# Patient Record
Sex: Male | Born: 1950 | Race: White | Hispanic: No | Marital: Married
Health system: Midwestern US, Community
[De-identification: ages and names within clinical notes are randomized; demographics above are authoritative.]

## PROBLEM LIST (undated history)

## (undated) DIAGNOSIS — M549 Dorsalgia, unspecified: Secondary | ICD-10-CM

## (undated) DIAGNOSIS — M545 Low back pain, unspecified: Secondary | ICD-10-CM

## (undated) DIAGNOSIS — S4991XA Unspecified injury of right shoulder and upper arm, initial encounter: Secondary | ICD-10-CM

## (undated) DIAGNOSIS — M1712 Unilateral primary osteoarthritis, left knee: Secondary | ICD-10-CM

## (undated) DIAGNOSIS — R079 Chest pain, unspecified: Secondary | ICD-10-CM

## (undated) DIAGNOSIS — E039 Hypothyroidism, unspecified: Secondary | ICD-10-CM

## (undated) DIAGNOSIS — M75100 Unspecified rotator cuff tear or rupture of unspecified shoulder, not specified as traumatic: Secondary | ICD-10-CM

## (undated) DIAGNOSIS — M25511 Pain in right shoulder: Secondary | ICD-10-CM

## (undated) DIAGNOSIS — N139 Obstructive and reflux uropathy, unspecified: Secondary | ICD-10-CM

---

## 2011-05-03 NOTE — ED Notes (Signed)
Pt has fever  Diarrhea  Right lower abd pain  achey all over sincwe Thursday no appetite. Pt placed in room 5

## 2011-05-03 NOTE — ED Notes (Signed)
Patient A+Ox3 in NAD, reporting pain in RLQ and diarrhea since Thursday. IV access established and pt medicated as ordered. IVF infusing. CXR done. Patient drinking PO contrast.

## 2011-05-03 NOTE — ED Provider Notes (Signed)
HPI Comments: RLQ Abd pain assoc with diarrhea    Patient is a 60 y.o. male presenting with fever. The history is provided by the patient.   Fever   This is a new problem. The current episode started 2 days ago. The problem has not changed since onset.Patient reports a subjective fever - was not measured.Associated symptoms include diarrhea, muscle aches and shortness of breath. Pertinent negatives include no chest pain, no vomiting, no congestion, no headaches, no sore throat, no cough, no neck pain, no rash and no urinary symptoms. He has tried acetaminophen and ibuprofen for the symptoms. The treatment provided no relief.        Past Medical History   Diagnosis Date   ??? CAD (coronary artery disease)      cholesterol        Past Surgical History   Procedure Date   ??? Hx urological    ??? Hx prostatectomy          No family history on file.     History     Social History   ??? Marital Status: Married     Spouse Name: N/A     Number of Children: N/A   ??? Years of Education: N/A     Occupational History   ??? Not on file.     Social History Main Topics   ??? Smoking status: Never Smoker    ??? Smokeless tobacco: Not on file   ??? Alcohol Use: Yes      soc   ??? Drug Use: No   ??? Sexually Active:      Other Topics Concern   ??? Not on file     Social History Narrative   ??? No narrative on file                  ALLERGIES: Review of patient's allergies indicates no known allergies.      Review of Systems   Constitutional: Positive for fever. Negative for chills and unexpected weight change.   HENT: Negative for congestion, sore throat, rhinorrhea, neck pain and neck stiffness.    Eyes: Negative for pain, redness and visual disturbance.   Respiratory: Positive for shortness of breath. Negative for cough and wheezing.    Cardiovascular: Negative for chest pain and palpitations.   Gastrointestinal: Positive for abdominal pain and diarrhea. Negative for nausea, vomiting and blood in stool.    Genitourinary: Negative for dysuria, hematuria and flank pain.   Musculoskeletal: Negative for back pain.   Skin: Negative for pallor and rash.   Neurological: Negative for syncope, light-headedness and headaches.   Psychiatric/Behavioral: Negative for confusion.       Filed Vitals:    05/03/11 1917   BP: 136/64   Pulse: 98   Temp: 101 ??F (38.3 ??C)   Resp: 18   Height: 5\' 9"  (1.753 m)   Weight: 63.504 kg (140 lb)   SpO2: 97%            Physical Exam   Nursing note and vitals reviewed.  Constitutional: He is oriented to person, place, and time. He appears well-developed and well-nourished. No distress.   HENT:   Head: Normocephalic and atraumatic.   Mouth/Throat: Oropharynx is clear and moist.   Eyes: EOM are normal. Pupils are equal, round, and reactive to light.   Neck: Neck supple. No JVD present.   Cardiovascular: Normal rate, regular rhythm and normal heart sounds.    No murmur heard.  Pulmonary/Chest: Breath sounds  normal. No respiratory distress. He has no wheezes. He has no rales.   Abdominal: Soft. Bowel sounds are normal. He exhibits no distension. There is tenderness (RLQ). There is guarding. There is no rebound.   Genitourinary: Rectum normal. Guaiac negative stool.   Musculoskeletal: Normal range of motion. He exhibits no edema and no tenderness.   Neurological: He is alert and oriented to person, place, and time. No cranial nerve deficit.   Skin: Skin is warm and dry. No rash noted. No pallor.   Psychiatric: He has a normal mood and affect.        MDM    Procedures    <EMERGENCY DEPARTMENT CASE SUMMARY>    Impression/Differential Diagnosis: Fever,Abd pain and Diarrhea    Plan: IVF/cultures/labs and CT Abd/Pelvis    ED Course: persistent pain RLQ  Labs reviewed:noted mild dehydration with elevated urine sp grav.  CT reveals sl thickening of Appendix and terminal Ileum without inflammation  12:30AM case discussed with Dr. Monna Fam recommends admit to med for observation     Final Impression/Diagnosis: Abdominal Pain/Diarrhea    Patient condition at time of disposition: guarded      I have reviewed the following home medications:    Prior to Admission medications    Medication Sig Start Date End Date Taking? Authorizing Provider   tamsulosin (FLOMAX) 0.4 mg capsule Take 0.4 mg by mouth daily.     Yes Phys Other, MD   acetaminophen (TYLENOL EXTRA STRENGTH) 500 mg tablet Take 1,000 mg by mouth every six (6) hours as needed.     Yes Phys Other, MD         Mikle Bosworth, MD

## 2011-05-03 NOTE — ED Notes (Signed)
Pt returned from ct  Second litre of n/s hung and infusing 150cc/hr. Pt feels a little better, trying to sleep in room 5

## 2011-05-03 NOTE — ED Notes (Signed)
Patient A+Ox3 in NAD, reports feeling less pain but still uncomfortable. Steady gait to bathroom.

## 2011-05-04 ENCOUNTER — Inpatient Hospital Stay
Admit: 2011-05-04 | Discharge: 2011-05-05 | Disposition: A | Discharge status: Home / Self Care | Source: Home / Self Care

## 2011-05-04 DIAGNOSIS — R1031 Right lower quadrant pain: Secondary | ICD-10-CM

## 2011-05-04 LAB — METABOLIC PANEL, BASIC
Anion gap: 5 mmol/L — ABNORMAL LOW (ref 10–20)
BUN: 22 mg/dL — ABNORMAL HIGH (ref 7–20)
CO2: 27 mmol/L (ref 21–32)
Calcium: 7.6 mg/dL — ABNORMAL LOW (ref 8.5–10.1)
Chloride: 101 mmol/L (ref 98–107)
Creatinine: 0.9 mg/dL (ref 0.6–1.3)
GFR est AA: >60 mL/min/{1.73_m2} (ref >60)
GFR est non-AA: >60 mL/min/{1.73_m2} (ref >60)
Glucose: 114 mg/dL — ABNORMAL HIGH (ref 70–100)
Potassium: 4.4 mmol/L (ref 3.5–5.1)
Sodium: 133 mmol/L — ABNORMAL LOW (ref 136–145)

## 2011-05-04 LAB — URINALYSIS W/O MICRO
Glucose: NEGATIVE MG/DL
Ketone: 80 MG/DL — AB
Leukocyte Esterase: NEGATIVE
Nitrites: NEGATIVE
Specific gravity: >1.03 — ABNORMAL HIGH (ref 1.005–1.030)
Urobilinogen: 0.2 EU/DL (ref 0.1–1.0)
pH (UA): 5 (ref 4.5–8.0)

## 2011-05-04 LAB — CBC WITH AUTOMATED DIFF
ABS. EOSINOPHILS: 0.1 10*3/uL
ABS. LYMPHOCYTES: 0.6 10*3/uL
ABS. LYMPHOCYTES: 1.2 10*3/uL
ABS. MONOCYTES: 0.5 10*3/uL
ABS. MONOCYTES: 0.3 10*3/uL
ABS. NEUTROPHILS: 5.2 10*3/uL
ABS. NEUTROPHILS: 3.9 10*3/uL
ATYPICAL LYMPHS: 1 %
BAND NEUTROPHILS: 6 % (ref 0–7)
BAND NEUTROPHILS: 32 % — ABNORMAL HIGH (ref 0–7)
EOSINOPHILS: 1 % (ref 0–2)
HCT: 41 % — ABNORMAL LOW (ref 42.0–52.0)
HCT: 34.4 % — ABNORMAL LOW (ref 42.0–52.0)
HGB: 14.5 g/dL (ref 14.0–18.0)
HGB: 12.1 g/dL — ABNORMAL LOW (ref 14.0–18.0)
LYMPHOCYTES: 9 % — ABNORMAL LOW (ref 21–51)
LYMPHOCYTES: 21 % (ref 21–51)
MCH: 30.9 PG (ref 27.0–31.0)
MCH: 30.9 PG (ref 27.0–31.0)
MCHC: 35.4 g/dL (ref 30.5–36.0)
MCHC: 35.2 g/dL (ref 30.5–36.0)
MCV: 87.4 FL (ref 80.0–96.0)
MCV: 88 FL (ref 80.0–96.0)
MONOCYTES: 8 % (ref 2–9)
MONOCYTES: 6 % (ref 2–9)
MPV: 9.3 FL — ABNORMAL LOW (ref 10.2–12.7)
MPV: 9.2 FL — ABNORMAL LOW (ref 10.2–12.7)
NEUTROPHILS: 76 % — ABNORMAL HIGH (ref 42–75)
NEUTROPHILS: 40 % — ABNORMAL LOW (ref 42–75)
PLATELET ESTIMATE: ADEQUATE
PLATELET ESTIMATE: ADEQUATE
PLATELET: 151 10*3/uL (ref 122–400)
PLATELET: 127 10*3/uL (ref 122–400)
RBC: 4.69 M/uL — ABNORMAL LOW (ref 4.70–6.10)
RBC: 3.91 M/uL — ABNORMAL LOW (ref 4.70–6.10)
RDW: 12.6 % (ref 11.4–14.6)
RDW: 12.5 % (ref 11.4–14.6)
WBC: 6.4 10*3/uL (ref 4.8–10.8)
WBC: 5.4 10*3/uL (ref 4.8–10.8)

## 2011-05-04 LAB — MAGNESIUM: Magnesium: 1.8 mg/dL (ref 1.8–2.4)

## 2011-05-04 LAB — PROTHROMBIN TIME + INR
INR: 1 (ref 0.8–1.2)
Prothrombin time: 10.6 s (ref 9.6–11.0)

## 2011-05-04 LAB — METABOLIC PANEL, COMPREHENSIVE
A-G Ratio: 0.9 — ABNORMAL LOW (ref 1.0–1.5)
ALT (SGPT): 45 U/L (ref 30–65)
AST (SGOT): 23 U/L (ref 15–37)
Albumin: 3.7 g/dL (ref 3.4–5.0)
Alk. phosphatase: 80 U/L (ref 50–136)
Anion gap: 10 mmol/L (ref 10–20)
BUN: 30 mg/dL — ABNORMAL HIGH (ref 7–20)
Bilirubin, total: 0.7 mg/dL (ref 0.2–1.0)
CO2: 26 mmol/L (ref 21–32)
Calcium: 9.1 mg/dL (ref 8.5–10.1)
Chloride: 98 mmol/L (ref 98–107)
Creatinine: 1 mg/dL (ref 0.6–1.3)
GFR est AA: >60 mL/min/{1.73_m2} (ref >60)
GFR est non-AA: >60 mL/min/{1.73_m2} (ref >60)
Globulin: 4.1 g/dL (ref 2.5–5.0)
Glucose: 95 mg/dL (ref 70–100)
Potassium: 4.2 mmol/L (ref 3.5–5.1)
Protein, total: 7.8 g/dL (ref 6.4–8.2)
Sodium: 133 mmol/L — ABNORMAL LOW (ref 136–145)

## 2011-05-04 LAB — PROTEIN, CONFIRM

## 2011-05-04 LAB — LACTIC ACID: Lactic acid: 0.6 MMOL/L (ref 0.4–2.0)

## 2011-05-04 LAB — LIPASE: Lipase: 120 U/L (ref 73–393)

## 2011-05-04 LAB — URINE MICROSCOPIC

## 2011-05-04 LAB — BILIRUBIN, CONFIRM: Bilirubin UA, confirm: NEGATIVE

## 2011-05-04 LAB — PTT: aPTT: 30.3 s (ref 24.3–32.2)

## 2011-05-04 LAB — WBC, STOOL: White blood cells, stool: NONE SEEN /HPF

## 2011-05-04 LAB — EKG, 12 LEAD, INITIAL: Date EKG performed:: 81212

## 2011-05-04 MED ORDER — KETOROLAC TROMETHAMINE 30 MG/ML INJECTION
30 mg/mL (1 mL) | INTRAMUSCULAR | Status: AC
Start: 2011-05-04 — End: 2011-05-04

## 2011-05-04 NOTE — Consults (Unsigned)
[facility]  CONSULTATION REPORT                                            Name:  Joel Graham, Joel Graham                                            [facility]:  [id number]                                            Account #:  [id number]                                            Date of Admission:  05/04/2011      Facility [facility]  DocId [id number]  ChartScript-[id number]      DATE:  05/04/2011.    CHIEF COMPLAINT:  Right lower quadrant pain and diarrhea.    HISTORY OF PRESENT ILLNESS:  The patient is a 60 year old gentleman,  presented with a fever, abdominal pain, diarrhea starting on Thursday  along with a sore throat and general malaise. He began having pain in  his right lower quadrant that started sometime after that with  persistent diarrhea and stools, having over 10 bowel movements per  day. His stool was found to be occult blood positive at the hospital.  The pain has moved to be laterally on the right lower quadrant of the  abdomen to deep palpation. The rest of his abdomen is benign on exam.    PAST MEDICAL HISTORY:  He has prostatic hypertrophy.    PAST SURGICAL HISTORY:  Significant for carpal tunnel surgery as well  as a knee surgery.    ALLERGIES:  NO KNOWN DRUG ALLERGIES.    SOCIAL HISTORY:  The patient denies alcohol or tobacco or drug use.    CURRENT MEDICATIONS  1. Tylenol.  2. Flomax.  3. Avodart.  4. Also takes Ambien.    PHYSICAL EXAMINATION  VITAL SIGNS:  Currently, blood pressure of 118/56. His temperature is  98.8, down slightly, and heart rate in the 70s.  GENERAL:  He is resting, in no apparent distress. The patient does  state that the pain is not near as bad when he is not moving that  much. The patient is alert, oriented, following commands with cranial  nerve function intact.  NECK:  Supple with no masses palpated. The patient has no jaundice.  LUNGS:  Clear.  HEART:  Regular rate and rhythm.   ABDOMEN:  Soft with tenderness to palpation on the right side, but  negative Rovsing sign.    On CAT scan, he was seen to have some generalized inflammation of the  distal small bowel and potentially some thickening of the appendix.  However, his white count is only 6.4 and his urine does have 1+  bacteria with a few casts. Coags are normal. Stool cultures are  pending. He did have a shift of 76% neutrophils and 6% bands, and 9%  lymphocytes and the metabolic panel is essentially within normal  limits.  IMPRESSION:  Inflammation terminal small bowel. With the amount of  diarrhea, it is likely to be bacterial enteritis. I will continue  treating with antibiotics, but it is warranted to continue  observation; if it gets worse, to evaluate for appendicitis. The  patient does have colonoscopy that removed 2 polyps in March so it is  unlikely that despite the occult blood positivity that the patient  has a mass that is involved. I would continue hydration, antibiotics,  bowel rest and see how he does over the next 24 hours. If the pain  improves, he could probably be sent home on p.o. antibiotics. If it  does not, if worse, get a gastroenterology consult and I will see him  again tomorrow if he is still in the hospital.        Johnanna Schneiders MD  [id number]  D:  05/04/2011   11:54  T:  05/04/2011   12:28  Job #:  [id number]            wm  wmwm  CS Doc#:  [id number]                                    Page 1 of 2

## 2011-05-04 NOTE — H&P (Unsigned)
[facility]  HISTORY   PHYSICAL                                            Name:  Joel Graham, Joel Graham                                            [facility]:  [id number]                                            Account #:  [id number]                                            Date of Admission:  05/04/2011      Facility [facility]  DocId [id number]  ChartScript-[id number]      ADMITTING PHYSICIAN:  Dr. Lennart Pall.    PRIMARY CARE PHYSICIAN:  Dr. Herbie Saxon.    HISTORY OF PRESENT ILLNESS:  A 60 year old gentleman presented with  fever, abdominal pain, and diarrhea. Four days ago, sore throat,  chills, sweaty, and fever, generalized muscle and joint pain started.  Two days after that abdominal pain localized in the right lower  quadrant with vomiting and diarrhea. Diarrhea progressively  increased. Yesterday, patient had more than 10 bowel movements. He did  not see mucus or blood in the stools. Dehydrated with absent  appetite, generalized weakness. Also complaining of headache. No  chest pain. No shortness of breath.    PAST MEDICAL HISTORY:  Negative.    PAST SURGICAL HISTORY:  Carpal tunnel surgery.    ALLERGIES:  THE PATIENT DENIES ALLERGY TO MEDICATIONS AND FOOD.    SOCIAL HISTORY:  The patient denies alcohol, tobacco, and drug abuse.    According to the chart, the patient has coronary artery disease, also  prostatectomy.    HOME MEDICATIONS  1. Tylenol.  2. Flomax.    FAMILY HISTORY:  No family medical history.    PHYSICAL EXAMINATION  VITAL SIGNS:  Temperature is 101, pulse 98, respiratory rate 18,  blood pressure 136/64, saturation 97% on room air.  GENERAL:  The patient is alert, oriented, follows commands. Complaining  of generalized weakness, headache. No visual or hearing problems. No  sore throat, no chest pain, no shortness of breath. Right lower  quadrant abdominal pain. No radiation. No dysuria. Diffuse muscle and   joint pain. No skin rash. No JVD. No lymphadenopathy. thyroid not  palpated.  ENT/NECK:  Dry. No exudate. Neck supple. No jaundice. No conjunctivitis.  LUNGS:  Clear on auscultation and percussion.  HEART:  Regular S1, S2, tachycardia, no murmur.  ABDOMEN:  Soft. Periumbilical and right lower quadrant abdominal  pain. No rigidity. No dullness. No masses palpable. No bruits.  LEGS:  No edema. No cyanosis or DVT. No joint deformity.  SKIN:  No skin rash. Symmetric peripheral pulses.  No neurological  deficit. No decubitus ulcer.    LABORATORY DATA:  Sodium is 133, potassium is 4.2, bicarbonate is 26,  BUN 30, creatinine 1, blood sugar 95, calcium is 9.1. Liver function  test  is normal. Lipase normal. INR 1.0. PT 10.6. White blood cells 6,  hemoglobin 15, hematocrit 41, platelets 151. There is no neutrophilia.  Urinalysis, there is a ketonuria. ________negative. Red blood cells 5.  White blood cells none. Specific gravity more than 1.030.    CAT scan of the abdomen and pelvis showed clear lungs. Appendix  visualized, edematous, 7 mm diameter. No surrounding inflammation.  There is thickening of the terminal ileum.    IMPRESSION:  Thickening of the appendix, diameter 7 mm. Rule out  appendicitis, infectious or inflammatory terminal ileum.    ASSESSMENT AND PLAN  1. Infectious terminal ileitis involving the appendix. ER physician  consulted surgeon, Dr. ________who is going to evaluate the patient's  abdomen for acute appendicitis. No signs of peritonitis. No history of  diarrhea, mal-obstruction so Crohn disease unlikely. If this diarrhea  continues in the future, the patient needs Gastroenterology consult  to rule out Crohn disease. The patient needs a small bowel contrast  study followthrough, small bowel followthrough, or MRI. Also may need  esophagogastroduodenoscopy and colonoscopy. The patient does not have  an extraintestinal symptoms or signs of Crohn disease. Requested ESR,   CRP, protein and stools for ova and parasites, culture, white blood  cells, and fecal occult blood. I started the patient on Flagyl and  Levaquin intravenous.    2. Dehydration. I gave the patient normal saline intravenously and  p.o. fluids as tolerated.    3. Peptic ulcer disease prophylaxis with omeprazole. Deep venous  thrombosis prophylaxis with Lovenox.    4. For pain, I will give the patient morphine.    The patient will be admitted to the medical floor. Activity as tolerated.  Walk with assistance because of dehydration, gait of the patient was  unstable. Diet, full liquid. I explained to the patient diagnosis and  treatment plan.        Carol Ada MD  [id number]  D:  05/04/2011   02:03  T:  05/04/2011   10:52  Job #:  [id number]          wm  wmwm  CS Doc#:  [id number]                                    Page 1 of 3

## 2011-05-04 NOTE — ED Notes (Signed)
Patient A+Ox3 in NAD, c/o pain again, diarrhea, nausea. Medicated as ordered. Patient febrile at 102.9 orally; medicated PO as ordered. Admission orders from Dr. Lennart Pall noted and scanned. IV Levaquin infusing.

## 2011-05-04 NOTE — ED Notes (Signed)
TRANSFER - OUT REPORT:    Verbal report given to [facility] RN on Joel Graham  being transferred to 1st floor for routine progression of care       Report consisted of patient???s Situation, Background, Assessment and   Recommendations(SBAR).     Information from the following report(s) SBAR was reviewed with the receiving nurse.    Opportunity for questions and clarification was provided.      LDA removed in [facility] for documentation purposes only.  Patient admitted to hospital with; site 1- Peripheral IV, which at time of admission is Clean, Dry, and intact, no signs or symptoms of phlebitis. No signs or symptoms of infiltration and Patent.

## 2011-05-05 LAB — METABOLIC PANEL, BASIC
Anion gap: 8 mmol/L — ABNORMAL LOW (ref 10–20)
BUN: 12 mg/dL (ref 7–20)
CO2: 27 mmol/L (ref 21–32)
Calcium: 8.2 mg/dL — ABNORMAL LOW (ref 8.5–10.1)
Chloride: 103 mmol/L (ref 98–107)
Creatinine: 0.8 mg/dL (ref 0.6–1.3)
GFR est AA: >60 mL/min/{1.73_m2} (ref >60)
GFR est non-AA: >60 mL/min/{1.73_m2} (ref >60)
Glucose: 101 mg/dL — ABNORMAL HIGH (ref 70–100)
Potassium: 3.8 mmol/L (ref 3.5–5.1)
Sodium: 138 mmol/L (ref 136–145)

## 2011-05-05 LAB — CBC W/O DIFF
HCT: 36.1 % — ABNORMAL LOW (ref 42.0–52.0)
HGB: 12.7 g/dL — ABNORMAL LOW (ref 14.0–18.0)
MCH: 31.2 PG — ABNORMAL HIGH (ref 27.0–31.0)
MCHC: 35.2 g/dL (ref 30.5–36.0)
MCV: 88.7 FL (ref 80.0–96.0)
MPV: 9.1 FL — ABNORMAL LOW (ref 10.2–12.7)
PLATELET: 149 10*3/uL (ref 122–400)
RBC: 4.07 M/uL — ABNORMAL LOW (ref 4.70–6.10)
RDW: 12.6 % (ref 11.4–14.6)
WBC: 5 10*3/uL (ref 4.8–10.8)

## 2011-05-05 LAB — POC OCCULT BLOOD,STOOL
Occult blood, stool: POSITIVE
Occult blood, stool: POSITIVE

## 2011-05-05 LAB — CULTURE, URINE
Culture result:: NO GROWTH
Culture: NO GROWTH

## 2011-05-06 LAB — OVA & PARASITES, STOOL

## 2011-05-06 LAB — CULTURE, STOOL

## 2011-05-06 LAB — OVA & PARASITES, STOOL, REFLEX

## 2011-05-06 NOTE — Discharge Summary (Unsigned)
[facility]  DISCHARGE SUMMARY                                            Name: Joel Graham, Joel Graham                                            MR#: [id number]                                            Account #: [id number]                                            Date of Adm: 05/04/2011      Facility [facility]  DocId [id number]  ChartScript-[id number]      PRIVATE MEDICAL DOCTOR:  Marylynn Pearson, MD    CONSULTATIONS DONE DURING THIS HOSPITALIZATION:  Surgery, Johnanna Schneiders, MD    DISCHARGE DIAGNOSES  1. Enteritis.  2. Benign prostatic hypertrophy.  3. Mild dehydration.    DISCHARGE MEDICATIONS  1. Flagyl 500 mg 3 times a day for 5 days.  2. Cipro 500 mg twice a day for 5 more days.    LABORATORY DATA:  White count is 5, hemoglobin is 12.7, hematocrit is  36.1, platelet count is 149,000. Sodium is 138, potassium is 3.8,  chloride is 103, bicarbonate of 27, BUN is 12, creatinine is 0.8,  glucose is 101. Fecal occult test blood was positive. Stool was  negative for WBCs, total bilirubin was 0.7, calcium was 9.1. ALT was  45, AST was 23, alkaline phosphatase was 80, total protein was 7.8.  Albumin is 3.7, globulin is 4.1, magnesium is 1.8. Lactic acid was  0.6. Lipase was 120. GFR was greater than 60. INR was 1. Urinalysis  was yellow and clear with a specific gravity of 1.030. There was  trace for blood and negative nitrates and leukocytes. There were a  few and hyaline casts, bacteria 1+. Urine culture was no growth.  Blood cultures were negative. A CAT scan of the abdomen revealed no  evidence of small bowel obstruction, no free air within the abdomen.  The appendix was visualized and appeared to be slightly thickened,  measuring 7 mm. No significant surrounding inflammation was seen.  There was mild thickening of the terminal ileum and a moderate to  large amount of stool was present within the rectosigmoid colon.   Clinical correlation for infectious or inflammatory enteritis was  suggested. The chest x-ray was negative. The EKG showed a normal sinus  rhythm, normal axis, no ST-T changes.    HOSPITAL COURSE:  The patient was initially admitted for rule out  appendicitis. The CAT scan was inconsistent with an appendicitis and  because the CAT scan showed enteritis, the patient was placed on  antibiotics and blood cultures were sent. The blood cultures returned  negative. The urine cultures were negative. The patient was started  on Cipro and Flagyl. He was seen by surgery who thought that there  was inflammation of the terminal small bowel and therefore,  the  patient had a bacterial enteritis. He suggested to continue to  observe the patient for an additional 24 hours and if he gets worse,  to reevaluate for appendicitis. The patient was kept in the hospital  for observation 24 additional hours and was able to eat regular food.  He had no abdominal pain and the diarrhea resolved. He was not  orthostatic by blood pressure or heart rate. He recently had a  colonoscopy which revealed a couple of polyps and this was done in  March of 2012. The polyps were removed and so surgery, thought that  even though the patient was occult blood positive that it was  unlikely that he needed to have repeat colonoscopy. It was the  suggestion of the surgeon to continue hydration, antibiotics, bowel  rest and see how he does over the next 24 hours. The pain improved  and the patient did not have any pain on discharge.    CONDITION ON DISCHARGE:  Stable and improved without any abdominal  pain, nausea, vomiting, diarrhea, and eating regular diet.    DISPOSITION:  The patient was told to take his antibiotics for an  additional 5 days. He was also told to followup with his private MD.        Mardene Sayer MD  [id number]  D:  05/05/2011   13:35  T:  05/06/2011   07:42  Job #:  [id number]            wm    CS Doc#:  [id number]                                       Page 1 of 2

## 2011-05-09 LAB — CULTURE, BLOOD
Culture result:: NO GROWTH
Culture result:: NO GROWTH

## 2012-10-01 LAB — CBC W/O DIFF
HCT: 40.9 % — ABNORMAL LOW (ref 42.0–52.0)
HGB: 14.3 g/dL (ref 14.0–18.0)
MCH: 30.6 PG (ref 27.0–31.0)
MCHC: 35 g/dL (ref 30.5–36.0)
MCV: 87.4 FL (ref 80.0–96.0)
MPV: 9.2 FL — ABNORMAL LOW (ref 10.2–12.7)
PLATELET: 164 10*3/uL (ref 122–400)
RBC: 4.68 M/uL — ABNORMAL LOW (ref 4.70–6.10)
RDW: 12.1 % (ref 11.4–14.6)
WBC: 4.5 10*3/uL — ABNORMAL LOW (ref 4.8–10.8)

## 2012-10-01 LAB — EKG, 12 LEAD, INITIAL: Date EKG performed:: 11014

## 2012-10-08 NOTE — Op Note (Unsigned)
[facility]  OPERATIVE REPORT                                            Name: Joel Graham, Joel Graham                                            MR#: [id number]                                            Account #: [id number]                                            Date of Adm: 10/08/2012        Facility [facility]  DocId [id number]  ChartScript-[id number]    DATE OF SURGERY:  10/08/2012    SURGEON:  Reine Just, MD    ASSISTANT:  Frederico Hamman, PA-C    PREOPERATIVE DIAGNOSIS:  Right knee medial meniscal tear.    POSTOPERATIVE DIAGNOSIS:  Right knee medial meniscal tear.    PROCEDURE PERFORMED:  1. Right knee arthroscopy.  2. Partial medial meniscectomy.    ANESTHESIOLOGIST:  Garret Reddish, MD    ANESTHESIA:  General LMA.    FLUIDS:  Lactated Ringer 500 mL.    ESTIMATED BLOOD LOSS:  Minimal.    INDICATIONS:  The patient is a 62 year old male with pain in the  right knee, medial joint line tenderness. Options reviewed for  nonsurgical versus surgical intervention. With MRI evaluation, medial  meniscal tear identified. Medication, activity modification with  continued symptoms. Arthroscopy risks include, but not limited to,  bleeding, infection, nerve damage, stiffness, DVT. The patient opts  for surgical intervention.    DATE OF OPERATION:  The patient was taken to the operating room.  After induction of general LMA anesthetic, he was placed supine on  the operating room table. Left leg had a Venodyne placed. A  tourniquet was placed on the right leg. It was not utilized for the  case. The right leg was prepped and draped for right knee  arthroscopy. Timeout was completed, confirming the right side as the  appropriate side. Antibiotics were confirmed and infused.    An inferolateral portal was created with an 11 blade. The scope was placed    inferolaterally, and the knee was placed in extension. Suprapatellar  pouch, medial and lateral gutters were visualized. There were  no  loose bodies. Patellofemoral joint had diffuse grade 2 chondrosis.  The medial compartment was visualized. An additional medial portal was  initiated with a spinal needle followed by an 11 blade. Posterior  horn medial meniscal tear was visualized and debrided to a stable  border with the ArthroWand and sucker shaver. Radial and horizontal  cleavage component was identified. The meniscosynovial junction  posteriorly was visualized and intact. ACL was visualized and intact.  The lateral compartment was visualized, with no lateral meniscal tear  identified and no articular cartilage pathology identified. Danielle  assisted in the case with valgus positioning of the knee for  visualizing the medial compartment  and the figure-of-4 positioning of  the leg to visualize the lateral compartment.    Then, 3-0 nylon was used to close the portals in a simple interrupted  fashion. Then, 30 mL of 1% lidocaine with epinephrine and 10 mg of  morphine were placed into the right knee. The patient is awaiting  extubation, in stable condition for right knee arthroscopy. The wound  will be dressed with Xeroform, 4 x 4, ABD, Kerlix.        Reine Just MD   [id number]  D:  10/08/2012   12:18  T:  10/08/2012   20:12  Job #:  [id number]              wm    CS Doc#:  [id number]                                    Page 1 of 2

## 2013-06-11 NOTE — ED Notes (Signed)
Ace to left thigh.

## 2013-06-11 NOTE — ED Provider Notes (Signed)
HPI Comments: PT BENT OVER TO PICK SOMETHING UP AND FELT A POP IN THE MIDDLE OF THE BACK OF HIS LEFT UPPER LEG, DENIES TRAUMA, NO HX/RISK  OF PE/DVT NO CALF PAIN OR SWELLING, NO CP, SOB,     Patient is a 62 y.o. male presenting with leg pain. The history is provided by the patient.   Leg Pain   This is a new problem. The current episode started 1 to 2 hours ago. The pain is present in the left upper leg. The quality of the pain is described as aching and constant. The pain is at a severity of 8/10. The pain is moderate. Associated symptoms include stiffness. Pertinent negatives include no numbness, full range of motion, no tingling, no itching, no back pain and no neck pain. The symptoms are aggravated by movement, palpation and standing. He has tried nothing for the symptoms. There has been no history of extremity trauma.        Past Medical History   Diagnosis Date   ??? CAD (coronary artery disease)      cholesterol        Past Surgical History   Procedure Laterality Date   ??? Hx urological     ??? Hx prostatectomy           History reviewed. No pertinent family history.     History     Social History   ??? Marital Status: MARRIED     Spouse Name: N/A     Number of Children: N/A   ??? Years of Education: N/A     Occupational History   ??? Not on file.     Social History Main Topics   ??? Smoking status: Never Smoker    ??? Smokeless tobacco: Not on file   ??? Alcohol Use: Yes      Comment: soc   ??? Drug Use: No   ??? Sexually Active:      Other Topics Concern   ??? Not on file     Social History Narrative   ??? No narrative on file                  ALLERGIES: Review of patient's allergies indicates no known allergies.      Review of Systems   Constitutional: Negative.  Negative for fever.   HENT: Negative.  Negative for neck pain.    Eyes: Negative.    Respiratory: Negative.  Negative for cough and shortness of breath.    Cardiovascular: Negative.  Negative for chest pain.   Gastrointestinal: Negative.  Negative for abdominal pain.    Genitourinary: Negative.    Musculoskeletal: Positive for gait problem and stiffness. Negative for back pain.   Skin: Negative.  Negative for itching and rash.   Neurological: Negative.  Negative for tingling and numbness.   Psychiatric/Behavioral: Negative.        Filed Vitals:    06/11/13 1230   BP: 182/106   Pulse: 84   Temp: 98.2 ??F (36.8 ??C)   Resp: 18   Height: 5\' 9"  (1.753 m)   Weight: 68.04 kg (150 lb)   SpO2: 98%            Physical Exam   Nursing note and vitals reviewed.  Constitutional: He is oriented to person, place, and time. He appears well-developed and well-nourished.   HENT:   Head: Normocephalic and atraumatic.   Eyes: Conjunctivae are normal. Pupils are equal, round, and reactive to light.   Neck:  Normal range of motion. Neck supple.   Cardiovascular: Normal rate, regular rhythm and normal heart sounds.    Pulmonary/Chest: Effort normal and breath sounds normal.   Abdominal: Soft. Bowel sounds are normal. There is no tenderness.   Musculoskeletal: Normal range of motion.   TTP MID SUBSTANCE LEFT HAMSTRING, NO BRUISING OR SWELLING NOTED, HIP AND KNEE NON TENDER, DNVI, PAIN WITH RESISTED ACTIVE FLEXION OF KNEE. ABLE TO DO SLR   Neurological: He is alert and oriented to person, place, and time.   Skin: Skin is warm and dry.        MDM    Procedures    HAMSTRING STRAIN, PT DROVE HERE, WANTS TO DRIVE HOME, PERCOCET PRN, ACE ICE RICE, HAS CRUTCHES AT HOME DOES NOT WANT ANOTHER PAIR  WBAT   F/U ORTHO  RTC IF WORSE      <EMERGENCY DEPARTMENT CASE SUMMARY>    Impression/Differential Diagnosis: HAMSTRING TEAR VS RUPTURE    Plan: EXAM    ED Course: UNREMARKABLE    Final Impression/Diagnosis: LEFT HAMSTRING STRAIN INITIAL ENCOUNTER    Patient condition at time of disposition: STABLE      I have reviewed the following home medications:    Prior to Admission medications    Medication Sig Start Date End Date Taking? Authorizing Provider   aspirin delayed-release 81 mg tablet Take  by mouth daily.   Yes Phys  Other, MD   rosuvastatin (CRESTOR) 20 mg tablet Take 20 mg by mouth nightly.   Yes Phys Other, MD   levothyroxine (SYNTHROID) 50 mcg tablet Take  by mouth Daily (before breakfast).   Yes Phys Other, MD   finasteride (PROSCAR) 5 mg tablet Take 5 mg by mouth daily.   Yes Phys Other, MD   zolpidem (AMBIEN) 10 mg tablet Take  by mouth nightly as needed for Sleep.   Yes Phys Other, MD   tamsulosin (FLOMAX) 0.4 mg capsule Take 0.4 mg by mouth daily.     Yes Phys Other, MD         Andreas Blower, MD

## 2013-06-11 NOTE — ED Notes (Signed)
Patient is awake, alert, and oriented, speech is clear and patient is able to ambulate (if applicable) and ready for discharge. Verbal and written discharge instructions provided and has the cognitive understanding of discharge instructions. Discharged home with family. All questions answered. rx given to pts wife   Pt discharged with isnt sheets   sts has crutches at home  Ace wrap  Put on his knee   amb out of er with spouse   Refused wheelchair

## 2013-06-11 NOTE — ED Notes (Signed)
Pt sts he bent over to pick up a rake and felt something pop in back of left leg   Pt sts he was fine before that, took a walk in the morning  Etc   sts no hx of any leg problems

## 2014-09-13 ENCOUNTER — Encounter

## 2014-09-19 ENCOUNTER — Encounter

## 2014-09-21 ENCOUNTER — Inpatient Hospital Stay: Admit: 2014-09-21 | Payer: PRIVATE HEALTH INSURANCE | Attending: Registered Nurse | Primary: Family Medicine

## 2014-09-21 DIAGNOSIS — E039 Hypothyroidism, unspecified: Secondary | ICD-10-CM

## 2014-09-27 ENCOUNTER — Inpatient Hospital Stay: Admit: 2014-09-27 | Payer: PRIVATE HEALTH INSURANCE | Primary: Family Medicine

## 2014-09-27 DIAGNOSIS — R0602 Shortness of breath: Secondary | ICD-10-CM

## 2014-09-27 NOTE — Procedures (Signed)
DATE: 09/27/2014    INDICATIONS: Shortness of breath on exertion.    FINDINGS: Baseline heart rate 82. Baseline EKG is normal sinus rhythm  with no ST-T wave changes. Baseline blood pressure is 150/80. The  patient exercised according to Bruce protocol, exercised for 9  minutes and 0 seconds, achieving a max heart rate of 144, corresponds  to 91% of maximum predicted heart rate, achieving was 10.1 metabolic  equivalents. Of note, the blood pressure did go up to 220/90, which  is hypertensive response to exercise. The test was stopped because of  hypertensive response to exercise. The patient with averaged above  average exercise tolerance. At maximal exercise, there were no ST or  T-wave changes seen.    OVERALL CONCLUSION: Normal stress EKG. There is no evidence of  ischemia seen on the EKG. No arrhythmias noted. However, there is  hypertensive response to exercise with a blood pressure of 220/90  at 9 minutes and a blood pressure of 210/90 at 7 minutes.        Sabrinna Yearwood MD  SEH:[id number]  D:  09/27/2014   10:37  T:  09/27/2014   11:00  Job #:  [id number]

## 2014-09-27 NOTE — Procedures (Signed)
DATE: 09/27/2014    INDICATIONS: Shortness of breath on exertion.    FINDINGS: Baseline heart rate 82. Baseline EKG is normal sinus rhythm  with no ST-T wave changes. Baseline blood pressure is 150/80. The  patient exercised according to Bruce protocol, exercised for 9  minutes and 0 seconds, achieving a max heart rate of 144, corresponds  to 91% of maximum predicted heart rate, achieving was 10.1 metabolic  equivalents. Of note, the blood pressure did go up to 220/90, which  is hypertensive response to exercise. The test was stopped because of  hypertensive response to exercise. The patient with averaged above  average exercise tolerance. At maximal exercise, there were no ST or  T-wave changes seen.    OVERALL CONCLUSION: Normal stress EKG. There is no evidence of  ischemia seen on the EKG. No arrhythmias noted. However, there is  hypertensive response to exercise with a blood pressure of 220/90  at 9 minutes and a blood pressure of 210/90 at 7 minutes.        Joel CollinHURWITZ, Joel Pellegrin MD  [id number]  D:  09/27/2014   10:37  T:  09/27/2014   11:00  Job #:  [id number]

## 2015-08-02 ENCOUNTER — Inpatient Hospital Stay
Admit: 2015-08-02 | Discharge: 2015-08-02 | Disposition: A | Discharge status: Home / Self Care | Payer: PRIVATE HEALTH INSURANCE | Attending: Emergency Medicine

## 2015-08-02 ENCOUNTER — Emergency Department: Admit: 2015-08-02 | Payer: PRIVATE HEALTH INSURANCE | Primary: Family Medicine

## 2015-08-02 DIAGNOSIS — M545 Low back pain: Secondary | ICD-10-CM

## 2015-08-02 MED ORDER — DIAZEPAM 5 MG TAB
5 mg | ORAL_TABLET | Freq: Three times a day (TID) | ORAL | 0 refills | Status: DC | PRN
Start: 2015-08-02 — End: 2016-11-04

## 2015-08-02 MED ORDER — OXYCODONE-ACETAMINOPHEN 5 MG-325 MG TAB
5-325 mg | ORAL | Status: AC
Start: 2015-08-02 — End: 2015-08-02
  Administered 2015-08-02: 13:00:00 via ORAL

## 2015-08-02 MED ORDER — KETOROLAC TROMETHAMINE 30 MG/ML INJECTION
30 mg/mL (1 mL) | INTRAMUSCULAR | Status: AC
Start: 2015-08-02 — End: 2015-08-02
  Administered 2015-08-02: 13:00:00 via INTRAMUSCULAR

## 2015-08-02 MED ORDER — OXYCODONE-ACETAMINOPHEN 5 MG-325 MG TAB
5-325 mg | ORAL_TABLET | Freq: Four times a day (QID) | ORAL | 0 refills | Status: DC | PRN
Start: 2015-08-02 — End: 2018-08-27

## 2015-08-02 MED ORDER — DIAZEPAM 5 MG TAB
5 mg | Freq: Once | ORAL | Status: AC
Start: 2015-08-02 — End: 2015-08-02
  Administered 2015-08-02: 13:00:00 via ORAL

## 2015-08-02 MED ORDER — OXYCODONE-ACETAMINOPHEN 5 MG-325 MG TAB
5-325 mg | ORAL_TABLET | Freq: Four times a day (QID) | ORAL | 0 refills | Status: DC | PRN
Start: 2015-08-02 — End: 2015-08-02

## 2015-08-02 MED ORDER — DIAZEPAM 5 MG TAB
5 mg | ORAL_TABLET | Freq: Three times a day (TID) | ORAL | 0 refills | Status: DC | PRN
Start: 2015-08-02 — End: 2015-08-02

## 2015-08-02 MED FILL — DIAZEPAM 5 MG TAB: 5 mg | ORAL | Qty: 1

## 2015-08-02 MED FILL — OXYCODONE-ACETAMINOPHEN 5 MG-325 MG TAB: 5-325 mg | ORAL | Qty: 2

## 2015-08-02 MED FILL — KETOROLAC TROMETHAMINE 30 MG/ML INJECTION: 30 mg/mL (1 mL) | INTRAMUSCULAR | Qty: 1

## 2015-08-02 NOTE — ED Provider Notes (Signed)
HPI Comments: PT PRESENTS TO THE ED WITH LOW BACK PAIN THAT STARTED YESTERDAY, WORSE OVERNIGHT AND THIS MORNING. 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 NO FEVER, CHILLS, CP, SOB, ABD PAIN, NO CHANGE IN BOWEL OR BLADDER HABITS. TOOK ULTRAM W/O RELIEF LAST NIGHT. PT WITH RIGHT RIB INJURY 2 WEEKS AGO WHEN HE BENT OVER A RAILYING TRYING TO LIFT HEAVY BAG AND FELT A POP IN RIB AREA. SEEN AT [facility] WAS GIVEN RX FOR 5 PERCOCET. RIB PAIN IS IMPROIVNG, NOT SOB    Patient is a 64 y.o. male presenting with back pain. The history is provided by the patient and the spouse.   Back Pain    This is a new problem. The current episode started yesterday. The problem has been gradually worsening. The pain is associated with lifting. The pain is present in the lumbar spine. The quality of the pain is described as aching. The pain does not radiate. The pain is at a severity of 8/10. The pain is moderate. The symptoms are aggravated by bending, twisting and certain positions. The pain is the same all the time. Stiffness is present in the morning. Pertinent negatives include no chest pain, no fever, no numbness, no weight loss, no headaches, no abdominal pain, no abdominal swelling, no bowel incontinence, no perianal numbness, no bladder incontinence, no dysuria, no pelvic pain, no leg pain, no paresthesias, no paresis, no tingling and no weakness. He has tried NSAIDs for the symptoms. The patient's surgical history non-contributory        Past Medical History:   Diagnosis Date   ??? CAD (coronary artery disease)      cholesterol       Past Surgical History:   Procedure Laterality Date   ??? Hx urological     ??? Hx prostatectomy       enlarged         History reviewed. No pertinent family history.    Social History     Social History   ??? Marital status: MARRIED     Spouse name: N/A   ??? Number of children: N/A   ??? Years of education: N/A     Occupational History   ??? Not on file.     Social History Main Topics    ??? Smoking status: Former Smoker   ??? Smokeless tobacco: Former Neurosurgeon     Quit date: 08/02/1975   ??? Alcohol use Yes      Comment: soc   ??? Drug use: No   ??? Sexual activity: Not on file     Other Topics Concern   ??? Not on file     Social History Narrative         ALLERGIES: Review of patient's allergies indicates no known allergies.    Review of Systems   Constitutional: Negative.  Negative for fever and weight loss.   HENT: Negative.    Eyes: Negative.    Respiratory: Negative.  Negative for cough and shortness of breath.    Cardiovascular: Negative.  Negative for chest pain.   Gastrointestinal: Negative.  Negative for abdominal pain and bowel incontinence.   Genitourinary: Negative.  Negative for bladder incontinence, dysuria and pelvic pain.   Musculoskeletal: Positive for back pain and gait problem. Negative for joint swelling.   Skin: Negative.  Negative for rash.   Neurological: Negative for tingling, weakness, numbness, headaches and paresthesias.   Psychiatric/Behavioral: Negative.        Vitals:  08/02/15 0708   BP: 166/89   Pulse: 66   Resp: 16   Temp: 97.6 ??F (36.4 ??C)   SpO2: 97%   Weight: 63.5 kg (140 lb)   Height: 5\' 9"  (1.753 m)            Physical Exam   Constitutional: He is oriented to person, place, and time. He appears well-developed and well-nourished.   HENT:   Head: Normocephalic and atraumatic.   Eyes: Conjunctivae are normal. Pupils are equal, round, and reactive to light.   Neck: Normal range of motion. Neck supple.   NO MIDLINE BONY TENDERNESS OR STEP OFF C/T/L SPINE NO BRUISING OR ABRASIONS NOTED     Cardiovascular: Normal rate, regular rhythm and normal heart sounds.    Pulmonary/Chest: Effort normal and breath sounds normal.   Abdominal: Soft. Bowel sounds are normal. There is no tenderness.   Musculoskeletal: Normal range of motion.   TTP MID LOWER LUMBAR AROSS BACK, APPEARS NORMAL   Neurological: He is alert and oriented to person, place, and time. He has  normal strength. No sensory deficit.   Reflex Scores:       Patellar reflexes are 2+ on the right side and 2+ on the left side.       Achilles reflexes are 2+ on the right side and 2+ on the left side.  ABLE TO DO HEEL AND TOE WALK, F FLEX 30 DEGREES     Skin: Skin is warm and dry.   Nursing note and vitals reviewed.       MDM  ED Course       Procedures    Xr Spine Lumb 2 Or 3 V    Result Date: 08/02/2015  Radiographs of the lumbar spine CLINICAL INFORMATION: Low back pain. TECHNIQUE:  Frontal and lateral views of the lumbar spine and lateral coned-down  view of the lumbosacral junction were obtained. FINDINGS:  No prior studies were submitted for direct comparison. There is a shallow levoscoliosis of the lumbar spine noted.  There is straightening of normal lumbar lordosis noted, which may be secondary to muscle spasm.  The lumbar vertebral body heights and alignment are maintained.  No evidence of acute bony fracture or destructive bony lesion.  The visualized pedicles appear intact. There is mild degenerative disc disease noted at the L1/2, L3/4 and L4/5 levels.   The remaining lumbar intervertebral disk spaces are preserved. There are posterior spurring noted from the L1/2 through L5/S1 levels, with anterior osteophytes noted from the T12-L4 levels. The sacroiliac joints appear intact.     IMPRESSION:  No evidence of acute bony fracture or subluxation. Degenerative spondylosis of the lumbar spine as described above.  MR examination  would be more sensitive for evaluation of disc pathology. Straightening of the normal lumbar lordosis, which may secondary to muscle spasm. Shallow levoscoliosis of the lumbar spine. THIS DOCUMENT HAS BEEN ELECTRONICALLY SIGNED BY ALBERT CHANG MD    BETTER AFTER MED, PAIN SUBSIDING, MOVING MORE EASILY  CONTINUE NSAIDS, VALIUM AND PERCOCET AS NEEDED  ACTIVITY AND THERAPEUTIC EXERCISES AS TOLERATED  F/U PCP  RTC IF WORSE  <EMERGENCY DEPARTMENT CASE SUMMARY>     Impression/Differential Diagnosis: BACK PAIN, SCIATICA, DISC HERNIATION, MUSCLE STRAIN    Plan: Maren Reamer    ED Course: IMPROVING    Final Impression/Diagnosis:   1. Acute midline low back pain without sciatica    2. Elevated blood pressure (not hypertension)          Patient condition  at time of disposition: STABLE      I have reviewed the following home medications:    Prior to Admission medications    Medication Sig Start Date End Date Taking? Authorizing Provider   traMADol (ULTRAM) 50 mg tablet Take 50 mg by mouth every six (6) hours as needed for Pain.   Yes Phys Other, MD   ibuprofen (ADVIL) 200 mg tablet Take 400 mg by mouth.   Yes Phys Other, MD   oxyCODONE-acetaminophen (PERCOCET) 5-325 mg per tablet Take 1-2 Tabs by mouth every six (6) hours as needed for Pain. Max Daily Amount: 8 Tabs. 08/02/15  Yes Andreas BlowerEric J Jason Hauge, MD   diazePAM (VALIUM) 5 mg tablet Take 1 Tab by mouth every eight (8) hours as needed (as needed for muscle spasm). Max Daily Amount: 15 mg. 08/02/15  Yes Andreas BlowerEric J Isair Inabinet, MD   aspirin delayed-release 81 mg tablet Take  by mouth daily.   Yes Phys Other, MD   rosuvastatin (CRESTOR) 20 mg tablet Take 20 mg by mouth nightly.   Yes Phys Other, MD   levothyroxine (SYNTHROID) 50 mcg tablet Take  by mouth Daily (before breakfast).   Yes Phys Other, MD   finasteride (PROSCAR) 5 mg tablet Take 5 mg by mouth daily.   Yes Phys Other, MD   zolpidem (AMBIEN) 10 mg tablet Take  by mouth nightly as needed for Sleep.   Yes Phys Other, MD   tamsulosin (FLOMAX) 0.4 mg capsule Take 0.4 mg by mouth daily.      Phys Other, MD         Andreas BlowerEric J Jb Dulworth, MD

## 2015-08-02 NOTE — ED Notes (Signed)
Pt disch amb with slow but steady gait  . Patient is awake, alert, and oriented, speech is clear and patient is able to ambulate (if applicable) and ready for discharge. Verbal and written discharge instructions provided and has the cognitive understanding of discharge instructions. Discharged home with family. All questions answered.

## 2015-08-02 NOTE — ED Triage Notes (Signed)
Pt to ed with c/o lower back pain starting yesterday. States worse with movement   States pain is spasm like

## 2015-08-02 NOTE — ED Notes (Signed)
Pharmacy called us to say they never received the scripts, Dr. Edward JollySilva called and to rectify this.

## 2016-11-03 ENCOUNTER — Encounter

## 2016-11-03 ENCOUNTER — Inpatient Hospital Stay: Admit: 2016-11-03 | Payer: PRIVATE HEALTH INSURANCE | Primary: Family Medicine

## 2016-11-03 ENCOUNTER — Emergency Department: Admit: 2016-11-04 | Payer: PRIVATE HEALTH INSURANCE | Primary: Family Medicine

## 2016-11-03 ENCOUNTER — Observation Stay

## 2016-11-03 DIAGNOSIS — M549 Dorsalgia, unspecified: Secondary | ICD-10-CM

## 2016-11-03 DIAGNOSIS — N132 Hydronephrosis with renal and ureteral calculous obstruction: Secondary | ICD-10-CM

## 2016-11-03 NOTE — ED Triage Notes (Signed)
Pt c/o left flank pain on and off since Sat. + N/V. No diarrhea. + hx kidney stones. Pt saw PCP, Dr. Ladona Ridgelaylor,  today and had outpt xray. Pt has Rx for ultrasound.

## 2016-11-03 NOTE — ED Provider Notes (Addendum)
Patient is a 66 y.o. male presenting with flank pain. The history is provided by the patient.   Flank Pain    This is a new problem. The current episode started yesterday. The problem has been rapidly worsening. The problem occurs constantly. Patient reports not work related injury.The pain is associated with no known injury. The pain is present in the middle back and left side. The quality of the pain is described as sharp. Radiates to: left lower abdomen. The pain is moderate. The pain is the same all the time. Associated symptoms include abdominal pain. Pertinent negatives include no chest pain, no fever, no numbness, no bowel incontinence, no perianal numbness, no bladder incontinence, no dysuria, no leg pain, no paresthesias, no paresis, no tingling and no weakness. He has tried analgesics for the symptoms. The treatment provided no relief. Risk factors: none. The patient's surgical history non-contributory        Past Medical History:   Diagnosis Date   ??? CAD (coronary artery disease)     cholesterol       Past Surgical History:   Procedure Laterality Date   ??? HX PROSTATECTOMY      enlarged   ??? HX UROLOGICAL           No family history on file.    Social History     Social History   ??? Marital status: MARRIED     Spouse name: N/A   ??? Number of children: N/A   ??? Years of education: N/A     Occupational History   ??? Not on file.     Social History Main Topics   ??? Smoking status: Former Smoker   ??? Smokeless tobacco: Former NeurosurgeonUser     Quit date: 08/02/1975   ??? Alcohol use Yes      Comment: soc   ??? Drug use: No   ??? Sexual activity: Not on file     Other Topics Concern   ??? Not on file     Social History Narrative         ALLERGIES: Review of patient's allergies indicates no known allergies.    Review of Systems   Constitutional: Positive for activity change and appetite change. Negative for chills and fever.   HENT: Negative.    Eyes: Negative.    Respiratory: Negative.     Cardiovascular: Negative.  Negative for chest pain.   Gastrointestinal: Positive for abdominal pain, constipation, nausea and vomiting. Negative for abdominal distention, blood in stool and bowel incontinence.   Genitourinary: Positive for flank pain and hematuria. Negative for bladder incontinence, decreased urine volume, dysuria, scrotal swelling and testicular pain.   Musculoskeletal: Negative for back pain and gait problem.   Skin: Negative.    Neurological: Negative.  Negative for tingling, weakness, numbness and paresthesias.   Hematological: Does not bruise/bleed easily.   All other systems reviewed and are negative.      There were no vitals filed for this visit.         Physical Exam   Constitutional: He appears well-developed and well-nourished. He is cooperative.  Non-toxic appearance. He does not have a sickly appearance. He does not appear ill. He appears distressed.   HENT:   Head: Normocephalic and atraumatic.   Mouth/Throat: Uvula is midline, oropharynx is clear and moist and mucous membranes are normal.   Eyes: Conjunctivae, EOM and lids are normal. No scleral icterus.   Cardiovascular: Normal rate, regular rhythm, S1 normal, S2 normal and intact  distal pulses.    Pulmonary/Chest: Effort normal and breath sounds normal. No accessory muscle usage. No tachypnea. No respiratory distress.   Abdominal: Soft. There is tenderness. There is guarding and CVA tenderness.   Neurological: He is alert.   Skin: Skin is warm and dry. No rash noted. He is not diaphoretic. No pallor.   Psychiatric: His mood appears anxious. His speech is rapid and/or pressured.   Vitals reviewed.       MDM  Number of Diagnoses or Management Options  Diagnosis management comments: 66 yo followed by PCP last 2 days for possible kidney stone with left flank pain.  Had outpatient plain films today which showed marked fecal retention and possible RLQ calcification possible fecolith.  Patient has noticed blood in urine and has had some  vomiting.  Last took percocet about 4 hours ago.  No difficulty voiding.  Takes flomax for BPH.        Exam:    Patient appears acute uncomfortable.  Holding left flank.  Hypersensitive to any light touch left flank.  No distention.       ED Course:    IV Dilaudid 1 mg  NS IV  CT abd pelvis without contrast exclude ureterolithiasis  CBC, CMP, UA    12:09 AM  Called tech to transport patient for CT imaging now.  Giving additional 1 mg dilaudid, 30 mg toradol and 25 phenergan IV for pain/vomiting not relieved by initial mg IV dilaudid and IV zofran.Normal WBC, BUN 29 Cr 1.49 with baseline kidney function normal.  Normal liver enzymes.  UA with small hematuria 20-50 rbcs.  Receiving liter NS IV.    1:04 AM  CT with 4 mm distal ureteral stone left and marked fecal retention.  Patient with hx of chronic constipation, likely ileus 2/2 ureterolithiasis with severe constipation also contributing to pain.  Some mild renal insufficiency as well 2/2 obstruction.  Discussed with Dr. Rubin Payor for admission for pain control, hydration and bowel cleansing.  He will see patient.      Impression:    Obstructive uropathy  Intractable pain  Severe fecal retention  Renal insufficiency 2/2 obstruction    Dispo:    As above           Amount and/or Complexity of Data Reviewed  Clinical lab tests: ordered and reviewed  Tests in the radiology section of CPT??: ordered and reviewed  Discuss the patient with other providers: yes  Independent visualization of images, tracings, or specimens: yes    Risk of Complications, Morbidity, and/or Mortality  Presenting problems: high  Diagnostic procedures: high  Management options: high  General comments: Critical Care Time:  20 minutes exclusive of procedures due to critical concerns of patient related to severe pain, neurologic, hemodynamic and/or airway, respiratory complaints which make this patient unstable or potentially unstable and required my immediate bedside  presence and actions to be taken immediately to assess, diagnose and treat this condition.     Critical Care  Total time providing critical care: < 30 minutes    Patient Progress  Patient progress: stable        ED Course       Procedures

## 2016-11-03 NOTE — ED Notes (Signed)
Pt vomiting and having dry heaves.

## 2016-11-04 ENCOUNTER — Inpatient Hospital Stay
Admit: 2016-11-04 | Discharge: 2016-11-04 | Disposition: A | Discharge status: Home / Self Care | Payer: PRIVATE HEALTH INSURANCE | Attending: Emergency Medical Services

## 2016-11-04 LAB — URINE MICROSCOPIC

## 2016-11-04 LAB — CBC WITH AUTOMATED DIFF
ABS. BASOPHILS: 0 10*3/uL (ref 0.0–0.1)
ABS. EOSINOPHILS: 0.2 10*3/uL (ref 0.0–0.2)
ABS. LYMPHOCYTES: 1.5 10*3/uL (ref 1.0–5.5)
ABS. MONOCYTES: 0.8 10*3/uL (ref 0.1–1.0)
ABS. NEUTROPHILS: 7.6 10*3/uL (ref 2.0–8.1)
BASOPHILS: 0 % (ref 0.0–1.0)
EOSINOPHILS: 2 % (ref 0.0–2.0)
HCT: 42.6 % (ref 42.0–52.0)
HGB: 14.9 g/dL (ref 14.0–18.0)
LYMPHOCYTES: 15 % — ABNORMAL LOW (ref 20.5–51.1)
MCH: 31.6 PG — ABNORMAL HIGH (ref 27.0–31.0)
MCHC: 35 g/dL (ref 30.5–36.0)
MCV: 90.3 FL (ref 80.0–96.0)
MONOCYTES: 7 % (ref 1.7–10.0)
MPV: 9.3 FL — ABNORMAL LOW (ref 10.2–12.7)
NEUTROPHILS: 76 % — ABNORMAL HIGH (ref 42.2–75.2)
PLATELET: 156 10*3/uL (ref 122–400)
RBC: 4.72 M/uL (ref 4.70–6.10)
RDW: 12.4 % (ref 11.4–14.6)
WBC: 10.1 10*3/uL (ref 4.8–10.8)

## 2016-11-04 LAB — URINALYSIS W/ RFLX MICROSCOPIC
Bilirubin: NEGATIVE
Glucose: NEGATIVE mg/dL
Ketone: NEGATIVE mg/dL
Leukocyte Esterase: NEGATIVE
Nitrites: NEGATIVE
Protein: NEGATIVE mg/dL
Specific gravity: 1.015 (ref 1.005–1.030)
Urobilinogen: 0.2 EU/dL (ref 0.1–1.0)
pH (UA): 5.5 (ref 4.5–8.0)

## 2016-11-04 LAB — METABOLIC PANEL, COMPREHENSIVE
A-G Ratio: 1.2 (ref 1.0–1.5)
ALT (SGPT): 32 U/L (ref 12–78)
AST (SGOT): 29 U/L (ref 15–37)
Albumin: 4.2 g/dL (ref 3.4–5.0)
Alk. phosphatase: 85 U/L (ref 46–116)
Anion gap: 11 mmol/L (ref 8–20)
BUN: 29 mg/dL — ABNORMAL HIGH (ref 7–18)
Bilirubin, total: 0.6 mg/dL (ref 0.2–1.0)
CO2: 25 mmol/L (ref 21–32)
Calcium: 9.4 mg/dL (ref 8.5–10.1)
Chloride: 101 mmol/L (ref 98–107)
Creatinine: 1.49 mg/dL — ABNORMAL HIGH (ref 0.70–1.30)
GFR est AA: >60 mL/min/{1.73_m2} (ref >60)
GFR est non-AA: 50 mL/min/{1.73_m2} — ABNORMAL LOW (ref >60)
Globulin: 3.6 g/dL (ref 2.5–5.0)
Glucose: 127 mg/dL — ABNORMAL HIGH (ref 74–106)
Potassium: 3.8 mmol/L (ref 3.5–5.1)
Protein, total: 7.8 g/dL (ref 6.4–8.2)
Sodium: 136 mmol/L (ref 136–145)

## 2016-11-04 MED ORDER — HYDROMORPHONE (PF) 2 MG/ML IJ SOLN
2 mg/mL | Freq: Once | INTRAMUSCULAR | Status: AC
Start: 2016-11-04 — End: 2016-11-04
  Administered 2016-11-04: 05:00:00 via INTRAVENOUS

## 2016-11-04 MED ORDER — ONDANSETRON (PF) 4 MG/2 ML INJECTION
4 mg/2 mL | INTRAMUSCULAR | Status: AC
Start: 2016-11-04 — End: 2016-11-03
  Administered 2016-11-04: 05:00:00 via INTRAVENOUS

## 2016-11-04 MED ORDER — HYDROMORPHONE (PF) 2 MG/ML IJ SOLN
2 mg/mL | INTRAMUSCULAR | Status: AC
Start: 2016-11-04 — End: 2016-11-04
  Administered 2016-11-04: 19:00:00 via INTRAVENOUS

## 2016-11-04 MED ORDER — HYDROMORPHONE (PF) 2 MG/ML IJ SOLN
2 mg/mL | INTRAMUSCULAR | Status: AC
Start: 2016-11-04 — End: 2016-11-05
  Administered 2016-11-04: 23:00:00

## 2016-11-04 MED ORDER — SODIUM CHLORIDE 0.9% BOLUS IV
0.9 % | Freq: Once | INTRAVENOUS | Status: AC
Start: 2016-11-04 — End: 2016-11-04
  Administered 2016-11-04: 06:00:00 via INTRAVENOUS

## 2016-11-04 MED ORDER — HYDROMORPHONE (PF) 1 MG/ML IJ SOLN
1 mg/mL | INTRAMUSCULAR | Status: DC | PRN
Start: 2016-11-04 — End: 2016-11-05
  Administered 2016-11-04 – 2016-11-05 (×5): via INTRAVENOUS

## 2016-11-04 MED ORDER — LACTULOSE 20 GRAM/30 ML ORAL SOLUTION
20 gram/30 mL | Freq: Two times a day (BID) | ORAL | Status: DC | PRN
Start: 2016-11-04 — End: 2016-11-05
  Administered 2016-11-04: 08:00:00 via ORAL

## 2016-11-04 MED ORDER — KETOROLAC TROMETHAMINE 30 MG/ML INJECTION
30 mg/mL (1 mL) | INTRAMUSCULAR | Status: AC
Start: 2016-11-04 — End: 2016-11-04
  Administered 2016-11-04: 05:00:00 via INTRAVENOUS

## 2016-11-04 MED ORDER — PROMETHAZINE 25 MG/ML INJECTION
25 mg/mL | Freq: Once | INTRAMUSCULAR | Status: AC
Start: 2016-11-04 — End: 2016-11-04
  Administered 2016-11-04: 05:00:00 via INTRAVENOUS

## 2016-11-04 MED ORDER — SENNOSIDES 8.6 MG TAB
8.6 mg | Freq: Every evening | ORAL | Status: DC
Start: 2016-11-04 — End: 2016-11-05
  Administered 2016-11-04 – 2016-11-05 (×2): via ORAL

## 2016-11-04 MED ORDER — SODIUM CHLORIDE 0.9 % IV
Freq: Once | INTRAVENOUS | Status: AC
Start: 2016-11-04 — End: 2016-11-04
  Administered 2016-11-04: 05:00:00 via INTRAVENOUS

## 2016-11-04 MED ORDER — SODIUM CHLORIDE 0.9 % IV
INTRAVENOUS | Status: DC
Start: 2016-11-04 — End: 2016-11-05
  Administered 2016-11-04 – 2016-11-05 (×6): via INTRAVENOUS

## 2016-11-04 MED ORDER — HYDROMORPHONE (PF) 2 MG/ML IJ SOLN
2 mg/mL | Freq: Once | INTRAMUSCULAR | Status: AC
Start: 2016-11-04 — End: 2016-11-03
  Administered 2016-11-04: 05:00:00 via INTRAVENOUS

## 2016-11-04 MED ORDER — SODIUM CHLORIDE 0.9 % IJ SYRG
Freq: Three times a day (TID) | INTRAMUSCULAR | Status: DC
Start: 2016-11-04 — End: 2016-11-05
  Administered 2016-11-04 – 2016-11-05 (×5): via INTRAVENOUS

## 2016-11-04 MED ORDER — POLYETHYLENE GLYCOL 3350 17 GRAM (100 %) ORAL POWDER PACKET
17 gram | Freq: Every day | ORAL | Status: DC
Start: 2016-11-04 — End: 2016-11-05
  Administered 2016-11-04 – 2016-11-05 (×2): via ORAL

## 2016-11-04 MED ORDER — TAMSULOSIN SR 0.4 MG 24 HR CAP
0.4 mg | Freq: Every day | ORAL | Status: DC
Start: 2016-11-04 — End: 2016-11-05
  Administered 2016-11-04 – 2016-11-05 (×2): via ORAL

## 2016-11-04 MED ORDER — SODIUM CHLORIDE 0.9 % IJ SYRG
INTRAMUSCULAR | Status: DC | PRN
Start: 2016-11-04 — End: 2016-11-05

## 2016-11-04 MED ORDER — DOCUSATE SODIUM 100 MG CAP
100 mg | Freq: Two times a day (BID) | ORAL | Status: DC
Start: 2016-11-04 — End: 2016-11-05
  Administered 2016-11-04 – 2016-11-05 (×3): via ORAL

## 2016-11-04 MED FILL — HYDROMORPHONE (PF) 2 MG/ML IJ SOLN: 2 mg/mL | INTRAMUSCULAR | Qty: 1

## 2016-11-04 MED FILL — SODIUM CHLORIDE 0.9 % IV: INTRAVENOUS | Qty: 1000

## 2016-11-04 MED FILL — HYDROMORPHONE (PF) 1 MG/ML IJ SOLN: 1 mg/mL | INTRAMUSCULAR | Qty: 1

## 2016-11-04 MED FILL — LACTULOSE 20 GRAM/30 ML ORAL SOLUTION: 20 gram/30 mL | ORAL | Qty: 30

## 2016-11-04 MED FILL — PROMETHAZINE 25 MG/ML INJECTION: 25 mg/mL | INTRAMUSCULAR | Qty: 1

## 2016-11-04 MED FILL — NORMAL SALINE FLUSH 0.9 % INJECTION SYRINGE: INTRAMUSCULAR | Qty: 10

## 2016-11-04 MED FILL — KETOROLAC TROMETHAMINE 30 MG/ML INJECTION: 30 mg/mL (1 mL) | INTRAMUSCULAR | Qty: 1

## 2016-11-04 MED FILL — ONDANSETRON (PF) 4 MG/2 ML INJECTION: 4 mg/2 mL | INTRAMUSCULAR | Qty: 2

## 2016-11-04 MED FILL — DOK 100 MG CAPSULE: 100 mg | ORAL | Qty: 1

## 2016-11-04 MED FILL — SENNA 8.6 MG TABLET: 8.6 mg | ORAL | Qty: 1

## 2016-11-04 MED FILL — POLYETHYLENE GLYCOL 3350 17 GRAM (100 %) ORAL POWDER PACKET: 17 gram | ORAL | Qty: 1

## 2016-11-04 MED FILL — TAMSULOSIN SR 0.4 MG 24 HR CAP: 0.4 mg | ORAL | Qty: 1

## 2016-11-04 NOTE — Anesthesia Pre-Procedure Evaluation (Signed)
Anesthetic History   No history of anesthetic complications            Review of Systems / Medical History  Patient summary reviewed, nursing notes reviewed and pertinent labs reviewed    Pulmonary  Within defined limits                 Neuro/Psych   Within defined limits           Cardiovascular              CAD    Exercise tolerance: >4 METS     GI/Hepatic/Renal  Within defined limits              Endo/Other  Within defined limits           Other Findings              Physical Exam    Airway  Mallampati: II  TM Distance: > 6 cm  Neck ROM: normal range of motion   Mouth opening: Normal     Cardiovascular  Regular rate and rhythm,  S1 and S2 normal,  no murmur, click, rub, or gallop  Rhythm: regular  Rate: normal         Dental  No notable dental hx       Pulmonary  Breath sounds clear to auscultation               Abdominal  Abdominal exam normal       Other Findings            Anesthetic Plan    ASA: 2, emergent  Anesthesia type: general          Induction: Intravenous  Anesthetic plan and risks discussed with: Patient

## 2016-11-04 NOTE — Progress Notes (Signed)
Pt admitted earlier today.     Per admitting note:    "Active Problems:    Nephrolithiasis (11/04/2016)  ??    Ct scan abd/ pelvis w/out contrast:      ??  "IMPRESSION  IMPRESSION:         There is moderate left hydroureteronephrosis. This is likely secondary to a   0.4 cm calcification in the distal left ureter."     Intractable pain due to nephrolithiasis with obstructive uropathy  - IVF  - pain control  - bowel regimen for chronic constipation."    Awaiting urology consult.    Continue current rx.

## 2016-11-04 NOTE — Op Note (Signed)
DATE OF OPERATION: 11/04/2016    PREOPERATIVE DIAGNOSIS: left ureteral calculus  POSTOPERATIVE DIAGNOSIS: Same.   PROCEDURE: Cystoscopy, left retrograde pyelogram, left ureteral stent placement.   SURGEON: Dr. Lawerance BachSteven Aicia Babinski   ANESTHESIA: General.   ANESTHESIOLOGIST:Manish Earnest ConroyG Patel, MD  OPERATIVE INDICATIONS: left flank pain due to left distal ureteral stone, poorly controlled.  OPERATIVE PROCEDURE: The patient was brought to the Operating Room and a general anesthetic was administered. He was then placed in the dorsal lithotomy position and prepped and draped in the usual sterile fashion. A #22 French Storz cystoscope was inserted into the bladder under direct vision. Residual urine was drained. The bladder was systematically examined using both the 30 and the 70 degrees lenses. Fluoroscopy was then performed over the left urinary tract and images were saved. A #6 [place]French open-ended catheter was inserted into the left ureteral orifice. Contrast was injected while watching under fluoroscopy and images were saved. A 0.038 inch Glidewire was then advanced under fluoroscopic guidance through the catheter, up the left ureter to the level of the level of the renal pelvis. The catheter was advanced to the renal pelvis and the wire removed. A hydronephrotic drip of clear urine was seen. Contrast was injected to visualize the level of the renal pelvis. The wire was replaced and the cathter removed. A #6 French x 26 cm stent was advanced over the wire under fluoroscopic guidance and direct vision. Once the stent was in good position, the wire was removed. A good curl was seen fluoroscopically in both the kidney and the bladder. The cystoscope was then removed and a rectal exam under anesthesia was performed. The patient then emerged from anesthesia and was transported to the Recovery Room in satisfactory condition after tolerating the procedure well.   OPERATIVE FINDINGS: The bladder revealed no tumors, stones, foreign  bodies, clots or diverticula. The trigone and ureteral orifices were normal. The prostate was normal. The urethra was normal. Rectal revealed a small, benign prostate.  Left retrograde pyelogram revealed a filling defect in the left distal ureter corresponding to the stone. There was mild left hydronephrosis.  Estimated blood loss - none.   RECOMMENDATIONS: can go home when stable. Will contact him to arrange ureteroscopic removal of the stone.     Hetty ElySteven J Lucee Brissett, MD  11/04/2016  8:43 PM

## 2016-11-04 NOTE — Progress Notes (Signed)
66 year old male placed on observation with Nephrolithiasis.  He is independent in ADL's and ambulation, lives with his wife, still drives.  I spoke with patient bedside, he tells me he is very independent, he does not anticipate any discharge needs, anticipate discharge plan will be home with out patient MD follow up.    Care Management Interventions  PCP Verified by CM: Yes (Dr. Herbie SaxonAttardo)  Mode of Transport at Discharge: Other (see comment) (Family)  Current Support Network: Lives with Spouse, Own Home  Confirm Follow Up Transport: Self  Plan discussed with Pt/Family/Caregiver: Yes  Discharge Location  Discharge Placement: Home

## 2016-11-04 NOTE — Other (Signed)
Bedside and Verbal shift change report given to Tiffany Oszmanski, RN (oncoming nurse) by Matthew L Dulgerian, RN   (offgoing nurse). Report included the following information SBAR, Kardex, Intake/Output, MAR and Recent Results.

## 2016-11-04 NOTE — Progress Notes (Signed)
Patient transported to OR. Patient did not want his wife notified of procedure. Report given to [employer] Eun.

## 2016-11-04 NOTE — ED Notes (Signed)
Pt to CT via stretcher.

## 2016-11-04 NOTE — H&P (Addendum)
NAME:  Joel Graham   DOB:   Aug 25, 1951   MRN:   [id number]     PCP:  Kirke Shaggy, MD  Date/Time:  11/04/2016 6:34 AM  Subjective:   CHIEF COMPLAINT:  Left flank pain     HISTORY OF PRESENT ILLNESS:     Joel Graham is a 66 y.o.  male with a PMHx that includes: CAD, high cholesterol, hx of chronic constipation nephrolithiasis presents with left flank pain 10/10 intermittent since Saturday. Denies fevers, chills. Evaluated in urgent care after an abdominal xray, given Rx for percocet. Associated with N/V.     Patient denies any chest pain, no chest pressure, no shortness of breath. No other complaints. Patient follows with Kirke Shaggy, MD,     Past Medical History:   Diagnosis Date   ??? CAD (coronary artery disease)     cholesterol        Past Surgical History:   Procedure Laterality Date   ??? HX PROSTATECTOMY      enlarged   ??? HX UROLOGICAL         Social History   Substance Use Topics   ??? Smoking status: Former Smoker     Quit date: 11/03/1981   ??? Smokeless tobacco: Former Systems developer     Quit date: 08/02/1975   ??? Alcohol use Yes      Comment: occ        History reviewed. No pertinent family history.     No Known Allergies     Prior to Admission Medications   Prescriptions Last Dose Informant Patient Reported? Taking?   aspirin delayed-release 81 mg tablet 11/03/2016 at Unknown time  Yes Yes   Sig: Take  by mouth daily.   ibuprofen (ADVIL) 200 mg tablet 11/02/2016 at Unknown time  Yes Yes   Sig: Take 400 mg by mouth.   levothyroxine (SYNTHROID) 50 mcg tablet 11/02/2016 at Unknown time  Yes Yes   Sig: Take  by mouth Daily (before breakfast).   oxyCODONE-acetaminophen (PERCOCET) 5-325 mg per tablet 11/03/2016 at Unknown time  No Yes   Sig: Take 1-2 Tabs by mouth every six (6) hours as needed for Pain. Max Daily Amount: 8 Tabs.   rosuvastatin (CRESTOR) 20 mg tablet 11/03/2016 at Unknown time  Yes Yes   Sig: Take 20 mg by mouth nightly.   tamsulosin (FLOMAX) 0.4 mg capsule 11/02/2016 at Unknown time  Yes Yes    Sig: Take 0.4 mg by mouth daily.     zolpidem (AMBIEN) 10 mg tablet 11/02/2016 at Unknown time  Yes Yes   Sig: Take  by mouth nightly as needed for Sleep.      Facility-Administered Medications: None       REVIEW OF SYSTEMS:      Constitutional: negative fever, negative chills, negative weight loss  Eyes:   negative visual changes  ENT:   negative sore throat, tongue or lip swelling  Respiratory:  negative cough, negative dyspnea  Cards:  negative for chest pain, palpitations, lower extremity edema  GI:   Hx of chronic constipation   Genitourinary: negative for frequency, dysuria  Integument:  negative for rash and pruritus  Hematologic:  negative for easy bruising and gum/nose bleeding  Musculoskel: negative for myalgias,  back pain and muscle weakness  Neurological:  negative for headaches, dizziness, vertigo  Behavl/Psych: negative for feelings of anxiety, depression     Pertinent Positives include :    Objective:   VITALS:    Visit Vitals   ???  BP 164/84 (BP 1 Location: Left arm, BP Patient Position: At rest)  Comment: ambulated from bathroom to bed   ??? Pulse 60   ??? Temp 98.7 ??F (37.1 ??C)   ??? Resp 20   ??? Ht '5\' 9"'  (1.753 m)   ??? Wt 64.5 kg (142 lb 4.8 oz)   ??? SpO2 95%   ??? BMI 21.01 kg/m2     Temp (24hrs), Avg:98.5 ??F (36.9 ??C), Min:97.7 ??F (36.5 ??C), Max:99 ??F (37.2 ??C)      PHYSICAL EXAM:   General appearance: alert, cooperative, no distress, appears stated age  Head: Normocephalic, without obvious abnormality, atraumatic  Neurologic: Alert and oriented X 3, non-focal  Lungs: clear to auscultation bilaterally  Chest wall: no tenderness  Heart: S1S2   Extremities:  no cyanosis or swelling   Skin: Skin color, texture, turgor normal. No rashes or lesions  Psych: Normal affect, though content wnl.         LAB DATA REVIEWED:    Recent Results (from the past 24 hour(s))   URINALYSIS W/ RFLX MICROSCOPIC    Collection Time: 11/03/16 11:15 PM   Result Value Ref Range    Color YELLOW YEL      Appearance CLEAR CLEAR       Specific gravity 1.015 1.005 - 1.030      pH (UA) 5.5 4.5 - 8.0      Protein NEGATIVE  NEG mg/dL    Glucose NEGATIVE  NEG mg/dL    Ketone NEGATIVE  NEG mg/dL    Bilirubin NEGATIVE  NEG      Blood SMALL (A) NEG      Urobilinogen 0.2 0.1 - 1.0 EU/dL    Nitrites NEGATIVE  NEG      Leukocyte Esterase NEGATIVE  NEG     URINE MICROSCOPIC    Collection Time: 11/03/16 11:15 PM   Result Value Ref Range    WBC NONE 0 - 3 /hpf    RBC 20-50 0 - 3 /hpf    Epithelial cells NONE 0 - 10 /hpf    Bacteria NONE NONE /hpf    Casts NONE NONE /lpf    Crystals, urine NONE NONE /LPF   CBC WITH AUTOMATED DIFF    Collection Time: 11/03/16 11:34 PM   Result Value Ref Range    WBC 10.1 4.8 - 10.8 K/uL    RBC 4.72 4.70 - 6.10 M/uL    HGB 14.9 14.0 - 18.0 g/dL    HCT 42.6 42.0 - 52.0 %    MCV 90.3 80.0 - 96.0 FL    MCH 31.6 (H) 27.0 - 31.0 PG    MCHC 35.0 30.5 - 36.0 g/dL    RDW 12.4 11.4 - 14.6 %    PLATELET 156 122 - 400 K/uL    MPV 9.3 (L) 10.2 - 12.7 FL    NEUTROPHILS 76 (H) 42.2 - 75.2 %    LYMPHOCYTES 15 (L) 20.5 - 51.1 %    MONOCYTES 7 1.7 - 10.0 %    EOSINOPHILS 2 0.0 - 2.0 %    BASOPHILS 0 0.0 - 1.0 %    ABS. NEUTROPHILS 7.6 2.0 - 8.1 K/UL    ABS. LYMPHOCYTES 1.5 1.0 - 5.5 K/UL    ABS. MONOCYTES 0.8 0.1 - 1.0 K/UL    ABS. EOSINOPHILS 0.2 0.0 - 0.2 K/UL    ABS. BASOPHILS 0.0 0.0 - 0.1 K/UL    DF AUTOMATED     METABOLIC PANEL, COMPREHENSIVE  Collection Time: 11/03/16 11:34 PM   Result Value Ref Range    Sodium 136 136 - 145 mmol/L    Potassium 3.8 3.5 - 5.1 mmol/L    Chloride 101 98 - 107 mmol/L    CO2 25 21 - 32 mmol/L    Anion gap 11 8 - 20 mmol/L    Glucose 127 (H) 74 - 106 mg/dL    BUN 29 (H) 7 - 18 mg/dL    Creatinine 1.49 (H) 0.70 - 1.30 mg/dL    GFR est AA >60 >60 ml/min/1.38m    GFR est non-AA 50 (L) >60 ml/min/1.753m   Calcium 9.4 8.5 - 10.1 mg/dL    Bilirubin, total 0.6 0.2 - 1.0 mg/dL    ALT (SGPT) 32 12 - 78 U/L    AST (SGOT) 29 15 - 37 U/L    Alk. phosphatase 85 46 - 116 U/L    Protein, total 7.8 6.4 - 8.2 g/dL     Albumin 4.2 3.4 - 5.0 g/dL    Globulin 3.6 2.5 - 5.0 g/dL    A-G Ratio 1.2 1.0 - 1.5         CT abdomen/pelvis:  There is moderate left hydroureteronephrosis. This is likely secondary to a   0.4 cm calcification in the distal left ureter    Assessment/Plan:      Active Problems:    Nephrolithiasis (11/04/2016)       Intractable pain due to nephrolithiasis with obstructive uropathy  - IVF  - pain control  - bowel regimen for chronic constipation               ___________________________________________________  Admitting Physician: ImRoswell NickelSiddiqui, DO

## 2016-11-04 NOTE — Progress Notes (Signed)
Patient received awake, alert and oriented, resting in bed speaking with physician. Will assess and monitor.

## 2016-11-04 NOTE — Other (Signed)
Bedside, Verbal and Written shift change report given to Rob Zawadzki (oncoming nurse) by Tiffany Oszmanski (offgoing nurse). Report included the following information SBAR, Kardex and MAR.

## 2016-11-04 NOTE — ED Notes (Signed)
/  pt continues with vomiting and dry heaves.

## 2016-11-04 NOTE — ED Notes (Signed)
Pt returns from CT. Pt appears much more comfortable. No further vomiting.

## 2016-11-04 NOTE — ED Notes (Signed)
Dr. Susy ManorHaralson at bedside. Pt worried pain will come back if he goes home. Dr. Susy ManorHaralson to SW Dr. Rubin PayorSiddiqui.

## 2016-11-04 NOTE — Other (Signed)
TRANSFER - IN REPORT:    Ver[facility] on Joel BreedWilliam Graham  being received from PACU(unit) for routine post - op      Report consisted of patient???s Situation, Background, Assessment and   Recommendations(SBAR).     Information from the following report(s) SBAR, OR Summary, MAR and Recent Results was reviewed with the receiving nurse.    Opportunity for questions and clarification was provided.      Assessment completed upon patient???s arrival to unit and care assumed.

## 2016-11-04 NOTE — Other (Signed)
TRANSFER - IN REPORT:    Verbal report received from Lum Babeebbie Freeman, RN(name) on Park BreedWilliam Plyler  being received from ER(unit) for routine progression of care      Report consisted of patient???s Situation, Background, Assessment and   Recommendations(SBAR).     Information from the following report(s) SBAR, Kardex, ED Summary, Intake/Output, MAR and Recent Results was reviewed with the receiving nurse.    Opportunity for questions and clarification was provided.      Assessment completed upon patient???s arrival to unit and care assumed.

## 2016-11-04 NOTE — Other (Signed)
TRANSFER - IN REPORT:    Verbal report received from Garald Braverobert Zawadzki RN(name) on Joel Graham  being received from room 129B(unit) for ordered procedure      Report consisted of patient???s Situation, Background, Assessment and   Recommendations(SBAR).     Information from the following report(s) SBAR, Kardex and MAR was reviewed with the receiving nurse.    Opportunity for questions and clarification was provided.      Assessment completed upon patient???s arrival to unit and care assumed.

## 2016-11-04 NOTE — Other (Signed)
TRANSFER - OUT REPORT:    Verbal report given to R Zawadzki,RN on Joel Graham  being transferred to 129 for routine post - op       Report consisted of patient???s Situation, Background, Assessment and   Recommendations(SBAR).     Information from the following report(s) OR Summary was reviewed with the receiving nurse.    Opportunity for questions and clarification was provided.      Patient transported with:   Registered Nurse

## 2016-11-04 NOTE — Other (Signed)
TRANSFER - OUT REPORT:    Verbal report given to Eun Spychalski(name) on Park BreedWilliam Aungst  being transferred to OR(unit) for ordered procedure       Report consisted of patient???s Situation, Background, Assessment and   Recommendations(SBAR).     Information from the following report(s) SBAR and Recent Results was reviewed with the receiving nurse.    Lines:   Peripheral IV 11/03/16 Left Forearm (Active)   Site Assessment Clean, dry, & intact 11/04/2016  8:30 AM   Phlebitis Assessment 0 11/04/2016  8:30 AM   Infiltration Assessment 0 11/04/2016  8:30 AM   Dressing Status Clean, dry, & intact 11/04/2016  8:30 AM   Dressing Type Transparent 11/04/2016  8:30 AM   Hub Color/Line Status Pink;Infusing 11/04/2016  2:52 AM        Opportunity for questions and clarification was provided.      Patient transported with:   Registered Nurse

## 2016-11-04 NOTE — Consults (Signed)
Urology Consultation      Requesting Physician: Rubin Payor    Chief Complaint: kidney stone    History of Present Illness: h/o stones in past. 3 day h/o severe constant pain left flank radiating to left mid abdomen. Associated nausea and vomiting. No fever, chills or hematuria. Gets relief with IV dilaudid but not with any oral medications.     Past Medical History:   Past Medical History:   Diagnosis Date   ??? CAD (coronary artery disease)     cholesterol     Past Surgical History:     Past Surgical History:   Procedure Laterality Date   ??? HX PROSTATECTOMY      enlarged   ??? HX UROLOGICAL          Medications:   Current Facility-Administered Medications   Medication Dose Route Frequency Provider Last Rate Last Dose   ??? tamsulosin (FLOMAX) capsule 0.4 mg  0.4 mg Oral DAILY Imtiyaz A. Siddiqui, DO   0.4 mg at 11/04/16 0232   ??? 0.9% sodium chloride infusion  200 mL/hr IntraVENous CONTINUOUS Imtiyaz A. Siddiqui, DO 200 mL/hr at 11/04/16 1559 200 mL/hr at 11/04/16 1559   ??? HYDROmorphone (PF) (DILAUDID) injection 1 mg  1 mg IntraVENous Q2H PRN Imtiyaz A. Siddiqui, DO   1 mg at 11/04/16 1724   ??? polyethylene glycol (MIRALAX) packet 17 g  17 g Oral DAILY Imtiyaz A. Siddiqui, DO   17 g at 11/04/16 1914   ??? lactulose (CHRONULAC) solution 10 g  10 g Oral BID PRN Imtiyaz A. Siddiqui, DO   10 g at 11/04/16 0232   ??? docusate sodium (COLACE) capsule 100 mg  100 mg Oral BID Imtiyaz A. Siddiqui, DO   100 mg at 11/04/16 7829   ??? senna (SENOKOT) tablet 8.6 mg  1 Tab Oral QHS Imtiyaz A. Siddiqui, DO   8.6 mg at 11/04/16 0232   ??? HYDROmorphone (PF) (DILAUDID) 2 mg/mL injection            ??? levoFLOXacin (LEVAQUIN) 500 mg in D5W IVPB  500 mg IntraVENous ONCE Hetty Ely, MD       ??? sodium chloride (NS) flush 5-10 mL  5-10 mL IntraVENous Q8H Nile Riggs, MD       ??? sodium chloride (NS) flush 5-10 mL  5-10 mL IntraVENous PRN Nile Riggs, MD            Allergies: No Known Allergies     Family History:    History reviewed. No pertinent family history.   No h/o stones    Social History:   Social History     Social History   ??? Marital status: MARRIED     Spouse name: N/A   ??? Number of children: N/A   ??? Years of education: N/A     Occupational History   ??? Not on file.     Social History Main Topics   ??? Smoking status: Former Smoker     Quit date: 11/03/1981   ??? Smokeless tobacco: Former Neurosurgeon     Quit date: 08/02/1975   ??? Alcohol use Yes      Comment: occ   ??? Drug use: No   ??? Sexual activity: Not on file     Other Topics Concern   ??? Not on file     Social History Narrative        Review of Systems - History obtained from the patient  General ROS: negative for -  chills or fever  Psychological ROS: negative for - depression  ENT ROS: negative for - nasal congestion or nasal discharge  Hematological and Lymphatic ROS: negative for - bleeding problems  Endocrine ROS: negative for - polydipsia/polyuria or temperature intolerance  Respiratory ROS: no cough, shortness of breath, or wheezing  Cardiovascular ROS: no chest pain or dyspnea on exertion  Gastrointestinal ROS: positive for - nausea/vomiting  negative for - constipation or diarrhea  Genito-Urinary ROS: no dysuria, trouble voiding, or hematuria  Musculoskeletal ROS: negative  negative for - low back pain  Neurological ROS: no TIA or stroke symptoms  Dermatological ROS: negative for - pruritus or rash     Physical Exam:   Constitutional:  Well developed, well nourished. In no acute distress.  Visit Vitals   ??? BP 135/71 (BP 1 Location: Left arm, BP Patient Position: At rest)   ??? Pulse (!) 51   ??? Temp 98.6 ??F (37 ??C)   ??? Resp 18   ??? Ht 5\' 9"  (1.753 m)   ??? Wt 64.5 kg (142 lb 4.8 oz)   ??? SpO2 97%   ??? BMI 21.01 kg/m2      Neurological/Psychiatric: oriented to person, place and time  Skin: inspection normal  Neck: supple. Nontender. No masses   Respiratory: normal respiratory effort  Cardiovascular: regular rate and rhythm. No leg edema   Lymphatic: no cervical or inguinal adenopathy  Gastrointestinal: abdomen soft, nontender. No masses or organomegaly  Genitourinary: anus and perineum normal. Urethral meatus, phallus, scrotum, testes, epididymides and spermatic cords normal.          Laboratory Tests:  CBC:   Lab Results   Component Value Date/Time    WBC 10.1 11/03/2016 11:34 PM    RBC 4.72 11/03/2016 11:34 PM    HGB 14.9 11/03/2016 11:34 PM    HCT 42.6 11/03/2016 11:34 PM    PLATELET 156 11/03/2016 11:34 PM     BMP:   Lab Results   Component Value Date/Time    Glucose 127 (H) 11/03/2016 11:34 PM    Sodium 136 11/03/2016 11:34 PM    Potassium 3.8 11/03/2016 11:34 PM    Chloride 101 11/03/2016 11:34 PM    CO2 25 11/03/2016 11:34 PM    BUN 29 (H) 11/03/2016 11:34 PM    Creatinine 1.49 (H) 11/03/2016 11:34 PM    Calcium 9.4 11/03/2016 11:34 PM     Lab Results   Component Value Date/Time    Color YELLOW 11/03/2016 11:15 PM    Appearance CLEAR 11/03/2016 11:15 PM    Specific gravity 1.015 11/03/2016 11:15 PM    pH (UA) 5.5 11/03/2016 11:15 PM    Protein NEGATIVE  11/03/2016 11:15 PM    Glucose NEGATIVE  11/03/2016 11:15 PM    Ketone NEGATIVE  11/03/2016 11:15 PM    Bilirubin NEGATIVE  11/03/2016 11:15 PM    Urobilinogen 0.2 11/03/2016 11:15 PM    Nitrites NEGATIVE  11/03/2016 11:15 PM    Leukocyte Esterase NEGATIVE  11/03/2016 11:15 PM    Bacteria NONE 11/03/2016 11:15 PM    WBC NONE 11/03/2016 11:15 PM    RBC 20-50 11/03/2016 11:15 PM    Casts NONE 11/03/2016 11:15 PM    Crystals, urine NONE 11/03/2016 11:15 PM       Radiology Tests: Xr Abd (kub)    Addendum Date: 11/03/2016    Addendum created by Wynonia Lawman MD on 11/03/2016 1:20:02 PM EST No radiographic evidence of renal, ureteral or bladder calculus.  If  clinically indicated, a followup renal ultrasound and/or noncontrast CT examination with stone protocol may be obtained obtained for further characterization. Initial report created on 11/03/2016 12:37:08 PM EST  Abdominal radiograph CLINICAL INFORMATION:   Abdominal pain.  Left-sided back pain. TECHNIQUE: Single supine view of the abdomen was obtained. FINDINGS:  No prior studies were submitted for direct comparison. There is a nonspecific bowel gas pattern seen.  There is no evidence of bowel obstruction seen.  There is a moderate to large amount of stool seen involving the cecum, ascending, transverse, descending and sigmoid colon.  There is a nonspecific 2.4 cm calcification seen in the right lower quadrant abdomen.  A calcified appendicolith cannot be excluded.  The visualized osseous structures appear intact.  There are degenerative changes noted throughout the visualized thoracolumbar spine.  There mild diffuse osteopenic changes noted. IMPRESSION:   No evidence of bowel obstruction. Moderate to large amount of stool involving the colon as described above.  Recommend correlation for constipation. Nonspecific calcification involving the right lower quadrant abdomen.  A calcified appendicolith cannot be excluded. THIS DOCUMENT HAS BEEN ELECTRONICALLY SIGNED BY Wynonia Lawman MD    Result Date: 11/03/2016  Abdominal radiograph CLINICAL INFORMATION:   Abdominal pain.  Left-sided back pain. TECHNIQUE: Single supine view of the abdomen was obtained. FINDINGS:  No prior studies were submitted for direct comparison. There is a nonspecific bowel gas pattern seen.  There is no evidence of bowel obstruction seen.  There is a moderate to large amount of stool seen involving the cecum, ascending, transverse, descending and sigmoid colon.  There is a nonspecific 2.4 cm calcification seen in the right lower quadrant abdomen.  A calcified appendicolith cannot be excluded.  The visualized osseous structures appear intact.  There are degenerative changes noted throughout the visualized thoracolumbar spine.  There mild diffuse osteopenic changes noted.     IMPRESSION:   No evidence of bowel obstruction. Moderate to large amount  of stool involving the colon as described above.  Recommend correlation for constipation. Nonspecific calcification involving the right lower quadrant abdomen.  A calcified appendicolith cannot be excluded. THIS DOCUMENT HAS BEEN ELECTRONICALLY SIGNED BY Wynonia Lawman MD    Ct Abd Pelv Wo Cont    Result Date: 11/04/2016  EXAM:   CT Abdomen and Pelvis Without Intravenous Contrast EXAM DATE/TIME:   11/03/2016 11:22 PM CLINICAL HISTORY:   66 years old, male; Exclude ureterolithiasis TECHNIQUE:   Axial computed tomography images of the abdomen and pelvis without intravenous  contrast.  All CT scans at this facility use one or more dose reduction techniques, viz.: automated exposure control; ma/kV adjustment per patient size (including targeted exams where dose is matched to indication; i.e. head); or iterative reconstruction technique.   Coronal and sagittal reformatted images were created and reviewed. COMPARISON:   CT ABD PELVIS W 2011-05-03 22:32 FINDINGS:   Lower thorax: No acute findings.  ABDOMEN:   Liver:  Unremarkable.   Gallbladder and bile ducts:  Unremarkable.  No calcified stones.  No ductal dilation.   Pancreas:  Unremarkable.  No ductal dilation.   Spleen:  Unremarkable.  No splenomegaly.   Adrenals:  Unremarkable.  No mass.   Kidneys and ureters:  There is moderate left hydroureteronephrosis. This is likely secondary to a 0.4 cm calcification in the distal left ureter as seen on series 3 image 67. The ureters are difficult to follow along their entire course. There are no renal calcifications. There is no hydronephrosis on the right.   Stomach  and bowel:  There is a large amount of stool throughout the colon.  No  mucosal thickening.   Appendix:  No findings to suggest acute appendicitis.  PELVIS:   Bladder:  Unremarkable.  No stones.   Reproductive:  Unremarkable as visualized.  ABDOMEN and PELVIS:   Intraperitoneal space:  Unremarkable.  No free air.  No significant fluid  collection.   Bones/joints:  Degenerative change is identified in the spine.  No acute fracture.  No dislocation.   Soft tissues:  Unremarkable.   Vasculature:  Unremarkable.  No abdominal aortic aneurysm.   Lymph nodes:  Unremarkable.  No enlarged lymph nodes.     IMPRESSION:       There is moderate left hydroureteronephrosis. This is likely secondary to a 0.4 cm calcification in the distal left ureter THIS DOCUMENT HAS BEEN ELECTRONICALLY SIGNED BY Tamsen RoersARO AIKAWA MD    CT films were reviewed    Assessment: 4mm left distal ureteral stone. Pain only controlled with parenteral meds.     Plan: Discussed options (continued medical expulsive therapy vs. Surgical treatment). Since the appropriate equipment isn't available at this hospital, If surgery elected, would need to place stent and then do ureteroscopy at a later date at a hospital with the equipment to access and break the stone. Discussed benefits/risks of each approach. He desires to have stent placed and then have a ureteroscopy at a later date. Plan cystoscopy, left retrograde pyelogram, left ureteral stent placement. Procedure, options, indications and risks discussed including but not limited to bleeding, infection, stent dysuria, need for future procedure to remove stone. Questions answered. He understands and desires to proceed.      Hetty ElySteven J Roselie Cirigliano, MD  11/04/2016  7:29 PM

## 2016-11-04 NOTE — Progress Notes (Signed)
Pt received from previous shift alert and oriented x 4. IVFs infusing. Pt voiding without difficulties. No c/o nausea. L flank controlled well with ordered medications. Will continue to monitor.

## 2016-11-04 NOTE — Progress Notes (Signed)
Pt voiding with no pain. Will continue to monitor.

## 2016-11-04 NOTE — Progress Notes (Signed)
Patient received awake, alert and oriented from PACU. Will assess and monitor.

## 2016-11-04 NOTE — ED Notes (Signed)
Dr. Siddiqui at bedside.

## 2016-11-04 NOTE — ED Notes (Signed)
TRANSFER - OUT REPORT:    Verbal report given to Rod Dulgerian, RN (name) on Joel Graham  being transferred to 1st floor (unit) for routine progression of care       Report consisted of patient???s Situation, Background, Assessment and   Recommendations(SBAR).     Information from the following report(s) ED Summary, Intake/Output, MAR and Recent Results was reviewed with the receiving nurse.    Lines:   Peripheral IV 11/03/16 Left Forearm (Active)   Site Assessment Clean, dry, & intact 11/03/2016 11:36 PM   Phlebitis Assessment 0 11/03/2016 11:36 PM   Infiltration Assessment 0 11/03/2016 11:36 PM   Dressing Status Clean, dry, & intact 11/03/2016 11:36 PM        Opportunity for questions and clarification was provided.      Patient transported with:   [employer]

## 2016-11-04 NOTE — Consults (Signed)
Urology Consultation      Requesting Physician: Rubin Payor    Chief Complaint: kidney stone    History of Present Illness: h/o stones in past. 3 day h/o severe constant pain left flank radiating to left mid abdomen. Associated nausea and vomiting. No fever, chills or hematuria. Gets relief with IV dilaudid but not with any oral medications.     Past Medical History:   Past Medical History:   Diagnosis Date   ??? CAD (coronary artery disease)     cholesterol     Past Surgical History:     Past Surgical History:   Procedure Laterality Date   ??? HX PROSTATECTOMY      enlarged   ??? HX UROLOGICAL          Medications:   Current Facility-Administered Medications   Medication Dose Route Frequency Provider Last Rate Last Dose   ??? tamsulosin (FLOMAX) capsule 0.4 mg  0.4 mg Oral DAILY Imtiyaz A. Siddiqui, DO   0.4 mg at 11/04/16 0232   ??? 0.9% sodium chloride infusion  200 mL/hr IntraVENous CONTINUOUS Imtiyaz A. Siddiqui, DO 200 mL/hr at 11/04/16 1559 200 mL/hr at 11/04/16 1559   ??? HYDROmorphone (PF) (DILAUDID) injection 1 mg  1 mg IntraVENous Q2H PRN Imtiyaz A. Siddiqui, DO   1 mg at 11/04/16 1724   ??? polyethylene glycol (MIRALAX) packet 17 g  17 g Oral DAILY Imtiyaz A. Siddiqui, DO   17 g at 11/04/16 1610   ??? lactulose (CHRONULAC) solution 10 g  10 g Oral BID PRN Imtiyaz A. Siddiqui, DO   10 g at 11/04/16 0232   ??? docusate sodium (COLACE) capsule 100 mg  100 mg Oral BID Imtiyaz A. Siddiqui, DO   100 mg at 11/04/16 9604   ??? senna (SENOKOT) tablet 8.6 mg  1 Tab Oral QHS Imtiyaz A. Siddiqui, DO   8.6 mg at 11/04/16 0232   ??? HYDROmorphone (PF) (DILAUDID) 2 mg/mL injection            ??? levoFLOXacin (LEVAQUIN) 500 mg in D5W IVPB  500 mg IntraVENous ONCE Hetty Ely, MD       ??? sodium chloride (NS) flush 5-10 mL  5-10 mL IntraVENous Q8H Nile Riggs, MD       ??? sodium chloride (NS) flush 5-10 mL  5-10 mL IntraVENous PRN Nile Riggs, MD            Allergies: No Known Allergies     Family History:   History reviewed.  No pertinent family history.   No h/o stones    Social History:   Social History     Social History   ??? Marital status: MARRIED     Spouse name: N/A   ??? Number of children: N/A   ??? Years of education: N/A     Occupational History   ??? Not on file.     Social History Main Topics   ??? Smoking status: Former Smoker     Quit date: 11/03/1981   ??? Smokeless tobacco: Former Neurosurgeon     Quit date: 08/02/1975   ??? Alcohol use Yes      Comment: occ   ??? Drug use: No   ??? Sexual activity: Not on file     Other Topics Concern   ??? Not on file     Social History Narrative        Review of Systems - History obtained from the patient  General ROS: negative for -  chills or fever  Psychological ROS: negative for - depression  ENT ROS: negative for - nasal congestion or nasal discharge  Hematological and Lymphatic ROS: negative for - bleeding problems  Endocrine ROS: negative for - polydipsia/polyuria or temperature intolerance  Respiratory ROS: no cough, shortness of breath, or wheezing  Cardiovascular ROS: no chest pain or dyspnea on exertion  Gastrointestinal ROS: positive for - nausea/vomiting  negative for - constipation or diarrhea  Genito-Urinary ROS: no dysuria, trouble voiding, or hematuria  Musculoskeletal ROS: negative  negative for - low back pain  Neurological ROS: no TIA or stroke symptoms  Dermatological ROS: negative for - pruritus or rash     Physical Exam:   Constitutional:  Well developed, well nourished. In no acute distress.  Visit Vitals   ??? BP 135/71 (BP 1 Location: Left arm, BP Patient Position: At rest)   ??? Pulse (!) 51   ??? Temp 98.6 ??F (37 ??C)   ??? Resp 18   ??? Ht 5\' 9"  (1.753 m)   ??? Wt 64.5 kg (142 lb 4.8 oz)   ??? SpO2 97%   ??? BMI 21.01 kg/m2      Neurological/Psychiatric: oriented to person, place and time  Skin: inspection normal  Neck: supple. Nontender. No masses   Respiratory: normal respiratory effort  Cardiovascular: regular rate and rhythm. No leg edema  Lymphatic: no cervical or inguinal  adenopathy  Gastrointestinal: abdomen soft, nontender. No masses or organomegaly  Genitourinary: anus and perineum normal. Urethral meatus, phallus, scrotum, testes, epididymides and spermatic cords normal.          Laboratory Tests:  CBC:   Lab Results   Component Value Date/Time    WBC 10.1 11/03/2016 11:34 PM    RBC 4.72 11/03/2016 11:34 PM    HGB 14.9 11/03/2016 11:34 PM    HCT 42.6 11/03/2016 11:34 PM    PLATELET 156 11/03/2016 11:34 PM     BMP:   Lab Results   Component Value Date/Time    Glucose 127 (H) 11/03/2016 11:34 PM    Sodium 136 11/03/2016 11:34 PM    Potassium 3.8 11/03/2016 11:34 PM    Chloride 101 11/03/2016 11:34 PM    CO2 25 11/03/2016 11:34 PM    BUN 29 (H) 11/03/2016 11:34 PM    Creatinine 1.49 (H) 11/03/2016 11:34 PM    Calcium 9.4 11/03/2016 11:34 PM     Lab Results   Component Value Date/Time    Color YELLOW 11/03/2016 11:15 PM    Appearance CLEAR 11/03/2016 11:15 PM    Specific gravity 1.015 11/03/2016 11:15 PM    pH (UA) 5.5 11/03/2016 11:15 PM    Protein NEGATIVE  11/03/2016 11:15 PM    Glucose NEGATIVE  11/03/2016 11:15 PM    Ketone NEGATIVE  11/03/2016 11:15 PM    Bilirubin NEGATIVE  11/03/2016 11:15 PM    Urobilinogen 0.2 11/03/2016 11:15 PM    Nitrites NEGATIVE  11/03/2016 11:15 PM    Leukocyte Esterase NEGATIVE  11/03/2016 11:15 PM    Bacteria NONE 11/03/2016 11:15 PM    WBC NONE 11/03/2016 11:15 PM    RBC 20-50 11/03/2016 11:15 PM    Casts NONE 11/03/2016 11:15 PM    Crystals, urine NONE 11/03/2016 11:15 PM       Radiology Tests: Xr Abd (kub)    Addendum Date: 11/03/2016    Addendum created by Wynonia Lawman MD on 11/03/2016 1:20:02 PM EST No radiographic evidence of renal, ureteral or bladder calculus.  If  clinically indicated, a followup renal ultrasound and/or noncontrast CT examination with stone protocol may be obtained obtained for further characterization. Initial report created on 11/03/2016 12:37:08 PM EST Abdominal radiograph CLINICAL INFORMATION:   Abdominal pain.  Left-sided  back pain. TECHNIQUE: Single supine view of the abdomen was obtained. FINDINGS:  No prior studies were submitted for direct comparison. There is a nonspecific bowel gas pattern seen.  There is no evidence of bowel obstruction seen.  There is a moderate to large amount of stool seen involving the cecum, ascending, transverse, descending and sigmoid colon.  There is a nonspecific 2.4 cm calcification seen in the right lower quadrant abdomen.  A calcified appendicolith cannot be excluded.  The visualized osseous structures appear intact.  There are degenerative changes noted throughout the visualized thoracolumbar spine.  There mild diffuse osteopenic changes noted. IMPRESSION:   No evidence of bowel obstruction. Moderate to large amount of stool involving the colon as described above.  Recommend correlation for constipation. Nonspecific calcification involving the right lower quadrant abdomen.  A calcified appendicolith cannot be excluded. THIS DOCUMENT HAS BEEN ELECTRONICALLY SIGNED BY Wynonia LawmanALBERT CHANG MD    Result Date: 11/03/2016  Abdominal radiograph CLINICAL INFORMATION:   Abdominal pain.  Left-sided back pain. TECHNIQUE: Single supine view of the abdomen was obtained. FINDINGS:  No prior studies were submitted for direct comparison. There is a nonspecific bowel gas pattern seen.  There is no evidence of bowel obstruction seen.  There is a moderate to large amount of stool seen involving the cecum, ascending, transverse, descending and sigmoid colon.  There is a nonspecific 2.4 cm calcification seen in the right lower quadrant abdomen.  A calcified appendicolith cannot be excluded.  The visualized osseous structures appear intact.  There are degenerative changes noted throughout the visualized thoracolumbar spine.  There mild diffuse osteopenic changes noted.     IMPRESSION:   No evidence of bowel obstruction. Moderate to large amount of stool involving the colon as described above.  Recommend correlation for  constipation. Nonspecific calcification involving the right lower quadrant abdomen.  A calcified appendicolith cannot be excluded. THIS DOCUMENT HAS BEEN ELECTRONICALLY SIGNED BY Wynonia LawmanALBERT CHANG MD    Ct Abd Pelv Wo Cont    Result Date: 11/04/2016  EXAM:   CT Abdomen and Pelvis Without Intravenous Contrast EXAM DATE/TIME:   11/03/2016 11:22 PM CLINICAL HISTORY:   66 years old, male; Exclude ureterolithiasis TECHNIQUE:   Axial computed tomography images of the abdomen and pelvis without intravenous  contrast.  All CT scans at this facility use one or more dose reduction techniques, viz.: automated exposure control; ma/kV adjustment per patient size (including targeted exams where dose is matched to indication; i.e. head); or iterative reconstruction technique.   Coronal and sagittal reformatted images were created and reviewed. COMPARISON:   CT ABD PELVIS W 2011-05-03 22:32 FINDINGS:   Lower thorax: No acute findings.  ABDOMEN:   Liver:  Unremarkable.   Gallbladder and bile ducts:  Unremarkable.  No calcified stones.  No ductal dilation.   Pancreas:  Unremarkable.  No ductal dilation.   Spleen:  Unremarkable.  No splenomegaly.   Adrenals:  Unremarkable.  No mass.   Kidneys and ureters:  There is moderate left hydroureteronephrosis. This is likely secondary to a 0.4 cm calcification in the distal left ureter as seen on series 3 image 67. The ureters are difficult to follow along their entire course. There are no renal calcifications. There is no hydronephrosis on the right.   Stomach  and bowel:  There is a large amount of stool throughout the colon.  No  mucosal thickening.   Appendix:  No findings to suggest acute appendicitis.  PELVIS:   Bladder:  Unremarkable.  No stones.   Reproductive:  Unremarkable as visualized.  ABDOMEN and PELVIS:   Intraperitoneal space:  Unremarkable.  No free air.  No significant fluid collection.   Bones/joints:  Degenerative change is identified in the spine.  No acute fracture.  No  dislocation.   Soft tissues:  Unremarkable.   Vasculature:  Unremarkable.  No abdominal aortic aneurysm.   Lymph nodes:  Unremarkable.  No enlarged lymph nodes.     IMPRESSION:       There is moderate left hydroureteronephrosis. This is likely secondary to a 0.4 cm calcification in the distal left ureter THIS DOCUMENT HAS BEEN ELECTRONICALLY SIGNED BY Tamsen Roers MD    CT films were reviewed    Assessment: 4mm left distal ureteral stone. Pain only controlled with parenteral meds.     Plan: Discussed options (continued medical expulsive therapy vs. Surgical treatment). Since the appropriate equipment isn't available at this hospital, If surgery elected, would need to place stent and then do ureteroscopy at a later date at a hospital with the equipment to access and break the stone. Discussed benefits/risks of each approach. He desires to have stent placed and then have a ureteroscopy at a later date. Plan cystoscopy, left retrograde pyelogram, left ureteral stent placement. Procedure, options, indications and risks discussed including but not limited to bleeding, infection, stent dysuria, need for future procedure to remove stone. Questions answered. He understands and desires to proceed.      Hetty Ely, MD  11/04/2016  7:29 PM

## 2016-11-04 NOTE — Op Note (Signed)
DATE OF OPERATION: 11/04/2016    PREOPERATIVE DIAGNOSIS: left ureteral calculus  POSTOPERATIVE DIAGNOSIS: Same.   PROCEDURE: Cystoscopy, left retrograde pyelogram, left ureteral stent placement.   SURGEON: Dr. Lawerance BachSteven Klaryssa Fauth   ANESTHESIA: General.   ANESTHESIOLOGIST:Manish Earnest ConroyG Patel, MD  OPERATIVE INDICATIONS: left flank pain due to left distal ureteral stone, poorly controlled.  OPERATIVE PROCEDURE: The patient was brought to the Operating Room and a general anesthetic was administered. He was then placed in the dorsal lithotomy position and prepped and draped in the usual sterile fashion. A #22 French Storz cystoscope was inserted into the bladder under direct vision. Residual urine was drained. The bladder was systematically examined using both the 30 and the 70 degrees lenses. Fluoroscopy was then performed over the left urinary tract and images were saved. A #6 [place]French open-ended catheter was inserted into the left ureteral orifice. Contrast was injected while watching under fluoroscopy and images were saved. A 0.038 inch Glidewire was then advanced under fluoroscopic guidance through the catheter, up the left ureter to the level of the level of the renal pelvis. The catheter was advanced to the renal pelvis and the wire removed. A hydronephrotic drip of clear urine was seen. Contrast was injected to visualize the level of the renal pelvis. The wire was replaced and the cathter removed. A #6 French x 26 cm stent was advanced over the wire under fluoroscopic guidance and direct vision. Once the stent was in good position, the wire was removed. A good curl was seen fluoroscopically in both the kidney and the bladder. The cystoscope was then removed and a rectal exam under anesthesia was performed. The patient then emerged from anesthesia and was transported to the Recovery Room in satisfactory condition after tolerating the procedure well.   OPERATIVE FINDINGS: The bladder revealed no tumors, stones, foreign bodies,  clots or diverticula. The trigone and ureteral orifices were normal. The prostate was normal. The urethra was normal. Rectal revealed a small, benign prostate.  Left retrograde pyelogram revealed a filling defect in the left distal ureter corresponding to the stone. There was mild left hydronephrosis.  Estimated blood loss - none.   RECOMMENDATIONS: can go home when stable. Will contact him to arrange ureteroscopic removal of the stone.     Hetty ElySteven J Justyce Baby, MD  11/04/2016  8:43 PM

## 2016-11-05 ENCOUNTER — Observation Stay: Admit: 2016-11-05 | Payer: PRIVATE HEALTH INSURANCE | Primary: Family Medicine

## 2016-11-05 MED ORDER — ONDANSETRON (PF) 4 MG/2 ML INJECTION
4 mg/2 mL | INTRAMUSCULAR | Status: DC | PRN
Start: 2016-11-05 — End: 2016-11-04

## 2016-11-05 MED ORDER — IOVERSOL 320 MG/ML IV SOLN
320 mg iodine/mL | INTRAVENOUS | Status: DC | PRN
Start: 2016-11-05 — End: 2016-11-04
  Administered 2016-11-05: 01:00:00

## 2016-11-05 MED ORDER — IOPAMIDOL 41 % INTRATHECAL
200 mg iodine /mL (41 %) | INTRATHECAL | Status: AC
Start: 2016-11-05 — End: ?

## 2016-11-05 MED ORDER — LEVOFLOXACIN IN D5W 500 MG/100 ML IV PIGGY BACK
500 mg/100 mL | Freq: Once | INTRAVENOUS | Status: AC
Start: 2016-11-05 — End: 2016-11-04
  Administered 2016-11-05: 01:00:00 via INTRAVENOUS

## 2016-11-05 MED ORDER — IODIXANOL 320 MG/ML IV SOLN
320 mg iodine/mL | INTRAVENOUS | Status: AC
Start: 2016-11-05 — End: ?

## 2016-11-05 MED ORDER — MIDAZOLAM 1 MG/ML IJ SOLN
1 mg/mL | INTRAMUSCULAR | Status: DC | PRN
Start: 2016-11-05 — End: 2016-11-04
  Administered 2016-11-05: 01:00:00 via INTRAVENOUS

## 2016-11-05 MED ORDER — MEPERIDINE (PF) 25 MG/ML INJ SOLUTION
25 mg/ml | INTRAMUSCULAR | Status: DC | PRN
Start: 2016-11-05 — End: 2016-11-04

## 2016-11-05 MED ORDER — FENTANYL CITRATE (PF) 50 MCG/ML IJ SOLN
50 mcg/mL | INTRAMUSCULAR | Status: DC | PRN
Start: 2016-11-05 — End: 2016-11-04

## 2016-11-05 MED ORDER — LIDOCAINE (PF) 20 MG/ML (2 %) IJ SOLN
20 mg/mL (2 %) | INTRAMUSCULAR | Status: DC | PRN
Start: 2016-11-05 — End: 2016-11-04
  Administered 2016-11-05: 01:00:00 via INTRAVENOUS

## 2016-11-05 MED ORDER — MIDAZOLAM 1 MG/ML IJ SOLN
1 mg/mL | INTRAMUSCULAR | Status: AC
Start: 2016-11-05 — End: ?

## 2016-11-05 MED ORDER — PROPOFOL 10 MG/ML IV EMUL
10 mg/mL | INTRAVENOUS | Status: DC | PRN
Start: 2016-11-05 — End: 2016-11-04
  Administered 2016-11-05: 01:00:00 via INTRAVENOUS

## 2016-11-05 MED ORDER — KETOROLAC TROMETHAMINE 30 MG/ML INJECTION
30 mg/mL (1 mL) | INTRAMUSCULAR | Status: DC | PRN
Start: 2016-11-05 — End: 2016-11-04
  Administered 2016-11-05: 02:00:00 via INTRAVENOUS

## 2016-11-05 MED ORDER — HYDROMORPHONE (PF) 2 MG/ML IJ SOLN
2 mg/mL | Freq: Once | INTRAMUSCULAR | Status: DC | PRN
Start: 2016-11-05 — End: 2016-11-04

## 2016-11-05 MED ORDER — ALBUTEROL SULFATE 0.083 % (0.83 MG/ML) SOLN FOR INHALATION
2.5 mg /3 mL (0.083 %) | RESPIRATORY_TRACT | Status: DC | PRN
Start: 2016-11-05 — End: 2016-11-04

## 2016-11-05 MED ORDER — DOCUSATE SODIUM 100 MG CAP
100 mg | ORAL_CAPSULE | Freq: Two times a day (BID) | ORAL | 2 refills | Status: AC
Start: 2016-11-05 — End: 2017-02-03

## 2016-11-05 MED ORDER — SODIUM CHLORIDE 0.9 % IJ SYRG
INTRAMUSCULAR | Status: DC | PRN
Start: 2016-11-05 — End: 2016-11-04

## 2016-11-05 MED ORDER — LACTATED RINGERS BOLUS IV
Freq: Once | INTRAVENOUS | Status: DC
Start: 2016-11-05 — End: 2016-11-04

## 2016-11-05 MED ORDER — LEVOFLOXACIN IN D5W 500 MG/100 ML IV PIGGY BACK
500 mg/100 mL | INTRAVENOUS | Status: AC
Start: 2016-11-05 — End: ?

## 2016-11-05 MED ORDER — DIPHENHYDRAMINE HCL 50 MG/ML IJ SOLN
50 mg/mL | Freq: Once | INTRAMUSCULAR | Status: DC
Start: 2016-11-05 — End: 2016-11-04

## 2016-11-05 MED ORDER — TRAMADOL 50 MG TAB
50 mg | Freq: Four times a day (QID) | ORAL | Status: DC | PRN
Start: 2016-11-05 — End: 2016-11-05

## 2016-11-05 MED ORDER — POLYETHYLENE GLYCOL 3350 17 GRAM (100 %) ORAL POWDER PACKET
17 gram | PACK | Freq: Every day | ORAL | 0 refills | Status: DC
Start: 2016-11-05 — End: 2018-08-27

## 2016-11-05 MED ORDER — IOVERSOL 320 MG/ML IV SOLN
320 mg iodine/mL | INTRAVENOUS | Status: AC
Start: 2016-11-05 — End: ?

## 2016-11-05 MED ORDER — HYDROMORPHONE (PF) 2 MG/ML IJ SOLN
2 mg/mL | INTRAMUSCULAR | Status: AC
Start: 2016-11-05 — End: 2016-11-04
  Administered 2016-11-05: 01:00:00

## 2016-11-05 MED ORDER — ONDANSETRON (PF) 4 MG/2 ML INJECTION
4 mg/2 mL | INTRAMUSCULAR | Status: AC
Start: 2016-11-05 — End: ?

## 2016-11-05 MED ORDER — LABETALOL 5 MG/ML IV SOLN
5 mg/mL | INTRAVENOUS | Status: DC | PRN
Start: 2016-11-05 — End: 2016-11-04

## 2016-11-05 MED ORDER — FENTANYL CITRATE (PF) 50 MCG/ML IJ SOLN
50 mcg/mL | INTRAMUSCULAR | Status: DC | PRN
Start: 2016-11-05 — End: 2016-11-04
  Administered 2016-11-05 (×2): via INTRAVENOUS

## 2016-11-05 MED ORDER — ONDANSETRON (PF) 4 MG/2 ML INJECTION
4 mg/2 mL | INTRAMUSCULAR | Status: DC | PRN
Start: 2016-11-05 — End: 2016-11-04
  Administered 2016-11-05: 02:00:00 via INTRAVENOUS

## 2016-11-05 MED ORDER — KETOROLAC TROMETHAMINE 30 MG/ML INJECTION
30 mg/mL (1 mL) | INTRAMUSCULAR | Status: AC
Start: 2016-11-05 — End: ?

## 2016-11-05 MED FILL — LEVOFLOXACIN IN D5W 500 MG/100 ML IV PIGGY BACK: 500 mg/100 mL | INTRAVENOUS | Qty: 100

## 2016-11-05 MED FILL — ONDANSETRON (PF) 4 MG/2 ML INJECTION: 4 mg/2 mL | INTRAMUSCULAR | Qty: 2

## 2016-11-05 MED FILL — HYDROMORPHONE (PF) 2 MG/ML IJ SOLN: 2 mg/mL | INTRAMUSCULAR | Qty: 1

## 2016-11-05 MED FILL — OPTIRAY 320 MG IODINE/ML INTRAVENOUS SOLUTION: 320 mg iodine/mL | INTRAVENOUS | Qty: 50

## 2016-11-05 MED FILL — KETOROLAC TROMETHAMINE 30 MG/ML INJECTION: 30 mg/mL (1 mL) | INTRAMUSCULAR | Qty: 30

## 2016-11-05 MED FILL — XYLOCAINE-MPF 20 MG/ML (2 %) INJECTION SOLUTION: 20 mg/mL (2 %) | INTRAMUSCULAR | Qty: 40

## 2016-11-05 MED FILL — TAMSULOSIN SR 0.4 MG 24 HR CAP: 0.4 mg | ORAL | Qty: 1

## 2016-11-05 MED FILL — DOK 100 MG CAPSULE: 100 mg | ORAL | Qty: 1

## 2016-11-05 MED FILL — SODIUM CHLORIDE 0.9 % IV: INTRAVENOUS | Qty: 1000

## 2016-11-05 MED FILL — MIDAZOLAM 1 MG/ML IJ SOLN: 1 mg/mL | INTRAMUSCULAR | Qty: 2

## 2016-11-05 MED FILL — LACTATED RINGERS IV: INTRAVENOUS | Qty: 500

## 2016-11-05 MED FILL — SENNA 8.6 MG TABLET: 8.6 mg | ORAL | Qty: 1

## 2016-11-05 MED FILL — VISIPAQUE 320 MG IODINE/ML INTRAVENOUS SOLUTION: 320 mg iodine/mL | INTRAVENOUS | Qty: 100

## 2016-11-05 MED FILL — ONDANSETRON (PF) 4 MG/2 ML INJECTION: 4 mg/2 mL | INTRAMUSCULAR | Qty: 4

## 2016-11-05 MED FILL — DIPRIVAN 10 MG/ML INTRAVENOUS EMULSION: 10 mg/mL | INTRAVENOUS | Qty: 200

## 2016-11-05 MED FILL — POLYETHYLENE GLYCOL 3350 17 GRAM (100 %) ORAL POWDER PACKET: 17 gram | ORAL | Qty: 1

## 2016-11-05 MED FILL — NORMAL SALINE FLUSH 0.9 % INJECTION SYRINGE: INTRAMUSCULAR | Qty: 10

## 2016-11-05 MED FILL — KETOROLAC TROMETHAMINE 30 MG/ML INJECTION: 30 mg/mL (1 mL) | INTRAMUSCULAR | Qty: 1

## 2016-11-05 MED FILL — ISOVUE-M 200  41 % INTRATHECAL SOLUTION: 200 mg iodine /mL (41 %) | INTRATHECAL | Qty: 20

## 2016-11-05 NOTE — Progress Notes (Signed)
Pt received oob in cahir no complaints offered call bell within reach.

## 2016-11-05 NOTE — Progress Notes (Signed)
Pt c/o some left sided discomfort and burning in urination. States he was told by the urologist that this is expected. Ambulatory. Voiding.   Cleared by urology for discharge.    Re-evaluation at 12 noon and if ok, can be discharged.

## 2016-11-05 NOTE — Progress Notes (Signed)
Problem: Falls - Risk of  Goal: *Absence of Falls  Document Schmid Fall Risk and appropriate interventions in the flowsheet.   Outcome: Progressing Towards Goal  Fall Risk Interventions:            Medication Interventions: Teach patient to arise slowly

## 2016-11-05 NOTE — Progress Notes (Signed)
Heplock removed discharge instructions given. Pt discharged home aware to follow up  With Dr. Phylliss Bobowe  Phone number provided.

## 2016-11-05 NOTE — Anesthesia Post-Procedure Evaluation (Signed)
Post-Anesthesia Evaluation and Assessment    Patient: Joel BreedWilliam Tidmore MRN: [id number]  SSN: [id number]    Date of Birth: 02/21/1951  Age: 66 y.o.  Sex: male       Cardiovascular Function/Vital Signs  Visit Vitals   ??? BP 112/61 (BP 1 Location: Left arm, BP Patient Position: At rest;Sitting)   ??? Pulse (!) 57   ??? Temp 37.3 ??C (99.1 ??F)   ??? Resp 18   ??? Ht 5\' 9"  (1.753 m)   ??? Wt 64.5 kg (142 lb 4.8 oz)   ??? SpO2 98%   ??? BMI 21.01 kg/m2       Patient is status post general anesthesia for Procedure(s):  CYSTOSCOPY; LEFT RETROGRADE PYELOGRAM; LEFT URETERAL STENT INSERTION .    Nausea/Vomiting: None    Postoperative hydration reviewed and adequate.    Pain:  Pain Scale 1: Numeric (0 - 10) (11/05/16 0801)  Pain Intensity 1: 0 (11/05/16 0801)   Managed    Neurological Status:   Neuro (WDL): Within Defined Limits (11/04/16 2059)   At baseline    Mental Status and Level of Consciousness: Arousable    Pulmonary Status:   O2 Device: Room air (11/05/16 0735)   Adequate oxygenation and airway patent    Complications related to anesthesia: None    Post-anesthesia assessment completed. No concerns    Signed By: Rose PhiAleksey A. Bronco Mcgrory, MD     November 05, 2016

## 2016-11-05 NOTE — Other (Signed)
Bedside and Verbal shift change report given to Ivette Ruiz (oncoming nurse) by Rob Zawadzki (offgoing nurse). Report included the following information SBAR, Procedure Summary, MAR and Recent Results.

## 2016-11-28 ENCOUNTER — Inpatient Hospital Stay: Admit: 2016-11-28 | Payer: PRIVATE HEALTH INSURANCE | Primary: Family Medicine

## 2016-11-28 ENCOUNTER — Encounter

## 2016-11-28 DIAGNOSIS — S4991XA Unspecified injury of right shoulder and upper arm, initial encounter: Secondary | ICD-10-CM

## 2016-12-01 ENCOUNTER — Inpatient Hospital Stay: Admit: 2016-12-01 | Payer: PRIVATE HEALTH INSURANCE | Primary: Family Medicine

## 2016-12-01 DIAGNOSIS — M79601 Pain in right arm: Secondary | ICD-10-CM

## 2016-12-01 NOTE — Progress Notes (Signed)
PT Daily Treatment Note    Patient Name: Joel Graham   Treatment Diagnosis: Pain in right arm [M79.601]  Strain of other muscles, fascia and tendons at shoulder and upper arm level, right arm, subsequent encounter [S46.811D]  Sprain of ligaments of cervical spine, subsequent encounter [S13.4XXD]   Referral Source: Donah DriverHuey, Sally W, NP     Date:12/01/2016  Visit #: 12/01/16     Treatment Area:  R UE  Date of Injury/Date of Surgery: 11/04/16  Protocol  []  Yes  Precautions/Restrictions: high cholesterol, hypothyroidism, enlarged prostate  Patients prefers to be treated behind curtain when appropriate:  []  Yes   Patient has therapist preference:   []  Male         []  Male         SUBJECTIVE  Pain Level (0-10 scale):               Pain Level (0-10 scale) post treatment:   Medication Changes: []  No    []  Yes (see paper medication log)  Subjective functional status/changes:   []  No changes reported    See IE     OBJECTIVE   Modality: [x]  see flow sheet     []  Other:_  Therapeutic Activities: [x]  see flow sheet     []  Other:_  Manual Therapy:  [x]  see flow sheet     []  Other:_    Patient Education: []  Review HEP    []  Progressed/Changed HEP  []  positioning   []  posture/body mechanics   []  transfers   []  heat/ice application    Other Objective/Functional Measures:  [x]  see evaluation/re-evaluation/progress         ASSESSMENT  [x]   See evaluation/progress note/reevaluation    Pt educated in repeated cervical retraction with ext and R rot for HEP.  Pt to perform 10 reps every 2-3 hours.      Short term/long term goals:  Patient has achieved goals:_  []   Patient will continue to benefit from skilled therapy to address remaining goals     PLAN  []   Upgrade activities as tolerated      [x]  Frequency/duration: 2-3x/wk for 6 wks  POC ends (date): 01/12/17  []   Continue plan of care    []   Discharge due to:_         Reather LittlerJonathan Kynadi Dragos  12/01/2016                                                                     Upper  Extremity Therapy Flow Sheet  Joel Graham     Classify Intervention   1 2 3 4 5 6 7 8 9 10    Date:  12/01/16            Non timed procedure              Evaluation/re-eval/ D/C  eval            Moist heat/cold pack   MH/CP CP x10'            Electrical Stimulation   ES             Timed procedure                STM/MFR MT  Joint mobilization MT Caudal traction 10x5"    MWM abd - painful    SMWAM - painful    p-L lateral glides C4/5            Manual stretching/PassiveROM MT             Ultrasound [place]             Exercise/Activity              Supine Deep Cervical Flex Iso  TE p            Scapular retractions/UT squeezes TE             Cane flex/ER TE             Cane ext/FIR TE             Supine SA punch/chest press TE p            Supine flexion TE p            Sidelying ER/habd/sacpt TE p            Prone rows/TYIs NMR             Stand flex/scapt TE p            Bicep curls TE             Single rows/Tricep kickbacks TE             Tband-Scap retract/Scap ext NMR             Tband-IR/ER/push forward TE p            Tband-Habd>diagnols NMR             UBE TE             Cable column TE             Forearm pronation/supination TE             Wrist 4 way TE             Weightbearing-wall circles>pushups TA             Wall ladder/pulleys TE             Stretches TE                                         HEP updated               Moist Heat/Cold Pack 16109   EStim 97014     MH/CP    ES 10            Manual Therapy 97140 MT 8            Ultrasound 97035  [place]             Therapeutic exercise 97110 TE 8            Therapeutic Activity 97530 TA             Neuromuscular Reeducation 97112  NMR             TOTAL TIME (in/out) 50            TOTAL BILLABLE TIME (MC 1 on 1) 50            Therapist's Initials  [facility]

## 2016-12-01 NOTE — Progress Notes (Signed)
[] [facility], [place], [place], Ph [phone]   Fax [phone]    [x] [address] [place], [place]  Phone [phone]  Fax [phone]    [] [facility], [place], [place]: [phone]  Fax: [phone]    Plan of Care/ Statement of Necessity for Physical Therapy Services    Joel Graham        Date: 12/01/2016   DOB: 10/23/1950  Donah Driver, NP  Diagnosis Codes: Pain in right arm [M79.601]  Strain of other muscles, fascia and tendons at shoulder and upper arm level, right arm, subsequent encounter [S46.811D]  Sprain of ligaments of cervical spine, subsequent encounter [S13.4XXD]  Onset Date: 11/04/16  Start of Care: 12/01/2016  Prior Level of Function: independent painfree ADL's  Comorbidities: hypothyroidism, high cholesterol, enlarged prostate    The Plan of Care and following information is based upon the initial evaluation.     Upper Extremity measures taken:       Medical intake forms       Quick DASH score 47.7%       Shoulder AROM equal to L shoulder / strength 5/5       Cervical AROM tightness with extension and L rotation    Assessment:   Pt is a 66 year old Male who presents with limited function secondary to below problem list following acute onset of R arm pain. Pt presents with postural deficits contributing to his R UE pain.  Pt with depressed R shoulder, upward rotation bil scap and anterior displacement of proximal end of clavicle.  Pt with possible cervical involvement contributing to R UE pain as well.  Pt able to abd arm with increased ease after lateral glide applied to C4/5. Pt with difficulty reaching overhead and outside secondary to pain. Pt will benefit from therapeutic intervention to address these problems and achieve the below goals.    Problem List:   pain affecting function, decrease ROM, decrease ADL/ functional abilitiies and decrease activity tolerance     Patient Goal (s): no pain  Rehabilitation Potential: good     Short Term Goals: To be accomplished in 2 weeks:  1)      The patient will demonstrate independence with HEP.  2)      The patient will demonstrate decreased pain 3 points to facilitate ability to perform ADLs with less difficulty.  Long Term Goals: To be accomplished in 6 weeks:   1)     The patient will be able to return to all household and full work activities without pain as demonstrated by Wrist/Hand Index <20%.   2)     The patient will be able to lift and reach overhead with up to 10 lbs secondary to full UE ROM and 5/5 strength.   3)     The patient will report decreased R UE pain to 0/10 to allow for undisturded sleep      Persons(s) to be included in education:   patient (P)  Patient / Family readiness to learn indicated by: interest  Barriers to Learning/Limitations: None  Patient/ Caregiver education and instruction: exercises  Are there abuse concerns?Patient is not abused    Plan:  Treatment Plan may include any combination of the following:  Evaluation  97162 - MEDIUM  Re-evaluation (97164)  Hot/cold pack (97010  Unattended electrical stimulation (97014)  Manual Therapy (03474)  Therapeutic Exercise (25956)  Therapeutic Activities (38756)  Neuromuscular Re-education [phone])  Frequency / Duration: Patient to  be seen 2-3 times per week for 6 weeks:      Therapist Signature: Reather LittlerJonathan Liev Brockbank Date: 12/01/2016   Certification Period: 6 wks - 01/12/17 Time: 9:55 AM       I certify that the above Therapy Services are being furnished while the patient is under my care. I agree with the treatment plan and certify that this therapy is necessary.  Y or N I have read the above and request that my patient continue as recommended.  Y or N I have read the above report and request that my patient continue therapy with the                following changes/special instructions  Y or N I have read the above report and request that my patient be discharged from therapy     Physician's Signature:____________________  Date:________________  Please sign and FAX back to appropriate hospital.

## 2016-12-01 NOTE — Progress Notes (Signed)
Physical Therapy Evaluation - Shoulder    Park BreedWilliam Graham  SUBJECTIVE  Date of Injury/surgery: 11/04/16  Chief Complaint/Mechanism of Injury: Pt reports having a medical procedure done, states his shoulder was uncomfortable during the procedure.  Pt states after the procedure his shoulder was hurting and he was getting pain down his arm past his elbow.  Pt to MD, x-ray performed negative for acute findings.  Pt provided with anti-inflammatory which seems to be helping.      Functional Status   Prior level of function: independent painfree ADL's   Present functional limitations: reaching overhead, reaching out to side, sleeping, eating, brushing teeth   What position do you sleep in?:    New medication/injections?:    Diagnostic Tests: []  Lab work [x]  X-rays    []  CT []  MRI     []  Other:  Results:    Symptoms  Pain rating (0-10):   Today:    Best: 0/10 arm by side, elbow bent, IR to stomach   Worst:10/10   []  Contstant:    [x]  Intermittent:     Aggravated by:   [x]  Reaching out to side [x]  Reaching behind [x]  Reaching Overhead   []  pushing/pulling [x]  lying on it                 []  Other:   Eased by:    [x]  Rest  []  Using it []  Ice  []  Heat  [x]  Other: medication    Posture: []  Poor    []  Fair    []  Good    Describe: upward rotation bil scap, R shoulder depressed, R AC anterior    Cervical Spine Screen/AROM: [x]  WNL    []  Limited %: Flex         Ext tightness R side     R rot         L rot tightness right side          R SB              L SB      Repeated Movement: seated retraction resolved tightness with L rotation, seated retraction with R rotation no effect on tightness with cervical extension    ROM:  []  Unable to assess at this time       []  All WNLs/WFLs                                               AROM                                                              PROM   Left Right ERP/PA  Left Right ERP/PA   Flexion [facility] WFL  Flexion      Extension    Extension      Scaption/ABD [facility] PA Scaptin/ABD       ER @ 0 Degrees    ER @ 0 Degrees      ER @ 90 Degrees occiput occiput  ER @ 90 Degrees      IR @ 90 Degrees    IR @ 90 Degrees      Functional IR  T12 L1  Functional ER        (ERP/PA=Endrange Pain/Painful Arc)    Strength:   []  Unable to assess at this time   []  All at least 4/5                                                                         L (1-5) R (1-5) Pain   Adductors 5 5 []  Yes   []  No   Abductors 5 5 []  Yes   []  No   External Rotators 5 5 []  Yes   []  No   Internal Rotators 5 5 []  Yes   []  No   Extensors   []  Yes   []  No   Serratus Anterior   []  Yes   []  No   Rhomboids   []  Yes   []  No   Middle Trapezius   []  Yes   []  No   Lower Trapezius   []  Yes   []  No   Elbow Flexion 5 5 []  Yes   []  No   Elbow Extension 5 5 []  Yes   []  No     Palpation  [x]  Min  []  Mod  []  Severe    Location: R supraspinatus and biceps tendonitis  []  Min  []  Mod  []  Severe    Location:  []  Min  []  Mod  []  Severe    Location:    Optional Tests:    Sulcus Sign  []  Pos   [x]  Neg Yergason's Test []  Pos   []  Neg  Roo's Test  []  Pos   []  Neg Gilchrist's Sign  []  Pos   []  Neg  Neer's Test  []  Pos   [x]  Neg Clunk Test  []  Pos   []  Neg  Hawkin's Test  []  Pos   [x]  Neg AC Joint  []  Pos   []  Neg  Speed's Test  []  Pos   []  Neg SC Joint  []  Pos   []  Neg  Empty Can  []  Pos   []  Neg Pectoral Tightness []  Pos   []  Neg  Anterior Apprehension []  Pos   []  Neg   Posterior Apprehension []  Pos   []  Neg    Other Tests / Comments:   ULTT - radial nerve neg, median nerve neg    Reather Littler  12/01/2016

## 2016-12-03 ENCOUNTER — Inpatient Hospital Stay: Admit: 2016-12-03 | Payer: PRIVATE HEALTH INSURANCE | Primary: Family Medicine

## 2016-12-03 NOTE — Progress Notes (Signed)
PT Daily Treatment Note    Patient Name: Joel Graham   Treatment Diagnosis: Pain in right arm [M79.601]  Strain of other muscles, fascia and tendons at shoulder and upper arm level, right arm, subsequent encounter [S46.811D]  Sprain of ligaments of cervical spine, subsequent encounter [S13.4XXD]   Referral Source: Donah Driver, NP     Date:12/03/2016  Visit #:2     Treatment Area:  R UE  Date of Injury/Date of Surgery: 11/04/16  Protocol  []  Yes  Precautions/Restrictions: high cholesterol, hypothyroidism, enlarged prostate  Patients prefers to be treated behind curtain when appropriate:  []  Yes   Patient has therapist preference:   []  Male         []  Male         SUBJECTIVE  Pain Level (0-10 scale):               Pain Level (0-10 scale) post treatment:   Medication Changes: []  No    []  Yes (see paper medication log)  Subjective functional status/changes:   []  No changes reported    Patient states he has had tingling on the back of his L arm for years.  He just started having it on the R.  Also noticed yesterday when he woke-up that his forearm and back of his arm felt more numb when he touched it.  He has not been able to sleep because he has been waking-up due to pain.  Today he was moving firewood around earlier and felt great, then came in the house and sat down and his shoulder was really bothering him.  Thinks the medication is helping a bit.  Feels good when moving and bad at rest.  Came to PT for his neck 4 years ago and felt like it got worse.  It got better on its own over time.  Was told in the past that he has more bone on the L side of his neck.     OBJECTIVE   Modality: [x]  see flow sheet     []  Other:_  Therapeutic Activities: [x]  see flow sheet     []  Other:_  Manual Therapy:  [x]  see flow sheet     []  Other:_    Patient Education: []  Review HEP    []  Progressed/Changed HEP  []  positioning   []  posture/body mechanics   []  transfers   []  heat/ice application     Other Objective/Functional Measures:  [x]  see evaluation/re-evaluation/progress         ASSESSMENT  [x]   See evaluation/progress note/reevaluation    Pt educated in holding off on repeated cervical retraction with ext and R rot for HEP for the time being.  He has been performing the reps every hour.  May consider resuming extensions in mid range.  Improved abduction AROM after tape applied.  Educated patient to remove tape if any irritation occurs.  No change in symptoms with manual cervical traction, side glides.  Increase in symptoms with seated cervical retraction.  Felt worse when on HP and not moving, decrease in symptoms once up and moving.    Short term/long term goals:  Patient has achieved goals:_  []   Patient will continue to benefit from skilled therapy to address remaining goals     PLAN  [x]   Upgrade activities as tolerated      [x]  Frequency/duration: 2-3x/wk for 6 wks  POC ends (date): 01/12/17  [x]   Continue plan of care    []   Discharge due to:_  Vira Blancolexis N Makynlee Kressin  12/03/2016                                                                     Upper Extremity Therapy Flow Sheet  Park BreedWilliam Pernice     Classify Intervention   1 2 3 4 5 6 7 8 9 10    Date:  12/01/16 12/03/16           Non timed procedure              Evaluation/re-eval/ D/C  eval            K-tape   Y strip lateral arm to A/P shoulder, I strip A to P           Moist heat/cold pack   MH/CP CP x10' HP EOS x 5'           Electrical Stimulation   ES             Timed procedure                STM/MFR MT  UT, interscap, cervical           Joint mobilization MT Caudal traction 10x5"    MWM abd - painful    SMWAM - painful    p-L lateral glides C4/5 2 x 10"                    X 2 grade 2           Manual stretching/PassiveROM MT             Ultrasound US             Exercise/Activity              Supine Deep Cervical Flex Iso  TE p 10 x 5"            TE             Scapular retractions/UT squeezes TE  X 10           Cane flex/ER TE              Cane ext/FIR TE             Supine SA punch/chest press TE p            Supine flexion TE p X 5           Sidelying ER/habd/sacpt TE p X 10 ER           Prone rows/TYIs NMR             Stand flex/scapt TE p            Bicep curls TE             Single rows/Tricep kickbacks TE             Tband-Scap retract/Scap ext NMR             Tband-IR/ER/push forward TE p            Tband-Habd>diagnols NMR             UBE TE  Cable column TE             Forearm pronation/supination TE             Wrist 4 way TE             Weightbearing-wall circles>pushups TA             Wall ladder/pulleys TE             Stretches TE                                         HEP updated               Moist Heat/Cold Pack 40981   EStim 97014     MH/CP    ES 10 5           Manual Therapy 97140 MT 8 15           Ultrasound 97035  [place]             Therapeutic exercise 97110 TE 8 15           Therapeutic Activity 97530 TA             Neuromuscular Reeducation 97112  NMR             TOTAL TIME (in/out) 50 40           TOTAL BILLABLE TIME (MC 1 on 1) 50 30           Therapist's Initials  J[facility] AW

## 2016-12-05 ENCOUNTER — Inpatient Hospital Stay: Admit: 2016-12-05 | Payer: PRIVATE HEALTH INSURANCE | Primary: Family Medicine

## 2016-12-05 NOTE — Progress Notes (Signed)
PT Daily Treatment Note    Patient Name: Joel Graham   Treatment Diagnosis: Pain in right arm [M79.601]  Strain of other muscles, fascia and tendons at shoulder and upper arm level, right arm, subsequent encounter [S46.811D]  Sprain of ligaments of cervical spine, subsequent encounter [S13.4XXD]   Referral Source: Donah Driver, NP     Date:12/05/2016  Visit #:2     Treatment Area:  R UE  Date of Injury/Date of Surgery: 11/04/16  Protocol  []  Yes  Precautions/Restrictions: high cholesterol, hypothyroidism, enlarged prostate  Patients prefers to be treated behind curtain when appropriate:  []  Yes   Patient has therapist preference:   []  Male         []  Male         SUBJECTIVE  Pain Level (0-10 scale):               Pain Level (0-10 scale) post treatment:   Medication Changes: []  No    []  Yes (see paper medication log)  Subjective functional status/changes:   []  No changes reported    Patient states he was mostly resting yesterday.  Did move some firewood and felt it in his arm.  Put a support under his arm on the L while sleeping.  Woke three times last night due to pain, which is typical, but it only took a few minutes to settle down as opposed to 45 minutes.  When he goes for his twice a daily walk he sometimes puts his hand in his pocket because letting it hang down bothers it.     OBJECTIVE   Modality: [x]  see flow sheet     []  Other:_  Therapeutic Activities: [x]  see flow sheet     []  Other:_  Manual Therapy:  [x]  see flow sheet     []  Other:_    Patient Education: []  Review HEP    []  Progressed/Changed HEP  []  positioning   []  posture/body mechanics   []  transfers   []  heat/ice application    Other Objective/Functional Measures:  [x]  see evaluation/re-evaluation/progress         ASSESSMENT  [x]   See evaluation/progress note/reevaluation    Pt advised to continue to avoid aggravating symptoms.  Symptoms less flared this session.  Activities with elbow fully extended aggravate  symptoms.  Patient had improved response to this session compared with last session.  Modified cervical matrix from eyes forward to eyes at 45 degrees, which did not aggravate symptoms.    Short term/long term goals:  Patient has achieved goals:_  []   Patient will continue to benefit from skilled therapy to address remaining goals     PLAN  [x]   Upgrade activities as tolerated      [x]  Frequency/duration: 2-3x/wk for 6 wks  POC ends (date): 01/12/17  [x]   Continue plan of care    []   Discharge due to:_         [name]  12/05/2016                                                                     Upper Extremity Therapy Flow Sheet  Joel Graham     Classify Intervention   1 2 3 4 5 6  7  8 9 10    Date:  12/01/16 12/03/16 12/05/16          Non timed procedure              Evaluation/re-eval/ D/C  eval            K-tape   Y strip lateral arm to A/P shoulder, I strip A to P Kept intact          Moist heat/cold pack   MH/CP CP x10' HP EOS x 5'           Electrical Stimulation   ES             Timed procedure                STM/MFR MT  UT, interscap, cervical x          Joint mobilization MT Caudal traction 10x5"    MWM abd - painful    SMWAM - painful    p-L lateral glides C4/5 2 x 10"                    X 2 grade 2           Manual stretching/PassiveROM MT             Ultrasound US             Exercise/Activity              Supine Deep Cervical Flex Iso  TE p 10 x 5"           Supine cervical retraction TE   10 x 5"          Scapular retractions/UT squeezes TE  X 10           Cervical matrix: 45 degrees rotation (forward step, trunk rotates towards leg, head kept in 45 degrees rotation) TE   X 10 B          Cane ext/FIR TE             Supine SA punch/chest press TE p  2# with elbows supinated and flexed x 10          Supine flexion TE p X 5           Sidelying ER/habd/sacpt TE p X 10 ER           B ER with band TE   Red 2 x 10          Prone rows/TYIs TE   High to low 2 x 10 red           Stand flex/scapt TE p            Posture overcorrection TE   20 x 5"          Single rows/Tricep kickbacks TE             Tband-Scap retract/Scap ext NMR             Tband-IR/ER/push forward TE p            Tband-Habd>diagnols NMR             UBE TE             Cable column TE             Forearm pronation/supination TE             Wrist 4 way TE  Weightbearing-wall circles>pushups TA             Wall ladder/pulleys TE             Stretches TE                                         HEP updated               Moist Heat/Cold Pack 16109   EStim 97014     MH/CP    ES 10 5           Manual Therapy 97140 MT 8 15 15           Ultrasound 97035  [place]             Therapeutic exercise 97110 TE 8 15 20           Therapeutic Activity 97530 TA             Neuromuscular Reeducation 97112  NMR             TOTAL TIME (in/out) 50 40 35          TOTAL BILLABLE TIME (MC 1 on 1) 50 30 35          Therapist's Initials  JH AW AW

## 2016-12-08 ENCOUNTER — Inpatient Hospital Stay: Admit: 2016-12-08 | Payer: PRIVATE HEALTH INSURANCE | Primary: Family Medicine

## 2016-12-08 NOTE — Progress Notes (Signed)
PT Daily Treatment Note    Patient Name: Joel Graham   Treatment Diagnosis: Pain in right arm [M79.601]  Strain of other muscles, fascia and tendons at shoulder and upper arm level, right arm, subsequent encounter [S46.811D]  Sprain of ligaments of cervical spine, subsequent encounter [S13.4XXD]   Referral Source: Donah Driver, NP     Date:12/08/2016  Visit #: 4     Treatment Area:  R UE  Date of Injury/Date of Surgery: 11/04/16  Protocol  []  Yes  Precautions/Restrictions: high cholesterol, hypothyroidism, enlarged prostate  Patients prefers to be treated behind curtain when appropriate:  []  Yes   Patient has therapist preference:   []  Male         []  Male         SUBJECTIVE  Pain Level (0-10 scale):               Pain Level (0-10 scale) post treatment:   Medication Changes: []  No    []  Yes (see paper medication log)  Subjective functional status/changes:   []  No changes reported    Pt states his R shoulder is feeling a little better.       OBJECTIVE   Modality: [x]  see flow sheet     []  Other:_  Therapeutic Activities: [x]  see flow sheet     []  Other:_  Manual Therapy:  [x]  see flow sheet     []  Other:_    Patient Education: []  Review HEP    []  Progressed/Changed HEP  []  positioning   []  posture/body mechanics   []  transfers   []  heat/ice application    Other Objective/Functional Measures:  []  see evaluation/re-evaluation/progress         ASSESSMENT  []   See evaluation/progress note/reevaluation    Pt presenting with improved posture, shoulder resting at equal ht.  Pt with positive median nerve signs.  Flossing performed to improved neural mobility to allow for less pain with reaching out to side.      Short term/long term goals:  Patient has achieved goals:_  []   Patient will continue to benefit from skilled therapy to address remaining goals     PLAN  [x]   Upgrade activities as tolerated      [x]  Frequency/duration: 2-3x/wk for 6 wks  POC ends (date): 01/12/17   [x]   Continue plan of care    []   Discharge due to:_         Reather Littler  12/08/2016                                                                     Upper Extremity Therapy Flow Sheet  [name]     Classify Intervention   1 2 3 4 5 6 7 8 9 10    Date:  12/01/16 12/03/16 12/05/16 12/08/16         Non timed procedure              Evaluation/re-eval/ D/C  eval            K-tape   Y strip lateral arm to A/P shoulder, I strip A to P Kept intact          Moist heat/cold pack   MH/CP CP x10' HP  EOS x 5'           Electrical Stimulation   ES             Timed procedure                STM/MFR MT  UT, interscap, cervical x TFM LHBT x5'         Joint mobilization MT Caudal traction 10x5"    MWM abd - painful    SMWAM - painful    p-L lateral glides C4/5 2 x 10"                    X 2 grade 2  Median nerve glide 45 deg abd and 90 deg  abd x10'         Manual stretching/PassiveROM MT             Ultrasound US             Exercise/Activity              Supine Deep Cervical Flex Iso  TE p 10 x 5"           Supine cervical retraction TE   10 x 5"          Scapular retractions/UT squeezes TE  X 10           Cervical matrix: 45 degrees rotation (forward step, trunk rotates towards leg, head kept in 45 degrees rotation) TE   X 10 B 2x10         Cane ext/FIR TE             Supine SA punch/chest press TE p  2# with elbows supinated and flexed x 10 2x10         Supine flexion TE p X 5           Sidelying ER/habd/sacpt TE p X 10 ER           B ER with band TE   Red 2 x 10 x         Prone rows/TYIs TE   High to low 2 x 10 red x         Stand flex/scapt TE p            Posture overcorrection TE   20 x 5"          Single rows/Tricep kickbacks TE             Tband-Scap retract/Scap ext NMR             Tband-IR/ER/push forward TE p            Tband-Habd>diagnols NMR             UBE TE             Cable column TE             Forearm pronation/supination TE             Wrist 4 way TE              Weightbearing-wall circles>pushups TA             Wall ladder/pulleys TE             Stretches TE  HEP updated               Moist Heat/Cold Pack 16109   EStim 97014     MH/CP    ES 10 5           Manual Therapy 97140 MT 8 15 15 15          Ultrasound 97035  [place]             Therapeutic exercise 97110 TE 8 15 20 15          Therapeutic Activity 97530 TA             Neuromuscular Reeducation 97112  NMR             TOTAL TIME (in/out) 50 40 35 30         TOTAL BILLABLE TIME (MC 1 on 1) 50 30 35 30         Therapist's Initials  JH AW AW [facility]

## 2016-12-10 ENCOUNTER — Inpatient Hospital Stay: Admit: 2016-12-10 | Payer: PRIVATE HEALTH INSURANCE | Primary: Family Medicine

## 2016-12-10 NOTE — Progress Notes (Signed)
PT Daily Treatment Note    Patient Name: Joel Graham   Treatment Diagnosis: Pain in right arm [M79.601]  Strain of other muscles, fascia and tendons at shoulder and upper arm level, right arm, subsequent encounter [S46.811D]  Sprain of ligaments of cervical spine, subsequent encounter [S13.4XXD]   Referral Source: Donah DriverHuey, Sally W, NP     Date:12/10/2016  Visit #: 5     Treatment Area:  R UE  Date of Injury/Date of Surgery: 11/04/16  Protocol  []  Yes  Precautions/Restrictions: high cholesterol, hypothyroidism, enlarged prostate  Patients prefers to be treated behind curtain when appropriate:  []  Yes   Patient has therapist preference:   []  Male         []  Male         SUBJECTIVE  Pain Level (0-10 scale):               Pain Level (0-10 scale) post treatment:   Medication Changes: []  No    []  Yes (see paper medication log)  Subjective functional status/changes:   []  No changes reported    Pt states his forearm feels tight.      OBJECTIVE   Modality: []  see flow sheet     []  Other:_  Therapeutic Activities: [x]  see flow sheet     []  Other:_  Manual Therapy:  [x]  see flow sheet     []  Other:_    Patient Education: []  Review HEP    []  Progressed/Changed HEP  []  positioning   []  posture/body mechanics   []  transfers   []  heat/ice application    Other Objective/Functional Measures:  []  see evaluation/re-evaluation/progress         ASSESSMENT  []   See evaluation/progress note/reevaluation    Pt with poor response to stretching and STM, no change in forearm symptoms.  Pt with painfree cervical extension when R lateral glide applied to C4/5.      Short term/long term goals:  Patient has achieved goals:_  []   Patient will continue to benefit from skilled therapy to address remaining goals     PLAN  [x]   Upgrade activities as tolerated      [x]  Frequency/duration: 2-3x/wk for 6 wks  POC ends (date): 01/12/17  [x]   Continue plan of care    []   Discharge due to:_         Reather LittlerJonathan Tabathia Knoche  12/10/2016                                                                      Upper Extremity Therapy Flow Sheet  Joel BreedWilliam Horiuchi     Classify Intervention   1 2 3 4 5 6 7 8 9 10    Date:  12/01/16 12/03/16 12/05/16 12/08/16 12/10/16        Non timed procedure              Evaluation/re-eval/ D/C  eval            K-tape   Y strip lateral arm to A/P shoulder, I strip A to P Kept intact          Moist heat/cold pack   MH/CP CP x10' HP EOS x 5'  Electrical Stimulation   ES             Timed procedure                STM/MFR MT  UT, interscap, cervical x TFM LHBT x5' STM forearm x5'        Joint mobilization MT Caudal traction 10x5"    MWM abd - painful    SMWAM - painful    p-L lateral glides C4/5 2 x 10"                    X 2 grade 2  Median nerve glide 45 deg abd and 90 deg  abd x10' R lateral glide with cervical extension x10        Manual stretching/PassiveROM MT             Ultrasound [place]             Exercise/Activity              Supine Deep Cervical Flex Iso  TE p 10 x 5"           Supine cervical retraction TE   10 x 5"          Scapular retractions/UT squeezes TE  X 10           Cervical matrix: 45 degrees rotation (forward step, trunk rotates towards leg, head kept in 45 degrees rotation) TE   X 10 B 2x10 x        Cane ext/FIR TE             Supine SA punch/chest press TE p  2# with elbows supinated and flexed x 10 2x10 x        Supine flexion TE p X 5           Sidelying ER/habd/sacpt TE p X 10 ER           B ER with band TE   Red 2 x 10 x Supine red 2x10        B Horz Abd TE     Supine red 2x10        Prone rows/TYIs TE   High to low 2 x 10 red x x        Stand flex/scapt TE p            Posture overcorrection TE   20 x 5"          Single rows/Tricep kickbacks TE             Tband-Scap retract/Scap ext NMR             Tband-IR/ER/push forward TE p            Tband-Habd>diagnols NMR             UBE TE             Cable column TE             Forearm pronation/supination TE             Wrist 4 way TE              Weightbearing-wall circles>pushups TA             Wall ladder/pulleys TE             Stretches TE  HEP updated               Moist Heat/Cold Pack 16109   EStim 97014     MH/CP    ES 10 5           Manual Therapy 97140 MT 8 15 15 15 10         Ultrasound 97035  [place]             Therapeutic exercise 97110 TE 8 15 20 15 20         Therapeutic Activity 97530 TA             Neuromuscular Reeducation 97112  NMR             TOTAL TIME (in/out) 50 40 35 30 30        TOTAL BILLABLE TIME (MC 1 on 1) 50 30 35 30 30        Therapist's Initials  JH AW AW [facility]

## 2016-12-12 ENCOUNTER — Inpatient Hospital Stay: Admit: 2016-12-12 | Payer: PRIVATE HEALTH INSURANCE | Primary: Family Medicine

## 2016-12-12 NOTE — Progress Notes (Signed)
PT Daily Treatment Note    Patient Name: Joel Graham   Treatment Diagnosis: Pain in right arm [M79.601]  Strain of other muscles, fascia and tendons at shoulder and upper arm level, right arm, subsequent encounter [S46.811D]  Sprain of ligaments of cervical spine, subsequent encounter [S13.4XXD]   Referral Source: Donah DriverHuey, Sally W, NP     Date:12/12/2016  Visit #: 6     Treatment Area:  R UE  Date of Injury/Date of Surgery: 11/04/16  Protocol  []  Yes  Precautions/Restrictions: high cholesterol, hypothyroidism, enlarged prostate  Patients prefers to be treated behind curtain when appropriate:  []  Yes   Patient has therapist preference:   []  Male         []  Male         SUBJECTIVE  Pain Level (0-10 scale):               Pain Level (0-10 scale) post treatment:   Medication Changes: []  No    []  Yes (see paper medication log)  Subjective functional status/changes:   []  No changes reported    Pt states his shoulder and arm are feeling better. Reports being able to lye on his R side now.  Pt also reports being able to lift his arm up higher.       OBJECTIVE   Modality: []  see flow sheet     []  Other:_  Therapeutic Activities: [x]  see flow sheet     []  Other:_  Manual Therapy:  [x]  see flow sheet     []  Other:_    Patient Education: []  Review HEP    []  Progressed/Changed HEP  []  positioning   []  posture/body mechanics   []  transfers   []  heat/ice application    Other Objective/Functional Measures:  []  see evaluation/re-evaluation/progress         ASSESSMENT  []   See evaluation/progress note/reevaluation    Pt making steady progress with improved AROM R shoulder abduction before onset of pain.  Pt advised to decrease to 2x/wk.      Short term/long term goals:  Patient has achieved goals:_  []   Patient will continue to benefit from skilled therapy to address remaining goals     PLAN  [x]   Upgrade activities as tolerated      [x]  Frequency/duration: 2-3x/wk for 6 wks  POC ends (date): 01/12/17   [x]   Continue plan of care    []   Discharge due to:_         Reather LittlerJonathan Renso Graham  12/12/2016                                                                     Upper Extremity Therapy Flow Sheet  Joel BreedWilliam Graham     Classify Intervention   1 2 3 4 5 6 7 8 9 10    Date:  12/01/16 12/03/16 12/05/16 12/08/16 12/10/16 12/12/16       Non timed procedure              Evaluation/re-eval/ D/C  eval            K-tape   Y strip lateral arm to A/P shoulder, I strip A to P Kept intact          Moist  heat/cold pack   MH/CP CP x10' HP EOS x 5'           Electrical Stimulation   ES             Timed procedure                STM/MFR MT  UT, interscap, cervical x TFM LHBT x5' STM forearm x5' x       Joint mobilization MT Caudal traction 10x5"    MWM abd - painful    SMWAM - painful    p-L lateral glides C4/5 2 x 10"                    X 2 grade 2  Median nerve glide 45 deg abd and 90 deg  abd x10' R lateral glide with cervical extension x10        Manual stretching/PassiveROM MT             Ultrasound [place]             Exercise/Activity              Supine Deep Cervical Flex Iso  TE p 10 x 5"           Supine cervical retraction TE   10 x 5"          Scapular retractions/UT squeezes TE  X 10           Cervical matrix: 45 degrees rotation (forward step, trunk rotates towards leg, head kept in 45 degrees rotation) TE   X 10 B 2x10 x x       Cane ext/FIR TE             Supine SA punch/chest press TE p  2# with elbows supinated and flexed x 10 2x10 x 3# 2x10       Supine flexion TE p X 5           Sidelying ER/habd/sacpt TE p X 10 ER           B ER with band TE   Red 2 x 10 x Supine red 2x10 Green 2x10       B Horz Abd TE     Supine red 2x10 Green 2x10       Prone rows/TYIs TE   High to low 2 x 10 red x x x       Stand flex/scapt TE p            Posture overcorrection TE   20 x 5"          Single rows/Tricep kickbacks TE             Tband-Scap retract/Scap ext NMR             Tband-IR/ER/push forward TE p             Tband-Habd>diagnols NMR             UBE TE             Cable column TE             Forearm pronation/supination TE             Wrist 4 way TE             Weightbearing-wall circles>pushups TA             Wall ladder/pulleys TE  Stretches TE      Wrist flex stretch elbow bent 3x30"  elbow straight 3x30"                                   HEP updated               Moist Heat/Cold Pack 16109   EStim 97014     MH/CP    ES 10 5           Manual Therapy 97140 MT 8 15 15 15 10 5        Ultrasound 97035  [place]             Therapeutic exercise 97110 TE 8 15 20 15 20 25        Therapeutic Activity 97530 TA             Neuromuscular Reeducation 97112  NMR             TOTAL TIME (in/out) 50 40 35 30 30 30        TOTAL BILLABLE TIME (MC 1 on 1) 50 30 35 30 30 30        Therapist's Initials  JH AW AW [facility]

## 2016-12-17 ENCOUNTER — Inpatient Hospital Stay: Admit: 2016-12-17 | Payer: PRIVATE HEALTH INSURANCE | Primary: Family Medicine

## 2016-12-17 NOTE — Progress Notes (Signed)
PT Daily Treatment Note    Patient Name: Joel Graham   Treatment Diagnosis: Pain in right arm [M79.601]  Strain of other muscles, fascia and tendons at shoulder and upper arm level, right arm, subsequent encounter [S46.811D]  Sprain of ligaments of cervical spine, subsequent encounter [S13.4XXD]   Referral Source: Donah Driver, NP     Date:12/17/2016  Visit #: 7     Treatment Area:  R UE  Date of Injury/Date of Surgery: 11/04/16  Protocol  []  Yes  Precautions/Restrictions: high cholesterol, hypothyroidism, enlarged prostate  Patients prefers to be treated behind curtain when appropriate:  []  Yes   Patient has therapist preference:   []  Male         []  Male         SUBJECTIVE  Pain Level (0-10 scale):               Pain Level (0-10 scale) post treatment:   Medication Changes: []  No    []  Yes (see paper medication log)  Subjective functional status/changes:   []  No changes reported    Pt states his arm bothered him more yesterday.  They went on a trip and he drove 2 hours each direction.     OBJECTIVE   Modality: []  see flow sheet     []  Other:_  Therapeutic Activities: [x]  see flow sheet     []  Other:_  Manual Therapy:  [x]  see flow sheet     []  Other:_    Patient Education: []  Review HEP    []  Progressed/Changed HEP  []  positioning   []  posture/body mechanics   []  transfers   []  heat/ice application    Other Objective/Functional Measures:  []  see evaluation/re-evaluation/progress         ASSESSMENT  []   See evaluation/progress note/reevaluation    Pt making steady progress with improved AROM R shoulder abduction before onset of pain.  Pt advised to decrease to 2x/wk.      Short term/long term goals:  Patient has achieved goals:_  []   Patient will continue to benefit from skilled therapy to address remaining goals     PLAN  [x]   Upgrade activities as tolerated      [x]  Frequency/duration: 2-3x/wk for 6 wks  POC ends (date): 01/12/17  [x]   Continue plan of care    []   Discharge due to:_         [name]   12/17/2016                                                                     Upper Extremity Therapy Flow Sheet  [name]     Classify Intervention   1 2 3 4 5 6 7 8 9 10    Date:  12/01/16 12/03/16 12/05/16 12/08/16 12/10/16 12/12/16 12/17/16      Non timed procedure              Evaluation/re-eval/ D/C  eval            K-tape   Y strip lateral arm to A/P shoulder, I strip A to P Kept intact          Moist heat/cold pack   MH/CP CP x10' HP EOS x 5'  Electrical Stimulation   ES             Timed procedure                STM/MFR MT  UT, interscap, cervical x TFM LHBT x5' STM forearm x5' x x      Joint mobilization MT Caudal traction 10x5"    MWM abd - painful    SMWAM - painful    p-L lateral glides C4/5 2 x 10"                    X 2 grade 2  Median nerve glide 45 deg abd and 90 deg  abd x10' R lateral glide with cervical extension x10        Manual stretching/PassiveROM MT             Ultrasound US             Exercise/Activity              Supine Deep Cervical Flex Iso  TE p 10 x 5"           Supine cervical retraction TE   10 x 5"          Scapular retractions/UT squeezes TE  X 10           Cervical matrix: 45 degrees rotation (forward step, trunk rotates towards leg, head kept in 45 degrees rotation) TE   X 10 B 2x10 x x x      Cane ext/FIR TE             Supine SA punch/chest press TE p  2# with elbows supinated and flexed x 10 2x10 x 3# 2x10 x      Supine flexion TE p X 5           Sidelying ER/habd/sacpt TE p X 10 ER           B ER with band TE   Red 2 x 10 x Supine red 2x10 Green 2x10 x      B Horz Abd TE     Supine red 2x10 Green 2x10 x      Prone rows/TYIs TE   High to low 2 x 10 red x x x x      Stand flex/scapt TE p            Posture overcorrection TE   20 x 5"          Single rows/Tricep kickbacks TE             Tband-Scap retract/Scap ext NMR             Tband-IR/ER/push forward TE p            Tband-Habd>diagnols NMR             UBE TE             Cable column TE              Forearm pronation/supination TE             Wrist 4 way TE             Weightbearing-wall circles>pushups TA             Wall ladder/pulleys TE             Stretches TE      Wrist flex stretch elbow bent 3x30"  elbow straight  3x30"                                   HEP updated               Moist Heat/Cold Pack 98119   EStim 97014     MH/CP    ES 10 5           Manual Therapy 97140 MT 8 15 15 15 10 5 15       Ultrasound 97035  [place]             Therapeutic exercise 97110 TE 8 15 20 15 20 25 25       Therapeutic Activity 97530 TA             Neuromuscular Reeducation 97112  NMR             TOTAL TIME (in/out) 50 40 35 30 30 30  40      TOTAL BILLABLE TIME (MC 1 on 1) 50 30 35 30 30 30  40      Therapist's Initials  JH AW AW [facility] JH JH AW

## 2016-12-19 ENCOUNTER — Inpatient Hospital Stay: Admit: 2016-12-19 | Payer: PRIVATE HEALTH INSURANCE | Primary: Family Medicine

## 2016-12-19 NOTE — Progress Notes (Signed)
PT Daily Treatment Note    Patient Name: Joel Graham   Treatment Diagnosis: Pain in right arm [M79.601]  Strain of other muscles, fascia and tendons at shoulder and upper arm level, right arm, subsequent encounter [S46.811D]  Sprain of ligaments of cervical spine, subsequent encounter [S13.4XXD]   Referral Source: Donah Driver, NP     Date:12/19/2016  Visit #: 8     Treatment Area:  R UE  Date of Injury/Date of Surgery: 11/04/16  Protocol   Yes  Precautions/Restrictions: high cholesterol, hypothyroidism, enlarged prostate  Patients prefers to be treated behind curtain when appropriate:   Yes   Patient has therapist preference:    Male          Male         SUBJECTIVE  Pain Level (0-10 scale):               Pain Level (0-10 scale) post treatment:   Medication Changes:  No     Yes (see paper medication log)  Subjective functional status/changes:    No changes reported    Pt c/o "numbness" mid biceps down into forearm.  Pt states donning jacket is painful.  Pt reports pain is not in his shoulder but in his arm.       OBJECTIVE   Modality:  see flow sheet      Other:_  Therapeutic Activities:  see flow sheet      Other:_  Manual Therapy:   see flow sheet      Other:_    Patient Education:  Review HEP     Progressed/Changed HEP   positioning    posture/body mechanics    transfers    heat/ice application    Other Objective/Functional Measures:   see evaluation/re-evaluation/progress         ASSESSMENT    See evaluation/progress note/reevaluation    Pt remains with symptomatic active shoulder abduction.  Pt with no change in symptoms with manual therapy.  Pt advised to schedule appointment with MD for consultation.       Short term/long term goals:  Patient has achieved goals:_    Patient will continue to benefit from skilled therapy to address remaining goals     PLAN    Upgrade activities as tolerated       Frequency/duration: 2-3x/wk for 6 wks   POC ends (date): 01/12/17    Continue plan of care      Discharge due to:_         Reather Littler  12/19/2016                                                                     Upper Extremity Therapy Flow Sheet  Joel Graham     Classify Intervention   Date:  12/01/16 12/03/16 12/05/16 12/08/16 12/10/16 12/12/16 12/17/16 12/19/16     Non timed procedure              Evaluation/re-eval/ D/C  eval            K-tape   Y strip lateral arm to A/P shoulder, I strip A to P Kept intact  Moist heat/cold pack   MH/CP CP x10' HP EOS x 5'           Electrical Stimulation   ES             Timed procedure                STM/MFR MT  UT, interscap, cervical x TFM LHBT x5' STM forearm x5' x x STM forearm    Cross hand release forearm    Cross hand release pop fossa     Joint mobilization MT Caudal traction 10x5"    MWM abd - painful    SMWAM - painful    p-L lateral glides C4/5 2 x 10"                    X 2 grade 2  Median nerve glide 45 deg abd and 90 deg  abd x10' R lateral glide with cervical extension x10   Median nerve glides at 0 deg abd and 45 deg abd     Manual stretching/PassiveROM MT             Ultrasound [place]             Exercise/Activity              Supine Deep Cervical Flex Iso  TE p 10 x 5"           Supine cervical retraction TE   10 x 5"          Scapular retractions/UT squeezes TE  X 10           Cervical matrix: 45 degrees rotation (forward step, trunk rotates towards leg, head kept in 45 degrees rotation) TE   X 10 B 2x10 x x x x     Cane ext/FIR TE             Supine SA punch/chest press TE p  2# with elbows supinated and flexed x 10 2x10 x 3# 2x10 x x     Supine flexion TE p X 5           Sidelying ER/habd/sacpt TE p X 10 ER           B ER with band TE   Red 2 x 10 x Supine red 2x10 Green 2x10 x x     B Horz Abd TE     Supine red 2x10 Green 2x10 x x     Prone rows/TYIs TE   High to low 2 x 10 red x x x x x     Stand flex/scapt TE p       Supine D2 flex 2x10      Posture overcorrection TE   20 x 5"          Single rows/Tricep kickbacks TE             Tband-Scap retract/Scap ext NMR             Tband-IR/ER/push forward TE p            Tband-Habd>diagnols NMR             UBE TE             Cable column TE             Forearm pronation/supination TE             Wrist 4 way TE  Weightbearing-wall circles>pushups TA             Wall ladder/pulleys TE             Stretches TE      Wrist flex stretch elbow bent 3x30"  elbow straight 3x30"                                   HEP updated               Moist Heat/Cold Pack 16109   EStim 97014     MH/CP    ES 10 5           Manual Therapy 97140 MT Ultrasound 97035  [place]             Therapeutic exercise 97110 TE Therapeutic Activity 97530 TA             Neuromuscular Reeducation 97112  NMR             TOTAL TIME (in/out) 50 40 35 40 40     TOTAL BILLABLE TIME (MC 1 on 1) 50 30 35 40 40     Therapist's Initials  [facility] AW AW [facility] AW [facility]

## 2016-12-22 ENCOUNTER — Inpatient Hospital Stay: Admit: 2016-12-22 | Payer: PRIVATE HEALTH INSURANCE | Primary: Family Medicine

## 2016-12-22 DIAGNOSIS — M79601 Pain in right arm: Secondary | ICD-10-CM

## 2016-12-22 NOTE — Progress Notes (Signed)
PT Daily Treatment Note    Patient Name: Joel Graham   Treatment Diagnosis: Pain in right arm [M79.601]  Strain of other muscles, fascia and tendons at shoulder and upper arm level, right arm, subsequent encounter [S46.811D]  Sprain of ligaments of cervical spine, subsequent encounter [S13.4XXD]   Referral Source: Donah Driver, NP     Date:12/22/2016  Visit #: 9     Treatment Area:  R UE  Date of Injury/Date of Surgery: 11/04/16  Protocol   Yes  Precautions/Restrictions: high cholesterol, hypothyroidism, enlarged prostate  Patients prefers to be treated behind curtain when appropriate:   Yes   Patient has therapist preference:    Male          Male         SUBJECTIVE  Pain Level (0-10 scale):               Pain Level (0-10 scale) post treatment:   Medication Changes:  No     Yes (see paper medication log)  Subjective functional status/changes:    No changes reported    Pt states his arm is feeling better.       OBJECTIVE   Modality:  see flow sheet      Other:_  Therapeutic Activities:  see flow sheet      Other:_  Manual Therapy:   see flow sheet      Other:_    Patient Education:  Review HEP     Progressed/Changed HEP   positioning    posture/body mechanics    transfers    heat/ice application    Other Objective/Functional Measures:   see evaluation/re-evaluation/progress         ASSESSMENT    See evaluation/progress note/reevaluation    Pt with significant improvement in active R shoulder abduction.  Able to actively abduct to approximately 145 deg before onset of symptoms vs 90 deg previous treatment.       Short term/long term goals:  Patient has achieved goals:_    Patient will continue to benefit from skilled therapy to address remaining goals     PLAN    Upgrade activities as tolerated       Frequency/duration: 2-3x/wk for 6 wks  POC ends (date): 01/12/17    Continue plan of care      Discharge due to:_         Reather Littler  12/22/2016                                                                      Upper Extremity Therapy Flow Sheet  Joel Graham     Classify Intervention   Date:  12/01/16 12/03/16 12/05/16 12/08/16 12/10/16 12/12/16 12/17/16 12/19/16 12/22/16    Non timed procedure              Evaluation/re-eval/ D/C  eval            K-tape   Y strip lateral arm to A/P shoulder, I strip A to P Kept intact          Moist heat/cold pack   MH/CP CP x10' HP EOS x 5'  Electrical Stimulation   ES             Timed procedure                STM/MFR MT  UT, interscap, cervical x TFM LHBT x5' STM forearm x5' x x STM forearm    Cross hand release forearm    Cross hand release pop fossa     Joint mobilization MT Caudal traction 10x5"    MWM abd - painful    SMWAM - painful    p-L lateral glides C4/5 2 x 10"                    X 2 grade 2  Median nerve glide 45 deg abd and 90 deg  abd x10' R lateral glide with cervical extension x10   Median nerve glides at 0 deg abd and 45 deg abd     Manual stretching/PassiveROM MT             Ultrasound [place]             Exercise/Activity              Supine Deep Cervical Flex Iso  TE p 10 x 5"           Supine cervical retraction TE   10 x 5"          Scapular retractions/UT squeezes TE  X 10           Cervical matrix: 45 degrees rotation (forward step, trunk rotates towards leg, head kept in 45 degrees rotation) TE   X 10 B 2x10 x x x x x20 L only    Head neutral x20 bil    Cane ext/FIR TE             Supine SA punch/chest press TE p  2# with elbows supinated and flexed x 10 2x10 x 3# 2x10 x x 4# 2x10    Supine flexion TE p X 5           Sidelying ER/habd/sacpt TE p X 10 ER           B ER with band TE   Red 2 x 10 x Supine red 2x10 Green 2x10 x x x    B Horz Abd TE     Supine red 2x10 Green 2x10 x x x    Prone rows/TYIs TE   High to low 2 x 10 red x x x x x Green 2x10    Stand flex/scapt TE p       Supine D2 flex 2x10 Green 2x10    Posture overcorrection TE   20 x 5"           Single rows/Tricep kickbacks TE             Tband-Scap retract/Scap ext TE         Low trap row green 2x10    Rows blue 2x10    Tband-IR/ER/push forward TE p        IR, ER, push forward Green 2x10 ea    Tband-Habd>diagnols NMR             UBE TE             Cable column TE             Forearm pronation/supination TE             Wrist 4 way TE  Weightbearing-wall circles>pushups TE         C/CCW x20    Wall ladder/pulleys TE             Stretches TE      Wrist flex stretch elbow bent 3x30"  elbow straight 3x30"                                   HEP updated               Moist Heat/Cold Pack 46962   EStim 97014     MH/CP    ES 10 5           Manual Therapy 97140 MT Ultrasound 97035  [place]             Therapeutic exercise 97110 TE Therapeutic Activity 97530 TA             Neuromuscular Reeducation 97112  NMR             TOTAL TIME (in/out) 50 40 35 40 40 30    TOTAL BILLABLE TIME (MC 1 on 1) 50 30 35 40 40 30    Therapist's Initials  J[facility] AW AW [facility] AW [facility]

## 2016-12-24 ENCOUNTER — Inpatient Hospital Stay: Admit: 2016-12-24 | Payer: PRIVATE HEALTH INSURANCE | Primary: Family Medicine

## 2016-12-24 NOTE — Progress Notes (Signed)
PT Daily Treatment Note    Patient Name: Joel Graham   Treatment Diagnosis: Pain in right arm [M79.601]  Strain of other muscles, fascia and tendons at shoulder and upper arm level, right arm, subsequent encounter [S46.811D]  Sprain of ligaments of cervical spine, subsequent encounter [S13.4XXD]   Referral Source: Donah Driver, NP     Date:12/24/2016  Visit #: 10     Treatment Area:  R UE  Date of Injury/Date of Surgery: 11/04/16  Protocol   Yes  Precautions/Restrictions: high cholesterol, hypothyroidism, enlarged prostate  Patients prefers to be treated behind curtain when appropriate:   Yes   Patient has therapist preference:    Male          Male         SUBJECTIVE  Pain Level (0-10 scale):               Pain Level (0-10 scale) post treatment:   Medication Changes:  No     Yes (see paper medication log)  Subjective functional status/changes:    No changes reported    Pt states his arm is so so.  Reports he is not getting the sharp pain but he his forearm still feels tight and numb.       OBJECTIVE   Modality:  see flow sheet      Other:_  Therapeutic Activities:  see flow sheet      Other:_  Manual Therapy:   see flow sheet      Other:_    Patient Education:  Review HEP     Progressed/Changed HEP   positioning    posture/body mechanics    transfers    heat/ice application    Other Objective/Functional Measures:   see evaluation/re-evaluation/progress         ASSESSMENT    See evaluation/progress note/reevaluation    Pt without increased symptoms with exercises.  Pt required VC to stabilize hips and lumbar spine and rotate through thoracic spine with cervical matrix.      Short term/long term goals:  Patient has achieved goals:_    Patient will continue to benefit from skilled therapy to address remaining goals     PLAN    Upgrade activities as tolerated       Frequency/duration: 2-3x/wk for 6 wks  POC ends (date): 01/12/17     Continue plan of care      Discharge due to:_         Reather Littler  12/24/2016                                                                     Upper Extremity Therapy Flow Sheet  Joel Graham     Classify Intervention   Date:  12/01/16 12/03/16 12/05/16 12/08/16 12/10/16 12/12/16 12/17/16 12/19/16 12/22/16 12/24/16   Non timed procedure              Evaluation/re-eval/ D/C  eval            K-tape   Y strip lateral arm to A/P shoulder, I strip A to P Kept intact          Moist heat/cold pack  MH/CP CP x10' HP EOS x 5'           Electrical Stimulation   ES             Timed procedure                STM/MFR MT  UT, interscap, cervical x TFM LHBT x5' STM forearm x5' x x STM forearm    Cross hand release forearm    Cross hand release pop fossa     Joint mobilization MT Caudal traction 10x5"    MWM abd - painful    SMWAM - painful    p-L lateral glides C4/5 2 x 10"                    X 2 grade 2  Median nerve glide 45 deg abd and 90 deg  abd x10' R lateral glide with cervical extension x10   Median nerve glides at 0 deg abd and 45 deg abd     Manual stretching/PassiveROM MT             Ultrasound [place]             Exercise/Activity              Supine Deep Cervical Flex Iso  TE p 10 x 5"           Supine cervical retraction TE   10 x 5"          Scapular retractions/UT squeezes TE  X 10           Cervical matrix: 45 degrees rotation (forward step, trunk rotates towards leg, head kept in 45 degrees rotation) TE   X 10 B 2x10 x x x x x20 L only    Head neutral x20 bil     x   Cane ext/FIR TE             Supine SA punch/chest press TE p  2# with elbows supinated and flexed x 10 2x10 x 3# 2x10 x x 4# 2x10 x   Supine flexion TE p X 5           Sidelying ER/habd/sacpt TE p X 10 ER           B ER with band TE   Red 2 x 10 x Supine red 2x10 Green 2x10 x x x x   B Horz Abd TE     Supine red 2x10 Green 2x10 x x x x   Prone rows/TYIs TE   High to low 2 x 10 red x x x x x Green 2x10 x    Stand flex/scapt TE p       Supine D2 flex 2x10 Green 2x10 x   Posture overcorrection TE   20 x 5"          Single rows/Tricep kickbacks TE             Tband-Scap retract/Scap ext TE         Low trap row green 2x10    Rows blue 2x10 X      x   Tband-IR/ER/push forward TE p        IR, ER, push forward Green 2x10 ea x   Tband-Habd>diagnols NMR             UBE TE             Cable column TE  Forearm pronation/supination TE             Wrist 4 way TE             Weightbearing-wall circles>pushups TE         C/CCW x20 x   Wall ladder/pulleys TE             Stretches TE      Wrist flex stretch elbow bent 3x30"  elbow straight 3x30"                                   HEP updated               Moist Heat/Cold Pack 16109   EStim 97014     MH/CP    ES 10 5           Manual Therapy 97140 MT Ultrasound 97035  [place]             Therapeutic exercise 97110 TE Therapeutic Activity 97530 TA             Neuromuscular Reeducation 97112  NMR             TOTAL TIME (in/out)   TOTAL BILLABLE TIME (MC 1 on 1)   Therapist's Initials  [facility] AW AW [facility] AW [facility]

## 2016-12-29 ENCOUNTER — Inpatient Hospital Stay: Admit: 2016-12-29 | Payer: PRIVATE HEALTH INSURANCE | Primary: Family Medicine

## 2016-12-29 NOTE — Progress Notes (Signed)
PT Daily Treatment Note    Patient Name: Joel Graham   Treatment Diagnosis: Pain in right arm [M79.601]  Strain of other muscles, fascia and tendons at shoulder and upper arm level, right arm, subsequent encounter [S46.811D]  Sprain of ligaments of cervical spine, subsequent encounter [S13.4XXD]   Referral Source: Donah Driver, NP     Date:12/29/2016  Visit #: 11     Treatment Area:  R UE  Date of Injury/Date of Surgery: 11/04/16  Protocol   Yes  Precautions/Restrictions: high cholesterol, hypothyroidism, enlarged prostate  Patients prefers to be treated behind curtain when appropriate:   Yes   Patient has therapist preference:    Male          Male         SUBJECTIVE  Pain Level (0-10 scale):               Pain Level (0-10 scale) post treatment:   Medication Changes:  No     Yes (see paper medication log)  Subjective functional status/changes:    No changes reported    Pt states his shoulder and arm are sore from working this weekend.  Pt reports his arm doesn't feel right when brushing his teeth.       OBJECTIVE   Modality:  see flow sheet      Other:_  Therapeutic Activities:  see flow sheet      Other:_  Manual Therapy:   see flow sheet      Other:_    Patient Education:  Review HEP     Progressed/Changed HEP   positioning    posture/body mechanics    transfers    heat/ice application    Other Objective/Functional Measures:   see evaluation/re-evaluation/progress         ASSESSMENT    See evaluation/progress note/reevaluation    Pt with minimal change in symptoms with treatment.  Pt advised to contact MD to set up consult.      Short term/long term goals:  Patient has achieved goals:_    Patient will continue to benefit from skilled therapy to address remaining goals     PLAN    Upgrade activities as tolerated       Frequency/duration: 2-3x/wk for 6 wks  POC ends (date): 01/12/17    Continue plan of care      Discharge due to:_          Reather Littler  12/29/2016                                                                     Upper Extremity Therapy Flow Sheet  [name]     Classify Intervention   Date:  12/01/16 12/03/16 12/05/16 12/08/16 12/10/16 12/12/16 12/17/16 12/19/16 12/22/16 12/24/16 12/29/16   Non timed procedure               Evaluation/re-eval/ D/C  eval             K-tape   Y strip lateral arm to A/P shoulder, I strip A to P Kept intact           Moist heat/cold pack   MH/CP  CP x10' HP EOS x 5'            Electrical Stimulation   ES              Timed procedure                 STM/MFR MT  UT, interscap, cervical x TFM LHBT x5' STM forearm x5' x x STM forearm    Cross hand release forearm    Cross hand release pop fossa   Cross hand release biceps   Joint mobilization MT Caudal traction 10x5"    MWM abd - painful    SMWAM - painful    p-L lateral glides C4/5 2 x 10"                    X 2 grade 2  Median nerve glide 45 deg abd and 90 deg  abd x10' R lateral glide with cervical extension x10   Median nerve glides at 0 deg abd and 45 deg abd   humeroulna traction    Radial head traction    Dorsal Volar glides radius/ulna    Ant radial head mobs   Manual stretching/PassiveROM MT              Ultrasound [place]              Exercise/Activity               Supine Deep Cervical Flex Iso  TE p 10 x 5"            Supine cervical retraction TE   10 x 5"           Scapular retractions/UT squeezes TE  X 10            Cervical matrix: 45 degrees rotation (forward step, trunk rotates towards leg, head kept in 45 degrees rotation) TE   X 10 B 2x10 x x x x x20 L only    Head neutral x20 bil     x    Cane ext/FIR TE              Supine SA punch/chest press TE p  2# with elbows supinated and flexed x 10 2x10 x 3# 2x10 x x 4# 2x10 x 5# 2x10   Supine flexion TE p X 5            Sidelying ER/habd/sacpt TE p X 10 ER            B ER with band TE   Red 2 x 10 x Supine red 2x10 Green 2x10 x x x x x    B Horz Abd TE     Supine red 2x10 Green 2x10 x x x x x   Prone rows/TYIs TE   High to low 2 x 10 red x x x x x Green 2x10 x x   Stand flex/scapt TE p       Supine D2 flex 2x10 Green 2x10 x x   Posture overcorrection TE   20 x 5"           Single rows/Tricep kickbacks TE              Tband-Scap retract/Scap ext TE         Low trap row green 2x10    Rows blue 2x10 X      x X      x   Tband-IR/ER/push forward TE p  IR, ER, push forward Green 2x10 ea x x   Tband-Habd>diagnols NMR              UBE TE              Cable column TE              Forearm pronation/supination TE              Wrist 4 way TE              Weightbearing-wall circles>pushups TE         C/CCW x20 x x   Wall ladder/pulleys TE              Stretches TE      Wrist flex stretch elbow bent 3x30"  elbow straight 3x30"                                      HEP updated                Moist Heat/Cold Pack 09811   EStim 97014     MH/CP    ES 10 5            Manual Therapy 97140 MT Ultrasound 97035  [place]              Therapeutic exercise 97110 TE Therapeutic Activity 97530 TA              Neuromuscular Reeducation 97112  NMR              TOTAL TIME (in/out) 45   TOTAL BILLABLE TIME (MC 1 on 1) 45   Therapist's Initials  [facility] AW AW [facility] AW [facility]

## 2016-12-29 NOTE — Progress Notes (Signed)
$'[]'V$ [facility], [place], [place], [place] [phone]   Fax [phone]    '[x]'$ [address] [place], [place]  Phone [phone]  Fax [phone]    '[]'$ [facility], [place], [place]: [phone]  Fax: [phone]  ______________________________________________________________________                                                                                  DISCHARGE SUMMARY    Joel Graham        Date: 04/28/2017   DOB: [birth year]  Cliffton Asters, NP  Diagnosis Codes: Pain in right arm [M79.601]  Strain of other muscles, fascia and tendons at shoulder and upper arm level, right arm, subsequent encounter [S46.811D]  Sprain of ligaments of cervical spine, subsequent encounter [S13.4XXD]  Onset Date: 11/04/16  Start of Care: 12/01/2016  Prior Level of Function: independent painfree ADL's  Comorbidities: hypothyroidism, high cholesterol, enlarged prostate  Visit from [facility]: 11  Missed Visits: 1    Notable Objective Findings: N/A non-visit D/C    Assessment: The patient has been      compliant with therapy attendance, along with      compliant with their home exercise program. They have made      minimal change in their functional status and summary of their goal attainment can be seen below. Pt referred back to MD secondary to minimal changes.      Goals: all goals D/C  Short Term Goals: To be accomplished in 2 weeks:  1)      The patient will demonstrate independence with HEP.  2)      The patient will demonstrate decreased pain 3 points to facilitate ability to perform ADLs with less difficulty.  Long Term Goals: To be accomplished in 6 weeks:                        1)     The patient will be able to return to all household and full work activities without pain as demonstrated by Wrist/Hand Index <20%.   2)     The patient will be able to lift and reach overhead with up to 10 lbs secondary to full UE ROM and 5/5 strength.    3)     The patient will report decreased R UE pain to 0/10 to allow for undisturded sleep     The patient is discharged from therapy services at this time because:  '[]'$  All goals have been achieved and patient is transitioned to a home exercise program  '[]'$  Goals have only been partially met, but patient has reached functional plateau  '[x]'$  Recommended to return to MD  '[]'$  Further therapy recommend, but patient self-discharged   '[]'$  Goals only partially met and additional visits requested from insurance company, but insurance directed discharge (self pay option offered to patient/patient declined)  '[]'$  MD directed or medical complications   '[]'$  Patient directed due to transportation or financial issues  '[]'$  The patient has been non compliant with attendance      Kari Baars 04/28/2017 9:47 AM  _______________________________________________________________________  I certify that the above Therapy Services are being  furnished while the patient is under my care. I agree with the treatment plan and certify that this therapy is necessary.  _0  I have read the above and request that my patient continue as recommended  _1  I have read the above report and request that my patient continue therapy with the following changes/special instructions:   _2  I have read the above report and request that my patient be discharged from therapy  Physician's Signature:________________________Date:________Time:_________  Please sign and FAX back to appropriate hospital.

## 2016-12-31 ENCOUNTER — Encounter: Payer: PRIVATE HEALTH INSURANCE | Primary: Family Medicine

## 2017-10-14 ENCOUNTER — Encounter

## 2017-10-15 ENCOUNTER — Inpatient Hospital Stay: Admit: 2017-10-15 | Payer: PRIVATE HEALTH INSURANCE | Attending: Orthopaedic Surgery | Primary: Family Medicine

## 2017-10-15 DIAGNOSIS — M25511 Pain in right shoulder: Secondary | ICD-10-CM

## 2018-08-27 ENCOUNTER — Inpatient Hospital Stay: Admit: 2018-08-27 | Payer: PRIVATE HEALTH INSURANCE | Primary: Family Medicine

## 2018-08-27 DIAGNOSIS — Z01818 Encounter for other preprocedural examination: Secondary | ICD-10-CM

## 2018-08-27 LAB — CBC W/O DIFF
ABSOLUTE NRBC: 0 10*3/uL (ref 0.0–0.01)
HCT: 41.6 % — ABNORMAL LOW (ref 42.0–52.0)
HGB: 13.9 g/dL — ABNORMAL LOW (ref 14.0–18.0)
MCH: 31 PG (ref 27.0–35.0)
MCHC: 33.4 g/dL (ref 30.7–37.3)
MCV: 92.9 FL (ref 81.0–94.0)
MPV: 9.3 FL (ref 9.2–11.8)
NRBC: 0 PER 100 WBC
PLATELET: 165 10*3/uL (ref 130–400)
RBC: 4.48 M/uL — ABNORMAL LOW (ref 4.70–6.00)
RDW: 12 % (ref 11.5–14.0)
WBC: 4.7 10*3/uL — ABNORMAL LOW (ref 4.8–10.6)

## 2018-08-27 LAB — TYPE & SCREEN
ABO/Rh(D): A POS
Antibody screen: NEGATIVE

## 2018-08-27 LAB — METABOLIC PANEL, BASIC
Anion gap: 6 mmol/L — ABNORMAL LOW (ref 8–20)
BUN: 28 mg/dL — ABNORMAL HIGH (ref 7–18)
CO2: 31 mmol/L (ref 21–32)
Calcium: 8.7 mg/dL (ref 8.5–10.1)
Chloride: 104 mmol/L (ref 98–107)
Creatinine: 0.7 mg/dL (ref 0.70–1.30)
GFR est AA: >60 mL/min/{1.73_m2} (ref >60)
GFR est non-AA: >60 mL/min/{1.73_m2} (ref >60)
Glucose: 93 mg/dL (ref 74–106)
Potassium: 4.1 mmol/L (ref 3.5–5.1)
Sodium: 141 mmol/L (ref 136–145)

## 2018-08-27 LAB — BASIC METABOLIC PANEL
Anion Gap: 6 mmol/L — ABNORMAL LOW (ref 8–20)
BUN: 28 mg/dL — ABNORMAL HIGH (ref 7–18)
CO2: 31 mmol/L (ref 21–32)
Calcium: 8.7 mg/dL (ref 8.5–10.1)
Chloride: 104 mmol/L (ref 98–107)
Creatinine: 0.7 mg/dL (ref 0.70–1.30)
EGFR IF NonAfrican American: >60 mL/min/{1.73_m2} (ref >60)
GFR African American: >60 mL/min/{1.73_m2} (ref >60)
Glucose: 93 mg/dL (ref 74–106)
Potassium: 4.1 mmol/L (ref 3.5–5.1)
Sodium: 141 mmol/L (ref 136–145)

## 2018-08-27 LAB — CBC
Hematocrit: 41.6 % — ABNORMAL LOW (ref 42.0–52.0)
Hemoglobin: 13.9 g/dL — ABNORMAL LOW (ref 14.0–18.0)
MCH: 31 PG (ref 27.0–35.0)
MCHC: 33.4 g/dL (ref 30.7–37.3)
MCV: 92.9 FL (ref 81.0–94.0)
MPV: 9.3 FL (ref 9.2–11.8)
NRBC Absolute: 0 10*3/uL (ref 0.0–0.01)
Nucleated RBCs: 0 PER 100 WBC
Platelets: 165 10*3/uL (ref 130–400)
RBC: 4.48 M/uL — ABNORMAL LOW (ref 4.70–6.00)
RDW: 12 % (ref 11.5–14.0)
WBC: 4.7 10*3/uL — ABNORMAL LOW (ref 4.8–10.6)

## 2018-08-27 LAB — TYPE AND SCREEN
ABO/Rh: A POS
Antibody Screen: NEGATIVE

## 2018-08-29 LAB — CULTURE, MRSA
Culture result:: NOT DETECTED
Culture: NOT DETECTED

## 2018-08-31 LAB — EKG, 12 LEAD, INITIAL
Atrial Rate: 50 {beats}/min
Calculated P Axis: 54 degrees
Calculated R Axis: 30 degrees
Calculated T Axis: 30 degrees
P-R Interval: 202 ms
Q-T Interval: 472 ms
QRS Duration: 128 ms
QTC Calculation (Bezet): 430 ms
Ventricular Rate: 50 {beats}/min

## 2018-08-31 LAB — EKG 12-LEAD
Atrial Rate: 50 {beats}/min
P Axis: 54 degrees
P-R Interval: 202 ms
Q-T Interval: 472 ms
QRS Duration: 128 ms
QTc Calculation (Bazett): 430 ms
R Axis: 30 degrees
T Axis: 30 degrees
Ventricular Rate: 50 {beats}/min

## 2018-09-08 ENCOUNTER — Ambulatory Visit: Admit: 2018-09-08 | Payer: PRIVATE HEALTH INSURANCE | Primary: Family Medicine

## 2018-09-08 ENCOUNTER — Inpatient Hospital Stay: Payer: PRIVATE HEALTH INSURANCE

## 2018-09-08 MED ORDER — VANCOMYCIN 1,000 MG IV SOLR
1000 mg | INTRAVENOUS | Status: AC
Start: 2018-09-08 — End: ?

## 2018-09-08 MED ORDER — HYDROMORPHONE (PF) 2 MG/ML IJ SOLN
2 mg/mL | INTRAMUSCULAR | Status: AC | PRN
Start: 2018-09-08 — End: 2018-09-08
  Administered 2018-09-08 (×4): via INTRAVENOUS

## 2018-09-08 MED ORDER — DEXAMETHASONE SODIUM PHOSPHATE 4 MG/ML IJ SOLN
4 mg/mL | INTRAMUSCULAR | Status: AC
Start: 2018-09-08 — End: 2018-09-08
  Administered 2018-09-08: 13:00:00 via PERINEURAL

## 2018-09-08 MED ORDER — PANTOPRAZOLE 40 MG TAB, DELAYED RELEASE
40 mg | Freq: Every day | ORAL | Status: DC
Start: 2018-09-08 — End: 2018-09-09
  Administered 2018-09-09: 11:00:00 via ORAL

## 2018-09-08 MED ORDER — ASPIRIN 325 MG TAB, DELAYED RELEASE
325 mg | Freq: Two times a day (BID) | ORAL | Status: DC
Start: 2018-09-08 — End: 2018-09-09
  Administered 2018-09-09: 13:00:00 via ORAL

## 2018-09-08 MED ORDER — CEFAZOLIN 2 GM/50 ML IN DEXTROSE (ISO-OSMOTIC) IVPB
2 gram/50 mL | INTRAVENOUS | Status: AC
Start: 2018-09-08 — End: 2018-09-08
  Administered 2018-09-08: 13:00:00 via INTRAVENOUS

## 2018-09-08 MED ORDER — FENTANYL CITRATE (PF) 50 MCG/ML IJ SOLN
50 mcg/mL | INTRAMUSCULAR | Status: AC
Start: 2018-09-08 — End: ?

## 2018-09-08 MED ORDER — OXYCODONE 5 MG TAB
5 mg | ORAL | Status: DC | PRN
Start: 2018-09-08 — End: 2018-09-09
  Administered 2018-09-09: 18:00:00 via ORAL

## 2018-09-08 MED ORDER — LIDOCAINE (PF) 10 MG/ML (1 %) IJ SOLN
10 mg/mL (1 %) | INTRAMUSCULAR | Status: AC
Start: 2018-09-08 — End: ?

## 2018-09-08 MED ORDER — ONDANSETRON (PF) 4 MG/2 ML INJECTION
4 mg/2 mL | INTRAMUSCULAR | Status: DC | PRN
Start: 2018-09-08 — End: 2018-09-08
  Administered 2018-09-08: 16:00:00 via INTRAVENOUS

## 2018-09-08 MED ORDER — PROPOFOL 10 MG/ML IV EMUL
10 mg/mL | INTRAVENOUS | Status: AC
Start: 2018-09-08 — End: ?

## 2018-09-08 MED ORDER — ACETAMINOPHEN 325 MG TABLET
325 mg | ORAL | Status: DC | PRN
Start: 2018-09-08 — End: 2018-09-09

## 2018-09-08 MED ORDER — PROPOFOL 10 MG/ML IV EMUL
10 mg/mL | INTRAVENOUS | Status: DC | PRN
Start: 2018-09-08 — End: 2018-09-08
  Administered 2018-09-08 (×2): via INTRAVENOUS

## 2018-09-08 MED ORDER — SODIUM CHLORIDE 0.9 % IJ SYRG
INTRAMUSCULAR | Status: DC | PRN
Start: 2018-09-08 — End: 2018-09-09

## 2018-09-08 MED ORDER — HYDROMORPHONE (PF) 2 MG/ML IJ SOLN
2 mg/mL | INTRAMUSCULAR | Status: DC | PRN
Start: 2018-09-08 — End: 2018-09-09

## 2018-09-08 MED ORDER — FENTANYL CITRATE (PF) 50 MCG/ML IJ SOLN
50 mcg/mL | INTRAMUSCULAR | Status: DC | PRN
Start: 2018-09-08 — End: 2018-09-08
  Administered 2018-09-08 (×6): via INTRAVENOUS

## 2018-09-08 MED ORDER — LACTATED RINGERS IV
INTRAVENOUS | Status: AC
Start: 2018-09-08 — End: 2018-09-09
  Administered 2018-09-09: 04:00:00 via INTRAVENOUS

## 2018-09-08 MED ORDER — ROPINIROLE 1 MG TAB
1 mg | Freq: Every day | ORAL | Status: DC
Start: 2018-09-08 — End: 2018-09-09
  Administered 2018-09-09: 01:00:00 via ORAL

## 2018-09-08 MED ORDER — MEPERIDINE (PF) 25 MG/ML INJ SOLUTION
25 mg/ml | INTRAMUSCULAR | Status: DC | PRN
Start: 2018-09-08 — End: 2018-09-08

## 2018-09-08 MED ORDER — TAMSULOSIN SR 0.4 MG 24 HR CAP
0.4 mg | ORAL | Status: AC
Start: 2018-09-08 — End: 2018-09-09
  Administered 2018-09-08: 23:00:00

## 2018-09-08 MED ORDER — OXYCODONE ER 10 MG TABLET,CRUSH RESISTANT,EXTENDED RELEASE 12 HR
10 mg | Freq: Two times a day (BID) | ORAL | Status: DC
Start: 2018-09-08 — End: 2018-09-09
  Administered 2018-09-08 – 2018-09-09 (×4): via ORAL

## 2018-09-08 MED ORDER — EPHEDRINE SULFATE 50 MG/ML INJECTION SOLUTION
50 mg/mL | INTRAMUSCULAR | Status: AC
Start: 2018-09-08 — End: ?

## 2018-09-08 MED ORDER — ONDANSETRON (PF) 4 MG/2 ML INJECTION
4 mg/2 mL | INTRAMUSCULAR | Status: DC | PRN
Start: 2018-09-08 — End: 2018-09-09
  Administered 2018-09-08: via INTRAVENOUS

## 2018-09-08 MED ORDER — BUPIVACAINE (PF) 0.5 % (5 MG/ML) IJ SOLN
0.5 % (5 mg/mL) | INTRAMUSCULAR | Status: AC
Start: 2018-09-08 — End: ?

## 2018-09-08 MED ORDER — DIPHENHYDRAMINE HCL 50 MG/ML IJ SOLN
50 mg/mL | Freq: Four times a day (QID) | INTRAMUSCULAR | Status: DC | PRN
Start: 2018-09-08 — End: 2018-09-09

## 2018-09-08 MED ORDER — OXYCODONE 5 MG TAB
5 mg | ORAL | Status: DC | PRN
Start: 2018-09-08 — End: 2018-09-09
  Administered 2018-09-09: 14:00:00 via ORAL

## 2018-09-08 MED ORDER — MIDAZOLAM 1 MG/ML IJ SOLN
1 mg/mL | INTRAMUSCULAR | Status: DC | PRN
Start: 2018-09-08 — End: 2018-09-08
  Administered 2018-09-08: 13:00:00 via INTRAVENOUS

## 2018-09-08 MED ORDER — POVIDONE-IODINE 10 % TOPICAL SOLN
10 % | CUTANEOUS | Status: DC | PRN
Start: 2018-09-08 — End: 2018-09-08
  Administered 2018-09-08: 14:00:00

## 2018-09-08 MED ORDER — TRANEXAMIC ACID 1,000 MG/10 ML (100 MG/ML) IV
1000 mg/10 mL (100 mg/mL) | INTRAVENOUS | Status: DC | PRN
Start: 2018-09-08 — End: 2018-09-08
  Administered 2018-09-08: 14:00:00

## 2018-09-08 MED ORDER — ACETAMINOPHEN 1,000 MG/100 ML (10 MG/ML) IV
1000 mg/100 mL (10 mg/mL) | Freq: Once | INTRAVENOUS | Status: AC
Start: 2018-09-08 — End: 2018-09-08
  Administered 2018-09-08: 17:00:00 via INTRAVENOUS

## 2018-09-08 MED ORDER — SODIUM CHLORIDE 0.9 % IJ SYRG
INTRAMUSCULAR | Status: DC | PRN
Start: 2018-09-08 — End: 2018-09-08

## 2018-09-08 MED ORDER — ATORVASTATIN 20 MG TAB
20 mg | Freq: Every day | ORAL | Status: DC
Start: 2018-09-08 — End: 2018-09-09

## 2018-09-08 MED ORDER — BUPIVACAINE (PF) 0.25 % (2.5 MG/ML) IJ SOLN
0.25 % (2.5 mg/mL) | INTRAMUSCULAR | Status: AC
Start: 2018-09-08 — End: ?

## 2018-09-08 MED ORDER — LACTATED RINGERS IV
INTRAVENOUS | Status: DC
Start: 2018-09-08 — End: 2018-09-08
  Administered 2018-09-08: 12:00:00 via INTRAVENOUS

## 2018-09-08 MED ORDER — LEVOTHYROXINE 50 MCG TAB
50 mcg | Freq: Every day | ORAL | Status: DC
Start: 2018-09-08 — End: 2018-09-09
  Administered 2018-09-09: 11:00:00 via ORAL

## 2018-09-08 MED ORDER — CHOLECALCIFEROL (VITAMIN D3) 1,000 UNIT (25 MCG) TAB
Freq: Every day | ORAL | Status: DC
Start: 2018-09-08 — End: 2018-09-09
  Administered 2018-09-09: 13:00:00 via ORAL

## 2018-09-08 MED ORDER — SODIUM CHLORIDE 0.9 % IJ SYRG
Freq: Three times a day (TID) | INTRAMUSCULAR | Status: DC
Start: 2018-09-08 — End: 2018-09-08

## 2018-09-08 MED ORDER — MIDAZOLAM 1 MG/ML IJ SOLN
1 mg/mL | INTRAMUSCULAR | Status: AC
Start: 2018-09-08 — End: ?

## 2018-09-08 MED ORDER — KETOROLAC TROMETHAMINE 15 MG/ML INJECTION
15 mg/mL | Freq: Four times a day (QID) | INTRAMUSCULAR | Status: AC
Start: 2018-09-08 — End: 2018-09-09
  Administered 2018-09-08 – 2018-09-09 (×4): via INTRAVENOUS

## 2018-09-08 MED ORDER — FINASTERIDE 5 MG TAB
5 mg | Freq: Every evening | ORAL | Status: DC
Start: 2018-09-08 — End: 2018-09-09
  Administered 2018-09-09: 01:00:00 via ORAL

## 2018-09-08 MED ORDER — VANCOMYCIN 1,000 MG IV SOLR
1000 mg | INTRAVENOUS | Status: DC | PRN
Start: 2018-09-08 — End: 2018-09-08
  Administered 2018-09-08: 14:00:00

## 2018-09-08 MED ORDER — SODIUM CHLORIDE 0.9 % IJ SYRG
Freq: Three times a day (TID) | INTRAMUSCULAR | Status: DC
Start: 2018-09-08 — End: 2018-09-09
  Administered 2018-09-08 – 2018-09-09 (×3): via INTRAVENOUS

## 2018-09-08 MED ORDER — CEFAZOLIN 2 GM/50 ML IN DEXTROSE (ISO-OSMOTIC) IVPB
2 gram/50 mL | Freq: Three times a day (TID) | INTRAVENOUS | Status: AC
Start: 2018-09-08 — End: 2018-09-08
  Administered 2018-09-08 – 2018-09-09 (×2): via INTRAVENOUS

## 2018-09-08 MED ORDER — TRANEXAMIC ACID 1,000 MG/10 ML (100 MG/ML) IV
1000 mg/10 mL (100 mg/mL) | INTRAVENOUS | Status: AC
Start: 2018-09-08 — End: ?

## 2018-09-08 MED ORDER — DEXAMETHASONE SODIUM PHOSPHATE 4 MG/ML IJ SOLN
4 mg/mL | INTRAMUSCULAR | Status: AC
Start: 2018-09-08 — End: ?

## 2018-09-08 MED ORDER — POLYETHYLENE GLYCOL 3350 17 GRAM (100 %) ORAL POWDER PACKET
17 gram | Freq: Every evening | ORAL | Status: DC
Start: 2018-09-08 — End: 2018-09-09
  Administered 2018-09-08: 22:00:00 via ORAL

## 2018-09-08 MED ORDER — DOCUSATE SODIUM 100 MG CAP
100 mg | Freq: Two times a day (BID) | ORAL | Status: DC
Start: 2018-09-08 — End: 2018-09-09
  Administered 2018-09-09 (×3): via ORAL

## 2018-09-08 MED ORDER — OTHER(NON-FORMULARY)
Status: DC | PRN
Start: 2018-09-08 — End: 2018-09-08
  Administered 2018-09-08: 14:00:00

## 2018-09-08 MED ORDER — TAMSULOSIN SR 0.4 MG 24 HR CAP
0.4 mg | Freq: Every day | ORAL | Status: DC
Start: 2018-09-08 — End: 2018-09-09
  Administered 2018-09-08: 22:00:00 via ORAL

## 2018-09-08 MED ORDER — BUPIVACAINE (PF) 0.25 % (2.5 MG/ML) IJ SOLN
0.25 % (2.5 mg/mL) | INTRAMUSCULAR | Status: AC
Start: 2018-09-08 — End: 2018-09-08
  Administered 2018-09-08: 13:00:00 via PERINEURAL

## 2018-09-08 MED ORDER — FENTANYL CITRATE (PF) 50 MCG/ML IJ SOLN
50 mcg/mL | INTRAMUSCULAR | Status: DC | PRN
Start: 2018-09-08 — End: 2018-09-08
  Administered 2018-09-08: 17:00:00 via INTRAVENOUS

## 2018-09-08 MED ORDER — METOCLOPRAMIDE 5 MG/ML IJ SOLN
5 mg/mL | INTRAMUSCULAR | Status: DC | PRN
Start: 2018-09-08 — End: 2018-09-08

## 2018-09-08 MED ORDER — EPHEDRINE SULFATE 50 MG/ML INJECTION SOLUTION
50 mg/mL | INTRAMUSCULAR | Status: DC | PRN
Start: 2018-09-08 — End: 2018-09-08
  Administered 2018-09-08 (×3): via INTRAVENOUS

## 2018-09-08 MED ORDER — BUPIVACAINE (PF) 0.75 % (7.5 MG/ML) IJ SOLN
0.75 % (7.5 mg/mL) | INTRAMUSCULAR | Status: DC | PRN
Start: 2018-09-08 — End: 2018-09-08
  Administered 2018-09-08: 13:00:00 via INTRATHECAL

## 2018-09-08 MED FILL — FENTANYL CITRATE (PF) 50 MCG/ML IJ SOLN: 50 mcg/mL | INTRAMUSCULAR | Qty: 2

## 2018-09-08 MED FILL — LACTATED RINGERS IV: INTRAVENOUS | Qty: 1000

## 2018-09-08 MED FILL — ONDANSETRON (PF) 4 MG/2 ML INJECTION: 4 mg/2 mL | INTRAMUSCULAR | Qty: 2

## 2018-09-08 MED FILL — HYDROMORPHONE (PF) 2 MG/ML IJ SOLN: 2 mg/mL | INTRAMUSCULAR | Qty: 1

## 2018-09-08 MED FILL — OXYCONTIN 10 MG TABLET,CRUSH RESISTANT,EXTENDED RELEASE: 10 mg | ORAL | Qty: 1

## 2018-09-08 MED FILL — VANCOMYCIN 1,000 MG IV SOLR: 1000 mg | INTRAVENOUS | Qty: 1000

## 2018-09-08 MED FILL — KETOROLAC TROMETHAMINE 15 MG/ML INJECTION: 15 mg/mL | INTRAMUSCULAR | Qty: 1

## 2018-09-08 MED FILL — CEFAZOLIN 2 GM/50 ML IN DEXTROSE (ISO-OSMOTIC) IVPB: 2 gram/50 mL | INTRAVENOUS | Qty: 50

## 2018-09-08 MED FILL — POLYETHYLENE GLYCOL 3350 17 GRAM (100 %) ORAL POWDER PACKET: 17 gram | ORAL | Qty: 1

## 2018-09-08 MED FILL — NORMAL SALINE FLUSH 0.9 % INJECTION SYRINGE: INTRAMUSCULAR | Qty: 40

## 2018-09-08 MED FILL — BUPIVACAINE (PF) 0.25 % (2.5 MG/ML) IJ SOLN: 0.25 % (2.5 mg/mL) | INTRAMUSCULAR | Qty: 10

## 2018-09-08 MED FILL — LIDOCAINE (PF) 10 MG/ML (1 %) IJ SOLN: 10 mg/mL (1 %) | INTRAMUSCULAR | Qty: 5

## 2018-09-08 MED FILL — BUPIVACAINE (PF) 0.5 % (5 MG/ML) IJ SOLN: 0.5 % (5 mg/mL) | INTRAMUSCULAR | Qty: 10

## 2018-09-08 MED FILL — DIPRIVAN 10 MG/ML INTRAVENOUS EMULSION: 10 mg/mL | INTRAVENOUS | Qty: 60

## 2018-09-08 MED FILL — OFIRMEV 1,000 MG/100 ML (10 MG/ML) INTRAVENOUS SOLUTION: 1000 mg/100 mL (10 mg/mL) | INTRAVENOUS | Qty: 100

## 2018-09-08 MED FILL — ROPINIROLE 0.25 MG TAB: 0.25 mg | ORAL | Qty: 2

## 2018-09-08 MED FILL — SODIUM CHLORIDE 0.9 % IRRIGATION SOLN: 0.9 % | Qty: 100

## 2018-09-08 MED FILL — EPHEDRINE SULFATE 50 MG/ML INTRAVENOUS SOLUTION: 50 mg/mL | INTRAVENOUS | Qty: 1

## 2018-09-08 MED FILL — MIDAZOLAM 1 MG/ML IJ SOLN: 1 mg/mL | INTRAMUSCULAR | Qty: 2

## 2018-09-08 MED FILL — DEXAMETHASONE SODIUM PHOSPHATE 4 MG/ML IJ SOLN: 4 mg/mL | INTRAMUSCULAR | Qty: 1

## 2018-09-08 MED FILL — TRANEXAMIC ACID 1,000 MG/10 ML (100 MG/ML) IV: 1000 mg/10 mL (100 mg/mL) | INTRAVENOUS | Qty: 20

## 2018-09-08 MED FILL — TAMSULOSIN SR 0.4 MG 24 HR CAP: 0.4 mg | ORAL | Qty: 1

## 2018-09-08 NOTE — Brief Op Note (Cosign Needed)
BRIEF OPERATIVE NOTE    Date of Procedure: 09/08/2018   Preoperative Diagnosis: Osteoarthritis of left knee, unspecified osteoarthritis type [M17.12]  Postoperative Diagnosis: Primary osteoarthritis left knee    Procedure(s):  LEFT TOTAL KNEE ARTHROPLASTY  Surgeon(s) and Role:     * Juliano, John S., MD - Primary         Surgical Assistant: Heaven Wandell PA-C     Surgical Staff:  Circ-1: Spychalski, Eun, RN  Physician Assistant: Coline Calkin A, PA  Radiology Technician: Durieux, Laura, RT, R  Scrub Tech-1: Fields, Jason L; Paliana, Helen  Event Time In Time Out   Incision Start 0820    Incision Close 1026      Anesthesia: Spinal   Estimated Blood Loss: 100cc  Specimens:   ID Type Source Tests Collected by Time Destination   A : BONE LEFT KNEE Preservative Knee, left  Juliano, John S., MD 09/08/2018 0838 Pathology      Findings: see dictation   Complications: none  Implants:   Implant Name Type Inv. Item Serial No. Manufacturer Lot No. LRB No. Used Action   CEMENT BONE SPD SIMPLEX 40 GR --  - SNA  CEMENT BONE SPD SIMPLEX 40 GR --  NA [employer] HOWM [id number] Left 2 Implanted   COMPNT FEM PS CEM TRIATHLN 6 L --  - SNA  COMPNT FEM PS CEM TRIATHLN 6 L --  NA [employer] HOWM [id number] Left 1 Implanted   INSERT TIB PS TRIATHLN X3 6 9 --  - SNA  INSERT TIB PS TRIATHLN X3 6 9 --  NA [employer] HOWM [id number] Left 1 Implanted   BASEPLT TIB REV UNIV SZ 6 R/L -- TRIATHLON - SNA  BASEPLT TIB REV UNIV SZ 6 R/L -- TRIATHLON NA [employer] HOW[employer] [id number] Left 1 Implanted   PAT ASYM TRIATHLN X3 [id number] -- TRIATHLON ASYMMETRIC X3 - SNA  PAT ASYM TRIATHLN X3 38X11MM -- TRIATHLON ASYMMETRIC X3 NA [employer] HOWM [id number] Left 1 Implanted

## 2018-09-08 NOTE — Progress Notes (Signed)
Problem: Self Care Deficits Care Plan (Adult)  Goal: *Acute Goals and Plan of Care (Insert Text)  Description  1) Issue adaptive equipment as needed then educate patient/family regarding the use of A/E or compensatory strategies to increase self care ability s/p TKR.  2) Train resident regarding precautions S/P TKR  3) increase LB dressing from S to Mod I utilizing A/E as issued by OT.      Note:   OCCUPATIONAL THERAPY EVALUATION    Patient: Joel Graham ([age] y.o. male)  Date: 09/08/2018  Primary Diagnosis: Osteoarthritis of left knee, unspecified osteoarthritis type [M17.12]  Primary osteoarthritis of left knee [M17.12]  Procedure(s) (LRB):  LEFT TOTAL KNEE ARTHROPLASTY (Left) Day of Surgery   Precautions:  WBAT    ASSESSMENT :  Based on the objective data described below, the patient presents with decreased ROM, strength and pain in L knee resulting in impaired self care ability.  Pt. Demonstrated bed mobility, bathroom mobility, toileting, transfers and ambulated 100'.    Patient will benefit from skilled intervention to address the above impairments.  Patient???s rehabilitation potential is considered to be Excellent  Factors which may influence rehabilitation potential include:   [x]              None noted  []              Mental ability/status  []              Medical condition  []              Home/family situation and support systems  []              Safety awareness  []              Pain tolerance/management  []              Other:      PLAN :  Recommendations and Planned Interventions:  [x]                Self Care Training                  [x]         Therapeutic Activities  []                Functional Mobility Training    []         Cognitive Retraining  [x]                Therapeutic Exercises           []         Endurance Activities  []                Balance Training                   []         Neuromuscular Re-Education  []                Visual/Perceptual Training     [x]    Home Safety Training   [x]                Patient Education                 []         Family Training/Education  []                Other (comment):    Frequency/Duration: Patient will be followed by occupational therapy 2 times a week to address goals.  Discharge  Recommendations: Home Health  Further Equipment Recommendations for Discharge: TBD     SUBJECTIVE:   Patient stated ???I feel pretty good right now.???    OBJECTIVE DATA SUMMARY:     Past Medical History:   Diagnosis Date    Arthritis     CAD (coronary artery disease)     cholesterol    Chronic kidney disease     stones    Enlarged prostate     Nausea & vomiting      Past Surgical History:   Procedure Laterality Date    HX KNEE ARTHROSCOPY Bilateral     torn meniscus    HX ORTHOPAEDIC Bilateral     Carpal tunnell    HX ORTHOPAEDIC Bilateral     heel spurs    HX OTHER SURGICAL  1990    hemmoroidectomy    HX UROLOGICAL       Prior Level of Function/Home Situation: Modified I in all self care   Home Situation  Home Environment: Private residence  # Steps to Enter: 2  Rails to Enter: No  One/Two Story Residence: One story  Living Alone: No  Support Systems: Games developerpouse/Significant Other/Partner  Patient Expects to be Discharged to:: Private residence  Current DME Used/Available at Home: Environmental consultantWalker, rolling  Tub or Shower Type: Tub/Shower combination  [x]   Right hand dominant   []   Left hand dominant  Cognitive/Behavioral Status:  Neurologic State: Alert;Appropriate for age  Orientation Level: Appropriate for age;Oriented X4  Cognition: Appropriate decision making;Appropriate for age attention/concentration;Appropriate safety awareness;Follows commands  Safety/Judgement: Awareness of environment;Insight into deficits                  Coordination:  Coordination: Within functional limits  Fine Motor Skills-Upper: Left Intact;Right Intact    Gross Motor Skills-Upper: Left Intact;Right Intact  Balance:  Sitting: Intact  Standing: Intact;With support  Strength:  BUE: 4/5   Strength: Within functional limits              Tone & Sensation:    Tone: Normal  Sensation: Intact                    Range of Motion:  BUE  AROM: Within functional limits  PROM: Within functional limits                    Functional Mobility and Transfers for ADLs:  Bed Mobility:  Rolling: Supervision  Supine to Sit: Supervision  Sit to Supine: Supervision     Transfers:  Sit to Stand: Supervision                Toilet Transfer : Supervision   Tub Transfer: Contact guard assistance       Bathroom Mobility: Supervision/set up  ADL Assessment:  Feeding: Independent                                       ADL Intervention:  Feeding  Feeding Assistance: Independent  Container Management: Independent  Cutting Food: Independent  Utensil Management: Independent  Food to Mouth: Independent  Drink to Mouth: Arts administratorndependent                             Toileting  Toileting Assistance: Supervision  Bladder Hygiene: Supervision  Bowel Hygiene: Supervision  Clothing Management: Supervision    Cognitive Retraining  Safety/Judgement: Awareness of environment;Insight into deficits    ADL Training:  Issued adaptive equipment to patient and then instructed patient on how to correctly use the equipment to increase LB dressing and hygiene ability.   Toileting with supervision and R/W for mobility, clothing mgt transfers and hygiene    Pain:  Pain Scale 1: Numeric (0 - 10)  Pain Intensity 1: 5  Pain Location 1: Knee  Pain Orientation 1: Left  Pain Description 1: Aching  Pain Intervention(s) 1: Medication (see MAR);Position;Ice  Activity Tolerance:   Fair +    Please refer to the flowsheet for vital signs taken during this treatment.  After treatment:   []  Patient left in no apparent distress sitting up in chair  [x]  Patient left in no apparent distress in bed  [x]  Call bell left within reach  [x]  Nursing notified  []  Caregiver present  []  Bed alarm activated    COMMUNICATION/EDUCATION:    The patient???s plan of care was discussed with: patient.  [x]  Home safety education was provided and the patient/caregiver indicated understanding.  [x]  Patient/family have participated as able in goal setting and plan of care.  [x]  Patient/family agree to work toward stated goals and plan of care.  []  Patient understands intent and goals of therapy, but is neutral about his/her participation.  []  Patient is unable to participate in goal setting and plan of care.  This patient???s plan of care is appropriate for delegation to OTA.    Thank you for this referral.  Altamese Carolina  Time Calculation: 45 mins

## 2018-09-08 NOTE — Other (Signed)
TRANSFER - OUT REPORT:    Verbal report given to Darrell JewelGenevieve Rebaya RN on Park BreedWilliam Dunavan  being transferred to Rm 403   for routine post - op       Report consisted of patient???s Situation, Background, Assessment and   Recommendations(SBAR).     Information from the following report(s) OR Summary, Procedure Summary, Intake/Output and MAR was reviewed with the receiving nurse.    Lines:   Peripheral IV 09/08/18 Left Hand (Active)   Site Assessment Clean, dry, & intact 09/08/2018 11:45 AM   Phlebitis Assessment 0 09/08/2018 11:45 AM   Infiltration Assessment 0 09/08/2018 11:45 AM   Dressing Status Clean, dry, & intact 09/08/2018 11:45 AM   Dressing Type Transparent 09/08/2018 11:45 AM   Hub Color/Line Status Infusing 09/08/2018 11:45 AM        Opportunity for questions and clarification was provided.      Patient transported with:   Registered Nurse

## 2018-09-08 NOTE — Progress Notes (Addendum)
Received patient from PACU alert and oriented x4, dressing to left knee clean, dry and intact, TEDs, venodynes and ice pack in use, good pedal pulse and cap refill. Patient states pain is down to 4. IVF infusing. IS at 2500. Call bell within reach.    1500 Patient OOB to chair and bathroom. Ambulated 125 ft with PT. Seen by hospitalist. Tolerating diet. Still due to void.    1600 Patient attempted to void in the bathroom but unsuccessful.  1645 Suprapubic distention and discomfort noted. Straight cath done, 800 cc of yellow urine drained.    1700 Seen by OT

## 2018-09-08 NOTE — H&P (Signed)
Left knee    Progressive pain in the left knee with symptoms continue despite activity modification and medication including injections.  Pain with activities of daily living and pain at night.  Plan for left knee replacement.      Past Medical History:   Diagnosis Date   ??? Arthritis    ??? CAD (coronary artery disease)     cholesterol   ??? Chronic kidney disease     stones   ??? Enlarged prostate    ??? Nausea & vomiting      Past Surgical History:   Procedure Laterality Date   ??? HX KNEE ARTHROSCOPY Bilateral     torn meniscus   ??? HX ORTHOPAEDIC Bilateral     Carpal tunnell   ??? HX ORTHOPAEDIC Bilateral     heel spurs   ??? HX OTHER SURGICAL  1990    hemmoroidectomy   ??? HX UROLOGICAL       Social History     Socioeconomic History   ??? Marital status: MARRIED     Spouse name: Not on file   ??? Number of children: Not on file   ??? Years of education: Not on file   ??? Highest education level: Not on file   Occupational History   ??? Not on file   Social Needs   ??? Financial resource strain: Not on file   ??? Food insecurity:     Worry: Not on file     Inability: Not on file   ??? Transportation needs:     Medical: Not on file     Non-medical: Not on file   Tobacco Use   ??? Smoking status: Former Smoker     Last attempt to quit: 11/03/1981     Years since quitting: 36.8   ??? Smokeless tobacco: Former User     Quit date: 08/02/1975   Substance and Sexual Activity   ??? Alcohol use: Yes     Comment: occ   ??? Drug use: No   ??? Sexual activity: Not on file   Lifestyle   ??? Physical activity:     Days per week: Not on file     Minutes per session: Not on file   ??? Stress: Not on file   Relationships   ??? Social connections:     Talks on phone: Not on file     Gets together: Not on file     Attends religious service: Not on file     Active member of club or organization: Not on file     Attends meetings of clubs or organizations: Not on file     Relationship status: Not on file   ??? Intimate partner violence:     Fear of current or ex partner: Not on file      Emotionally abused: Not on file     Physically abused: Not on file     Forced sexual activity: Not on file   Other Topics Concern   ??? Not on file   Social History Narrative   ??? Not on file     Review of systems: negative for fevers, chills, night sweats, nausea, vomiting, chest pain, shortness of breath, cough    Visit Vitals  BP 130/69 (BP 1 Location: Left arm, BP Patient Position: At rest)   Pulse (!) 54   Temp 97.7 ??F (36.5 ??C)   Resp 14   Ht 5' 9" (1.753 m)   Wt 59 kg (130 lb)     SpO2 100%   BMI 19.20 kg/m??     Left knee: intact skin   Sensory intact to light touch dorsal and plantar foot  Knee flexion and extension intact bilateral  Ankle dorsiflexion and plantarflexion, EHL intact  Dorsalis pedis two plus bilateral    Xrays: bone on bone arthritis left knee medial and patellofemoral compartment    Impression/plan:  Primary osteoarthritis left knee    Symptoms continue with pain at night as well as pain with activities of daily living.  Use of medication and injection with pain.  Plan for left knee replacement.  Risks include but not limited to bleeding, infection, nerve damage, stiffness, DVT, PE, pneumonia, periprosthetic fracture and death    Plan for left knee replacement

## 2018-09-08 NOTE — Progress Notes (Signed)
Received in bed A&O X4. Wife at bedside. Patient complaining of nausea and Zofran administered. Encouraged the use of the incentive spirometer and deep breathing. Dressing to left knee clean dry and intact. Bilateral venodynes and ice packs in use. TED stocking in place. Positive neuro checks. Plan of care discussed. MD's orders reviewed. Instructed to call for assistance. Bed in lowest position. Call bell within reach. Will continue to monitor. Opportunity provided for questions.

## 2018-09-08 NOTE — Anesthesia Procedure Notes (Signed)
Peripheral Block    Start time: 09/08/2018 7:32 AM  End time: 09/08/2018 7:42 AM  Performed by: Anjel Pardo A., MD  Authorized by: Rodolfo Gaster A., MD       Pre-procedure:   Indications: at surgeon's request, post-op pain management and procedure for pain    Preanesthetic Checklist: patient identified, risks and benefits discussed, site marked, timeout performed, anesthesia consent given and patient being monitored  Preanesthetic Checklist comment:  Timeout performed in the presence of both the physician and nurse    Block Type:   Block Type:  Adductor canal  Laterality:  Left  Monitoring:  Standard ASA monitoring, continuous pulse ox, frequent vital sign checks, responsive to questions, oxygen and heart rate  Injection Technique:  Single shot  Procedures: ultrasound guided    Patient Position: supine  Prep: chlorhexidine    Location:  Mid thigh  Needle Type:  Stimuplex  Needle Gauge:  22 G  Needle Localization:  Nerve stimulator, ultrasound guidance, anatomical landmarks and infiltration  Motor Response: minimal motor response >0.4 mA      Assessment:  Number of attempts:  1  Injection Assessment:  Incremental injection every 5 mL, local visualized surrounding nerve on ultrasound, negative aspiration for CSF, negative aspiration for blood, no paresthesia, no intravascular symptoms and ultrasound image on chart  Patient tolerance:  Patient tolerated the procedure well with no immediate complications  Focused neurological exam prior to block performed.

## 2018-09-08 NOTE — Op Note (Signed)
[facility]  [address]  [place], [place] [address]    PATIENT:  Joel Graham, Joel Graham  MRN:  [id number]  DOB:  Jun 26, 1951  ACCT#:  [id number]  ADMIT DATE:  09/08/2018                                                       OPERATIVE REPORT    PROCEDURE DATE:  09/08/2018    SURGEON:  Joycie Peek, MD    ASSISTANT:  Richrd Humbles, PA    PREOPERATIVE DIAGNOSIS:  Primary osteoarthritis of the left knee.    POSTOPERATIVE DIAGNOSIS:  Primary osteoarthritis of the left knee.    PROCEDURE PERFORMED:  Left knee replacement.    IMPLANTS:  1.  Simplex P bone cement with antibiotic x2 bags.  2.  Femoral component was a posterior stabilized cement Triathlon size 6, left.  3.  Tibial implant was a posterior stabilized tibial Triathlon, Universal size 6.  4.  Tibial insert was a tibial posterior stabilized Triathlon X3, size 6 x 9 mm.  5.  Patella was an asymmetric patella by 38 mm X3 Polyethelene, 38 x 11 mm.    ANESTHESIA:  Abductor canal block followed by attempted spinal followed by general  LMA anesthetic.    COMPLICATIONS:  None.    SPECIMENS REMOVED:  Some bone from the left knee to Pathology.    INJECTABLES:  1500 mg of tranexamic acid and 1 g of vancomycin power, 0.5 g on the  tibial plateau, the other half mixed in the tranexamic acid.    INDICATIONS:  The patient is a 68 year old male with progressive pain in the left  knee.  Symptoms have continued despite activity modification, medication, injection  and medicines with continued pain, pain at night and pain with activities of daily  living.  The patient asked for left knee replacement.  Risks to include, but not  limited to bleeding, infection, nerve damage, stiffness, DVT, PE, decubiti,  periprosthetic fracture.  He understands this and opts for surgical intervention.    PROCEDURE:  He was taken to the operating room after induction of adductor canal  block followed by spinal anesthetic.  He still had sensation in the left leg.  LMA   was placed.  Venodyne was on the right leg.  Tourniquet was placed on the left.  Left  leg was prepped and draped for left knee procedure.  Time-out was completed  confirming the left side as appropriate side.  The antibiotics were infused.  Leg was  elevated, exsanguinated with an Esmarch, and the tourniquet was inflated to 275 mmHg.  Incision was made down from the tibial tubercle proximal in line with the patella  three fingerbreadths proximal to superior pole of patella.  Sharp dissection was  carried down to the level of patella and quadriceps tendons.  Medial and lateral  flaps were developed and the retinaculum was marked at the 9 o'clock position on the  patellar retinaculum.  Median parapatellar arthrotomy was carried out and median  subperiosteal dissection was carried out posterior to the mid coronal line on the  proximal tibia.  Fat in the anterior horn of the medial meniscus was removed sharply  with the Bovie.  Fat pad in anterior horn of the lateral meniscus and parapatellar  fat was removed sharply  with the Bovie.  Internal lateral release was carried out at  the patellar retinaculum with the Bovie.  Fat from the anterior phalange of the femur  was removed at the articular surface distally at the knee.  Patella was everted.   Patellar retractor was placed lateral, Z retractor medial.  ACL and PCL remnants were  removed and a pin followed by step-cut reamer was used to access the femoral canal.   Distal femoral cutting block was placed with distal femoral cut of 8 mm and 5 degrees  of valgus.  With this completed, the transepicondylar axis was marked and the tibia  was cut.  Tibia was exposed with the Hohmann placed anterior to the footprint of the  PCL.  Proximal tibia cut was completed, and the tibia sized to a size 6.  Femur then  sized with attention to rotation to a size 6.  Anterior posterior cuts were completed  with PA protecting soft tissue structures.  Soft tissue balancing was carried out   with the lamina spreader Kocher curved osteotome, and this balanced to a size 9  spacer block in flexion and extension with no instability noted.  Chamfer cuts were  then completed followed by the box cut on the femur.  Size 6 femur trial was placed.   Tibial trial size 6 with a 9-mm trial in position knee was floated into extension.   Knee was stable in full extension to varus-valgus positioning and stable at 90  degrees of flexion in the internal-external rotation.  Tibial baseplate was then  pinned with attention to rotation.  Freehand cut was completed on the patella.   Patella sized to a 38.  The patellar button in, no-touch technique allowed for  central tracking of the patella.  Trial gear was then removed with exception of the  tibial baseplate.  Tower reamer and punch were then utilized to repair the tibia.   Bone plug followed by a bone wax was used to occlude the femoral canal access site.   A liter of normal saline followed by a 3-minute bath with Betadine.  A second  3-minute bath with Betadine, total of two was utilized given on the Esmarch, which  was on the back table and not utilized for the patient or near any implants, was  noted to have possibly packing material that was not expected.  It may have come in  with the material or not, so in this scenario, we changed gloves, passed off the  patellar retractor and the Esmarch.  We draped that area on the Mayo stand with all  gloves changed and the Esmarch off.  The two sessions of 3-minute Betadine was used  followed by two sessions of 3-minute peroxide bath was utilized, and a total of 3  liters of normal saline was used to irrigate the wound.  With this completed, the  tibia, femur, and patella were ready for cementing.  A 0.5 g of vancomycin powder was  placed on the tibial baseplate.  Tibia, femur, and the patella were cemented  sequentially with a 9-mm poly in position.  The knee was left in extension until the   cement was cured.  The knee was flexed, and the 9-mm poly was removed with the  tourniquet deflated to evaluate for posterior bleeding, no posterior bleeding was  identified.  The 0 V-Loc was used to close the arthrotomy in running fashion per  protocol.  The tranexamic acid with rest of the 0.5 g of  vancomycin powder was then  injected into the knee.  2-0 Vicryl and 3-0 Vicryl were used to close deep and  superficial subcu.  Staples used to close the skin.  Mepilex dressing was placed.   Venodyne placed on the right leg.  X-ray of the right knee was completed in the  operating room prior to taking the patient to recovery and two views of the left knee  were completed.  There was no periarticular fracture.  No metallic foreign bodies  identified.  The patient was then taken to recovery room in stable condition.                       __________________________________________________                                   Joycie PeekJOHN S Alvina Strother, MD    DD:  09/08/2018 18:59:29/JJ      DT:  09/08/2018 19:06:[id number]  Job #:  [id number] / [id number]

## 2018-09-08 NOTE — Other (Signed)
TRANSFER - IN REPORT:    Verbal report received from Emelda FearGail Babcock RN(name) on [facility] BreedWilliam Tarlton  [facility]      Report consisted of patient???s Situation, Background, Assessment and   Recommendations(SBAR).     Information from the following report(s) SBAR, OR Summary, Intake/Output and MAR was reviewed with the receiving nurse.    Opportunity for questions and clarification was provided.      Assessment completed upon patient???s arrival to unit and care assumed.

## 2018-09-08 NOTE — Other (Signed)
Bedside and Verbal shift change report given to Debby Nieves RN(oncoming nurse) by Genevieve Rebaya, RN (offgoing nurse). Report included the following information SBAR, Intake/Output, MAR and Recent Results.

## 2018-09-08 NOTE — Other (Signed)
Encouraged deep breathing frequently  Resp easy and regular--lungs clear  Color good, skin warm and dry  IV site clean and dry  On cardiac monitor., compression hoses on  Alert/oriented-  Circulatory status good to toes with good bilateral pedal pulses  C/O left knee pain -Rx given till comfortable-failed spinal anesthesia per anesthesia dept

## 2018-09-08 NOTE — Consults (Signed)
[facility]  [facility]  Medical Consultation  ________________________________________________________    Reason for consultation: Medical Management   Attending Provider:  Joycie PeekJuliano, John S., MD  Primary Care provider:  Marylynn PearsonAttardo, Ciro, MD    History of Present Illness:  This is a 67 y.o. year old male with significant medical condition listed below pt with hx of arthritis, failed conservative management, admitted for elective surgery, consulted for medical management,   Pt seen at bedside, denies OSA, dizziness, chest pain, sob.     Past Medical History:   has a past medical history of Arthritis, CAD (coronary artery disease), Chronic kidney disease, Enlarged prostate, and Nausea & vomiting.   Surgical History:  Past Surgical History:   Procedure Laterality Date   ??? HX KNEE ARTHROSCOPY Bilateral     torn meniscus   ??? HX ORTHOPAEDIC Bilateral     Carpal tunnell   ??? HX ORTHOPAEDIC Bilateral     heel spurs   ??? HX OTHER SURGICAL  1990    hemmoroidectomy   ??? HX UROLOGICAL        Social History:  The patient  reports that he quit smoking about 36 years ago. He quit smokeless tobacco use about 43 years ago. He reports current alcohol use. He reports that he does not use drugs.   Family History:  family history is not on file.    Home Medications:  Prior to Admission Medications   Prescriptions Last Dose Informant Patient Reported? Taking?   Cholecalciferol, Vitamin D3, 3,000 unit tab 09/01/2018 at Unknown time  Yes Yes   Sig: Take 1,000 Units by mouth daily.   acetaminophen (TYLENOL) 325 mg tablet 09/07/2018 at 2000  Yes Yes   Sig: Take 650 mg by mouth every four (4) hours as needed for Pain. Indications: pain associated with arthritis   aspirin delayed-release 81 mg tablet 09/01/2018 at Unknown time  Yes Yes   Sig: Take 81 mg by mouth daily.   atorvastatin (LIPITOR) 40 mg tablet 09/07/2018 at 1800  Yes Yes   Sig: Take 40 mg by mouth daily.    dutasteride (AVODART) 0.5 mg capsule 09/07/2018 at 2000  Yes Yes   Sig: Take 0.5 mg by mouth daily.   folic acid/multivit-min/lutein (CENTRUM SILVER PO) 09/01/2018 at Unknown time  Yes Yes   Sig: Take 1 Tab by mouth daily.   ibuprofen (ADVIL) 200 mg tablet 09/01/2018 at Unknown time  Yes Yes   Sig: Take 200 mg by mouth nightly.   levothyroxine (SYNTHROID) 50 mcg tablet 09/08/2018 at 0400  Yes Yes   Sig: Take 50 mcg by mouth Daily (before breakfast).   psyllium (METAMUCIL) packet 09/07/2018 at 1830  Yes Yes   Sig: Take 1 Packet by mouth daily.   rOPINIRole (REQUIP) 1 mg tablet 09/07/2018 at 2000  Yes Yes   Sig: Take 1.5 mg by mouth daily.   tamsulosin (FLOMAX) 0.4 mg capsule 09/07/2018 at 2000  Yes Yes   Sig: Take 0.4 mg by mouth daily.        Facility-Administered Medications: None       Review of Systems:  Constitutional:  No fever, fatigue, or night sweats.   HEENT:  No vision changes or headaches. No hearing loss  Respiratory:   No cough, no audible wheeze, respirations regular.  Cardiovascular:  No palpitations.  Gastrointestinal:  No vomiting, diarrhea or constipation.   Genitourinary:  No dysuria or hematuria.  Metabolic/Endocrine:  No polyuria, polydypsia, or polyphagia.  No cold/ heat intolerance.  Neuro/Psychiatric:  No dizziness, no emotional disturbances.           Physical Examination:       General alert, well appearing, and in no distress    Vital signs  Blood pressure 127/70, pulse 80, temperature 97.8 ??F (36.6 ??C), resp. rate 18, height 5\' 9"  (1.753 m), weight 59 kg (130 lb), SpO2 96 %.    Mental status alert, oriented to person, place, and time    Neck neck:supple, no significant adenopathy    Chest Bilateral air ent, no w/c/r    Heart S1, S2+ve, no m/g/r    Abdomen BS+ve, soft, NT/ND    Neurological alert, oriented, normal speech, no focal findings or movement disorder noted    Extremity No  edema, symmetric distal pulses    Skin No rash, no cyanosis        LABS    Labs: Results:        Chemistry No results for input(s): GLU, NA, K, CL, CO2, BUN, CREA, CA, AGAP, BUCR, TBIL, GPT, AP, TP, ALB, GLOB, AGRAT in the last 72 hours.   CBC w/Diff No results for input(s): WBC, RBC, HGB, HCT, PLT, GRANS, LYMPH, EOS, HGBEXT, HCTEXT, PLTEXT in the last 72 hours.   Cardiac Enzymes No results for input(s): CPK, CKND1, MYO in the last 72 hours.    No lab exists for component: CKRMB, TROIP   Coagulation No results for input(s): PTP, INR, APTT, INREXT in the last 72 hours.    Liver Enzymes No results for input(s): TP, ALB, TBIL, AP, SGOT, GPT in the last 72 hours.    No lab exists for component: DBIL   Urine Analysis Color   Date Value Ref Range Status   11/03/2016 YELLOW YEL   Final     Appearance   Date Value Ref Range Status   11/03/2016 CLEAR CLEAR   Final     pH (UA)   Date Value Ref Range Status   11/03/2016 5.5 4.5 - 8.0   Final     Protein   Date Value Ref Range Status   11/03/2016 NEGATIVE  NEG mg/dL Final     Ketone   Date Value Ref Range Status   11/03/2016 NEGATIVE  NEG mg/dL Final     Bilirubin   Date Value Ref Range Status   11/03/2016 NEGATIVE  NEG   Final     Blood   Date Value Ref Range Status   11/03/2016 SMALL (A) NEG   Final     Urobilinogen   Date Value Ref Range Status   11/03/2016 0.2 0.1 - 1.0 EU/dL Final     Nitrites   Date Value Ref Range Status   11/03/2016 NEGATIVE  NEG   Final     Leukocyte Esterase   Date Value Ref Range Status   11/03/2016 NEGATIVE  NEG   Final      BNP No results for input(s): BNPP in the last 72 hours.           imaging:    Results from Hospital Encounter encounter on 09/08/18   XR KNEE LT MAX 2 VWS    Narrative XR KNEE LT MAX 2 VWS    CLINICAL INDICATION PROVIDED.: "surgery."    TECHNIQUE.: 2 views, left knee.    COMPARISON.:  MRI dated 04/19/2010.    FINDINGS.:   Postoperative changes of left total knee arthroplasty are noted. The hardware  appears intact and demonstrates normal alignment.  IMPRESSION.:   Postoperative changes of left total knee arthroplasty.        Assessment/Recommendation:     Ehtan Delfavero is a 67 yr old M, medical condition Hypothyroidism, HLD, BPH,     Active Problems:    Primary osteoarthritis of left knee (09/08/2018)         #S/p Left knee total arthroplasty  -pain mgt  -PT/OT  -GI/DVT prophylaxis as per Ortho  -Inc spirometer  -monitor labs post op    #HLD  -c/w statin    #Constipation,chronic  -c/w colace, miralax added    #Restless leg syndrome  -c/w requip    #BPH  -c/w flomax    #Hypothyroidism  -c/w synthroid          Disposition:as per Ortho      Thank you for the opportunity to participate in the care of Malachi Pro, NP  September 08, 2018  Hospitalist, [facility]  [address]  [place], [place] [address]  Phone: [phone]

## 2018-09-08 NOTE — Anesthesia Post-Procedure Evaluation (Signed)
Procedure(s):  LEFT TOTAL KNEE ARTHROPLASTY.    general    Anesthesia Post Evaluation      Multimodal analgesia: multimodal analgesia used between 6 hours prior to anesthesia start to PACU discharge  Patient location during evaluation: PACU  Patient participation: complete - patient participated  Level of consciousness: awake and alert  Pain management: satisfactory to patient  Airway patency: patent  Anesthetic complications: no  Cardiovascular status: acceptable, blood pressure returned to baseline, hemodynamically stable and stable  Respiratory status: acceptable  Hydration status: acceptable  Post anesthesia nausea and vomiting:  controlled      Vitals Value Taken Time   BP 126/63 09/08/2018 12:00 PM   Temp 37.1 ??C (98.8 ??F) 09/08/2018 11:50 AM   Pulse 70 09/08/2018 12:00 PM   Resp 20 09/08/2018 12:00 PM   SpO2 94 % 09/08/2018 12:01 PM   Vitals shown include unvalidated device data.

## 2018-09-08 NOTE — Anesthesia Pre-Procedure Evaluation (Addendum)
Relevant Problems   No relevant active problems       Anesthetic History     PONV          Review of Systems / Medical History  Patient summary reviewed, nursing notes reviewed and pertinent labs reviewed    Pulmonary  Within defined limits                 Neuro/Psych   Within defined limits           Cardiovascular              CAD    Exercise tolerance: >4 METS     GI/Hepatic/Renal         Renal disease: stones       Endo/Other        Arthritis     Other Findings              Physical Exam    Airway  Mallampati: II  TM Distance: 4 - 6 cm  Neck ROM: normal range of motion   Mouth opening: Normal     Cardiovascular  Regular rate and rhythm,  S1 and S2 normal,  no murmur, click, rub, or gallop  Rhythm: regular  Rate: normal         Dental  No notable dental hx       Pulmonary  Breath sounds clear to auscultation               Abdominal  GI exam deferred       Other Findings            Anesthetic Plan    ASA: 3  Anesthesia type: general      Post-op pain plan if not by surgeon: peripheral nerve block single    Induction: Intravenous  Anesthetic plan and risks discussed with: Patient

## 2018-09-08 NOTE — Progress Notes (Signed)
Problem: Mobility Impaired (Adult and Pediatric)  Goal: *Therapy Goal (Edit Goal, Insert Text)  Description  Physical Therapy Goals 4 visits.  Initiated 09/08/2018  1.  Patient will move from supine to sit and sit to supine , scoot up and down and roll side to side in bed with independence within 4 visits.    2.  Patient will transfer from bed to chair and chair to bed with modified independence using RW within 4 visits.  3.  Patient will perform sit to stand with modified independence within 4 visits.  4.  Patient will ambulate with modified independence for 250 feet with RW within 4 visits.   5.  Patient will ascend/descend 10 stairs with B handrail(s) with modified independence within 4 visits.   Note:   PHYSICAL THERAPY EVALUATION    Patient: Joel Graham (09(67 y.o. male)  Date: 09/08/2018  Primary Diagnosis: Osteoarthritis of left knee, unspecified osteoarthritis type [M17.12]  Primary osteoarthritis of left knee [M17.12]  Procedure(s) (LRB):  LEFT TOTAL KNEE ARTHROPLASTY (Left) Day of Surgery   Precautions: WBAT L LE, L TKR      ASSESSMENT :  Based on the objective data described below, the patient presents with appropriate functional mobility s/p L TKR. Pt was educated in L TKR precautions and voiced understanding. Pt completed all functional mobility with SUP and amb 125' with RW and SUP. Pt was instructed in LE bed exercises and performed them x 10 ea. For more details see below.    Patient will benefit from skilled intervention to address the above impairments.  Patient???s rehabilitation potential is considered to be Good  Factors which may influence rehabilitation potential include:   [x]          None noted  []          Mental ability/status  []          Medical condition  []          Home/family situation and support systems  []          Safety awareness  []          Pain tolerance/management  []          Other:      PLAN :  Recommendations and Planned Interventions:   [x]            Bed Mobility Training             [x]     Neuromuscular Re-Education  [x]            Transfer Training                   []     Orthotic/Prosthetic Training  [x]            Gait Training                         []     Modalities  [x]            Therapeutic Exercises           [x]     Edema Management/Control  [x]            Therapeutic Activities            [x]     Patient and Family Training/Education  []            Other (comment):    Frequency/Duration: Patient will be followed by physical therapy twice daily to address goals.  Discharge Recommendations: Home Health  Further  Equipment Recommendations for Discharge: Pt has rolling walker     SUBJECTIVE:   Patient stated ???I have a little discomfort but it isn't bad.???    OBJECTIVE DATA SUMMARY:     Past Medical History:   Diagnosis Date    Arthritis     CAD (coronary artery disease)     cholesterol    Chronic kidney disease     stones    Enlarged prostate     Nausea & vomiting      Past Surgical History:   Procedure Laterality Date    HX KNEE ARTHROSCOPY Bilateral     torn meniscus    HX ORTHOPAEDIC Bilateral     Carpal tunnell    HX ORTHOPAEDIC Bilateral     heel spurs    HX OTHER SURGICAL  1990    hemmoroidectomy    HX UROLOGICAL       Prior Level of Function/Home Situation: Independent  Home Situation  Home Environment: Private residence  # Steps to Enter: 2  Rails to Enter: No  One/Two Story Residence: One story  Living Alone: No  Support Systems: Games developer  Patient Expects to be Discharged to:: Private residence  Current DME Used/Available at Home: Environmental consultant, rolling  Critical Behavior:  Neurologic State: Alert  Orientation Level: Oriented X4  Cognition: Appropriate decision making;Appropriate for age attention/concentration;Appropriate safety awareness;Follows commands  Safety/Judgement: Awareness of environment  Skin:    Strength:    Strength: Within functional limits  Tone & Sensation:   Tone: Normal  Sensation: Intact   Range Of Motion:  AROM: Within functional limits  PROM: Within functional limits  Functional Mobility:  Bed Mobility:  Rolling: Supervision  Supine to Sit: Supervision  Transfers:  Sit to Stand: Supervision  Stand to Sit: Supervision  Balance:   Sitting: Intact  Standing: Intact;With support  Ambulation/Gait Training:  Distance (ft): 125 Feet (ft)  Assistive Device: Walker, rolling  Ambulation - Level of Assistance: Supervision  Gait Description (WDL): Exceptions to WDL  Gait Abnormalities: Decreased step clearance  Left Side Weight Bearing: As tolerated  Base of Support: Narrowed  Speed/Cadence: Slow  Step Length: Left shortened;Right shortened    Therapeutic Exercises:   Quad sets, hip abd, ankle pumps, heel slides x 10 ea  Pain:  Pain Scale 1: Numeric (0 - 10)  Pain Intensity 1: 5  Pain Location 1: Knee  Pain Orientation 1: Left  Pain Description 1: Aching;Sore(rest and repostioned)  Pain Intervention(s) 1: Medication (see MAR);Cold pack  Activity Tolerance:   Good  Please refer to the flowsheet for vital signs taken during this treatment.  After treatment:            Patient left in no apparent distress sitting up in chair           Patient left in no apparent distress in bed           Call bell left within reach           Nursing notified           Caregiver present           Bed alarm activated    COMMUNICATION/EDUCATION:   The patient???s plan of care was discussed with: Registered Nurse.           Fall prevention education was provided and the patient/caregiver indicated understanding.           Patient/family have participated as able in goal setting and plan of care.    Patient/family agree to work toward stated goals and plan of care.  []          Patient understands intent and goals of therapy, but is neutral about his/her participation.  []          Patient is unable to participate in goal setting and plan of care.    Thank you for this referral.     Time Calculation: 30 mins

## 2018-09-08 NOTE — Anesthesia Procedure Notes (Addendum)
Spinal Block    Start time: 09/08/2018 7:44 AM  End time: 09/08/2018 7:50 AM  Performed by: Lizzie Cokley A., MD  Authorized by: Rudi Bunyard A., MD     Pre-procedure:  Indications: at surgeon's request and post-op pain management  Preanesthetic Checklist: patient identified, risks and benefits discussed, anesthesia consent, site marked, patient being monitored and timeout performed    Timeout Time: 07:43          Spinal Block:   Patient Position:  Seated  Prep Region:  Lumbar  Prep: Betadine      Location:  L3-4  Technique:  Single shot    Local Dose (mL):  3    Needle:   Needle Type:  Pencan  Needle Gauge:  25 G  Attempts:  1      Events: CSF confirmed, no blood with aspiration and no paresthesia        Assessment:  Insertion:  Uncomplicated  Patient tolerance:  Patient tolerated the procedure well with no immediate complications

## 2018-09-08 NOTE — Anesthesia Procedure Notes (Signed)
Spinal Block    Start time: 09/08/2018 7:44 AM  End time: 09/08/2018 7:50 AM  Performed by: Rose PhiKuklov, Aleksey A., MD  Authorized by: Rose PhiKuklov, Aleksey A., MD     Pre-procedure:  Indications: at surgeon's request and post-op pain management  Preanesthetic Checklist: patient identified, risks and benefits discussed, anesthesia consent, site marked, patient being monitored and timeout performed    Timeout Time: 07:43          Spinal Block:   Patient Position:  Seated  Prep Region:  Lumbar  Prep: Betadine      Location:  L3-4  Technique:  Single shot    Local Dose (mL):  3    Needle:   Needle Type:  Pencan  Needle Gauge:  25 G  Attempts:  1      Events: CSF confirmed, no blood with aspiration and no paresthesia        Assessment:  Insertion:  Uncomplicated  Patient tolerance:  Patient tolerated the procedure well with no immediate complications

## 2018-09-08 NOTE — Progress Notes (Signed)
Received patient from PACU alert and oriented x4, dressing to left knee clean, dry and intact, TEDs, venodynes and ice pack in use, good pedal pulse and cap refill. Patient states pain is down to 4. IVF infusing. IS at 2500. Call bell within reach.    1500 Patient OOB to chair and bathroom. Ambulated 125 ft with PT. Seen by hospitalist. Tolerating diet. Still due to void.    1600 Patient attempted to void in the bathroom but unsuccessful.  1645 Suprapubic distention and discomfort noted. Straight cath done, 800 cc of yellow urine drained.    1700 Seen by OT

## 2018-09-08 NOTE — Op Note (Deleted)
BRIEF OPERATIVE NOTE    Date of Procedure: 09/08/2018   Preoperative Diagnosis: Osteoarthritis of left knee, unspecified osteoarthritis type [M17.12]  Postoperative Diagnosis: Primary osteoarthritis left knee    Procedure(s):  LEFT TOTAL KNEE ARTHROPLASTY  Surgeon(s) and Role:     * Joycie PeekJuliano, John S., MD - Primary         Surgical Assistant: Levonne LappingJennifer Keilyn Nadal PA-C     Surgical Staff:  Circ-1: Maye HidesSpychalski, Eun, RN  Physician Assistant: Richrd HumblesYuengst, Trenell Concannon A, PA  Radiology Technician: Marcelle Smilingurieux, Laura, RT, R  Scrub Tech-1: Joselyn GlassmanFields, Jason L; Paliana, Myriam JacobsonHelen  Event Time In Time Out   Incision Start 0820    Incision Close 1026      Anesthesia: Spinal   Estimated Blood Loss: 100cc  Specimens:   ID Type Source Tests Collected by Time Destination   A : BONE LEFT KNEE Preservative Knee, left  Joycie PeekJuliano, John S., MD 09/08/2018 [phone] Pathology      Findings: see dictation   Complications: none  Implants:   Implant Name Type Inv. Item Serial No. Manufacturer Lot No. LRB No. Used Action   CEMENT BONE SPD SIMPLEX 40 GR --  - SNA  CEMENT BONE SPD SIMPLEX 40 GR --  NA [employer] HOWM [id number] Left 2 Implanted   COMPNT FEM PS CEM TRIATHLN 6 L --  - SNA  COMPNT FEM PS CEM TRIATHLN 6 L --  NA [employer] HOWM [id number] Left 1 Implanted   INSERT TIB PS TRIATHLN X3 6 9 --  - SNA  INSERT TIB PS TRIATHLN X3 6 9 --  NA [employer] HOWM [id number] Left 1 Implanted   BASEPLT TIB REV UNIV SZ 6 R/L -- TRIATHLON - SNA  BASEPLT TIB REV UNIV SZ 6 R/L -- TRIATHLON NA [employer] HOWM [id number] Left 1 Implanted   PAT ASYM TRIATHLN X3 [id number] -- TRIATHLON ASYMMETRIC X3 - SNA  PAT ASYM TRIATHLN X3 [id number] -- TRIATHLON ASYMMETRIC X3 NA [employer] HOWM [id number] Left 1 Implanted

## 2018-09-08 NOTE — Progress Notes (Signed)
 Problem: Self Care Deficits Care Plan (Adult)  Goal: *Acute Goals and Plan of Care (Insert Text)  Description  1) Issue adaptive equipment as needed then educate patient/family regarding the use of A/E or compensatory strategies to increase self care ability s/p TKR.  2) Train resident regarding precautions S/P TKR  3) increase LB dressing from S to Mod I utilizing A/E as issued by OT.      Note:   OCCUPATIONAL THERAPY EVALUATION    Patient: Joel Graham (67 y.o. male)  Date: 09/08/2018  Primary Diagnosis: Osteoarthritis of left knee, unspecified osteoarthritis type [M17.12]  Primary osteoarthritis of left knee [M17.12]  Procedure(s) (LRB):  LEFT TOTAL KNEE ARTHROPLASTY (Left) Day of Surgery   Precautions:  WBAT    ASSESSMENT :  Based on the objective data described below, the patient presents with decreased ROM, strength and pain in L knee resulting in impaired self care ability.  Pt. Demonstrated bed mobility, bathroom mobility, toileting, transfers and ambulated 100'.    Patient will benefit from skilled intervention to address the above impairments.  Patient's rehabilitation potential is considered to be Excellent  Factors which may influence rehabilitation potential include:   [x]              None noted  []              Mental ability/status  []              Medical condition  []              Home/family situation and support systems  []              Safety awareness  []              Pain tolerance/management  []              Other:      PLAN :  Recommendations and Planned Interventions:  [x]                Self Care Training                  [x]         Therapeutic Activities  []                Functional Mobility Training    []         Cognitive Retraining  [x]                Therapeutic Exercises           []         Endurance Activities  []                Balance Training                   []         Neuromuscular Re-Education  []                Visual/Perceptual Training     [x]    Home Safety Training  [x]                 Patient Education                 []         Family Training/Education  []                Other (comment):    Frequency/Duration: Patient will be followed by occupational therapy 2 times a week to address goals.  Discharge  Recommendations: Home Health  Further Equipment Recommendations for Discharge: TBD     SUBJECTIVE:   Patient stated "I feel pretty good right now."    OBJECTIVE DATA SUMMARY:     Past Medical History:   Diagnosis Date    Arthritis     CAD (coronary artery disease)     cholesterol    Chronic kidney disease     stones    Enlarged prostate     Nausea & vomiting      Past Surgical History:   Procedure Laterality Date    HX KNEE ARTHROSCOPY Bilateral     torn meniscus    HX ORTHOPAEDIC Bilateral     Carpal tunnell    HX ORTHOPAEDIC Bilateral     heel spurs    HX OTHER SURGICAL  1990    hemmoroidectomy    HX UROLOGICAL       Prior Level of Function/Home Situation: Modified I in all self care   Home Situation  Home Environment: Private residence  # Steps to Enter: 2  Rails to Enter: No  One/Two Story Residence: One story  Living Alone: No  Support Systems: Games developer  Patient Expects to be Discharged to:: Private residence  Current DME Used/Available at Home: Environmental consultant, rolling  Tub or Shower Type: Tub/Shower combination  [x]   Right hand dominant   []   Left hand dominant  Cognitive/Behavioral Status:  Neurologic State: Alert;Appropriate for age  Orientation Level: Appropriate for age;Oriented X4  Cognition: Appropriate decision making;Appropriate for age attention/concentration;Appropriate safety awareness;Follows commands  Safety/Judgement: Awareness of environment;Insight into deficits                  Coordination:  Coordination: Within functional limits  Fine Motor Skills-Upper: Left Intact;Right Intact    Gross Motor Skills-Upper: Left Intact;Right Intact  Balance:  Sitting: Intact  Standing: Intact;With support  Strength:  BUE: 4/5  Strength: Within functional limits               Tone & Sensation:    Tone: Normal  Sensation: Intact                    Range of Motion:  BUE  AROM: Within functional limits  PROM: Within functional limits                    Functional Mobility and Transfers for ADLs:  Bed Mobility:  Rolling: Supervision  Supine to Sit: Supervision  Sit to Supine: Supervision     Transfers:  Sit to Stand: Supervision                Toilet Transfer : Supervision   Tub Transfer: Contact guard assistance       Bathroom Mobility: Supervision/set up  ADL Assessment:  Feeding: Independent                                       ADL Intervention:  Feeding  Feeding Assistance: Independent  Container Management: Independent  Cutting Food: Independent  Utensil Management: Independent  Food to Mouth: Independent  Drink to Mouth: Arts administrator Assistance: Supervision  Bladder Hygiene: Supervision  Bowel Hygiene: Supervision  Clothing Management: Supervision    Cognitive Retraining  Safety/Judgement: Awareness of environment;Insight into deficits    ADL Training:  Issued adaptive equipment to patient and then instructed patient on how to correctly use the equipment to increase LB dressing and hygiene ability.   Toileting with supervision and R/W for mobility, clothing mgt transfers and hygiene    Pain:  Pain Scale 1: Numeric (0 - 10)  Pain Intensity 1: 5  Pain Location 1: Knee  Pain Orientation 1: Left  Pain Description 1: Aching  Pain Intervention(s) 1: Medication (see MAR);Position;Ice  Activity Tolerance:   Fair +    Please refer to the flowsheet for vital signs taken during this treatment.  After treatment:   []  Patient left in no apparent distress sitting up in chair  [x]  Patient left in no apparent distress in bed  [x]  Call bell left within reach  [x]  Nursing notified  []  Caregiver present  []  Bed alarm activated    COMMUNICATION/EDUCATION:   The patient's plan of care was discussed with: patient.  [x]  Home safety education was provided and  the patient/caregiver indicated understanding.  [x]  Patient/family have participated as able in goal setting and plan of care.  [x]  Patient/family agree to work toward stated goals and plan of care.  []  Patient understands intent and goals of therapy, but is neutral about his/her participation.  []  Patient is unable to participate in goal setting and plan of care.  This patient's plan of care is appropriate for delegation to OTA.    Thank you for this referral.  Frank Zottola  Time Calculation: 45 mins

## 2018-09-08 NOTE — H&P (Signed)
Left knee    Progressive pain in the left knee with symptoms continue despite activity modification and medication including injections.  Pain with activities of daily living and pain at night.  Plan for left knee replacement.      Past Medical History:   Diagnosis Date   ??? Arthritis    ??? CAD (coronary artery disease)     cholesterol   ??? Chronic kidney disease     stones   ??? Enlarged prostate    ??? Nausea & vomiting      Past Surgical History:   Procedure Laterality Date   ??? HX KNEE ARTHROSCOPY Bilateral     torn meniscus   ??? HX ORTHOPAEDIC Bilateral     Carpal tunnell   ??? HX ORTHOPAEDIC Bilateral     heel spurs   ??? HX OTHER SURGICAL  1990    hemmoroidectomy   ??? HX UROLOGICAL       Social History     Socioeconomic History   ??? Marital status: MARRIED     Spouse name: Not on file   ??? Number of children: Not on file   ??? Years of education: Not on file   ??? Highest education level: Not on file   Occupational History   ??? Not on file   Social Needs   ??? Financial resource strain: Not on file   ??? Food insecurity:     Worry: Not on file     Inability: Not on file   ??? Transportation needs:     Medical: Not on file     Non-medical: Not on file   Tobacco Use   ??? Smoking status: Former Smoker     Last attempt to quit: 11/03/1981     Years since quitting: 36.8   ??? Smokeless tobacco: Former NeurosurgeonUser     Quit date: 08/02/1975   Substance and Sexual Activity   ??? Alcohol use: Yes     Comment: occ   ??? Drug use: No   ??? Sexual activity: Not on file   Lifestyle   ??? Physical activity:     Days per week: Not on file     Minutes per session: Not on file   ??? Stress: Not on file   Relationships   ??? Social connections:     Talks on phone: Not on file     Gets together: Not on file     Attends religious service: Not on file     Active member of club or organization: Not on file     Attends meetings of clubs or organizations: Not on file     Relationship status: Not on file   ??? Intimate partner violence:     Fear of current or ex partner: Not on file      Emotionally abused: Not on file     Physically abused: Not on file     Forced sexual activity: Not on file   Other Topics Concern   ??? Not on file   Social History Narrative   ??? Not on file     Review of systems: negative for fevers, chills, night sweats, nausea, vomiting, chest pain, shortness of breath, cough    Visit Vitals  BP 130/69 (BP 1 Location: Left arm, BP Patient Position: At rest)   Pulse (!) 54   Temp 97.7 ??F (36.5 ??C)   Resp 14   Ht 5\' 9"  (1.753 m)   Wt 59 kg (130 lb)  SpO2 100%   BMI 19.20 kg/m??     Left knee: intact skin   Sensory intact to light touch dorsal and plantar foot  Knee flexion and extension intact bilateral  Ankle dorsiflexion and plantarflexion, EHL intact  Dorsalis pedis two plus bilateral    Xrays: bone on bone arthritis left knee medial and patellofemoral compartment    Impression/plan:  Primary osteoarthritis left knee    Symptoms continue with pain at night as well as pain with activities of daily living.  Use of medication and injection with pain.  Plan for left knee replacement.  Risks include but not limited to bleeding, infection, nerve damage, stiffness, DVT, PE, pneumonia, periprosthetic fracture and death    Plan for left knee replacement

## 2018-09-08 NOTE — Interval H&P Note (Signed)
Encouraged deep breathing frequently  Resp easy and regular--lungs clear  Color good, skin warm and dry  IV site clean and dry  On cardiac monitor., compression hoses on  Alert/oriented-  Circulatory status good to toes with good bilateral pedal pulses  C/O left knee pain -Rx given till comfortable-failed spinal anesthesia per anesthesia dept

## 2018-09-08 NOTE — Consults (Signed)
[facility]useum/[facility]  [facility])  Medical Consultation  ________________________________________________________    Reason for consultation: Medical Management   Attending Provider:  Joycie Peek., MD  Primary Care provider:  Marylynn Pearson, MD    History of Present Illness:  This is a 67 y.o. year old male with significant medical condition listed below pt with hx of arthritis, failed conservative management, admitted for elective surgery, consulted for medical management,   Pt seen at bedside, denies OSA, dizziness, chest pain, sob.     Past Medical History:   has a past medical history of Arthritis, CAD (coronary artery disease), Chronic kidney disease, Enlarged prostate, and Nausea & vomiting.   Surgical History:  Past Surgical History:   Procedure Laterality Date   ??? HX KNEE ARTHROSCOPY Bilateral     torn meniscus   ??? HX ORTHOPAEDIC Bilateral     Carpal tunnell   ??? HX ORTHOPAEDIC Bilateral     heel spurs   ??? HX OTHER SURGICAL  1990    hemmoroidectomy   ??? HX UROLOGICAL        Social History:  The patient  reports that he quit smoking about 36 years ago. He quit smokeless tobacco use about 43 years ago. He reports current alcohol use. He reports that he does not use drugs.   Family History:  family history is not on file.    Home Medications:  Prior to Admission Medications   Prescriptions Last Dose Informant Patient Reported? Taking?   Cholecalciferol, Vitamin D3, 3,000 unit tab 09/01/2018 at Unknown time  Yes Yes   Sig: Take 1,000 Units by mouth daily.   acetaminophen (TYLENOL) 325 mg tablet 09/07/2018 at 2000  Yes Yes   Sig: Take 650 mg by mouth every four (4) hours as needed for Pain. Indications: pain associated with arthritis   aspirin delayed-release 81 mg tablet 09/01/2018 at Unknown time  Yes Yes   Sig: Take 81 mg by mouth daily.   atorvastatin (LIPITOR) 40 mg tablet 09/07/2018 at 1800  Yes Yes   Sig: Take 40 mg by mouth daily.   dutasteride (AVODART) 0.5 mg capsule  09/07/2018 at 2000  Yes Yes   Sig: Take 0.5 mg by mouth daily.   folic acid/multivit-min/lutein (CENTRUM SILVER PO) 09/01/2018 at Unknown time  Yes Yes   Sig: Take 1 Tab by mouth daily.   ibuprofen (ADVIL) 200 mg tablet 09/01/2018 at Unknown time  Yes Yes   Sig: Take 200 mg by mouth nightly.   levothyroxine (SYNTHROID) 50 mcg tablet 09/08/2018 at 0400  Yes Yes   Sig: Take 50 mcg by mouth Daily (before breakfast).   psyllium (METAMUCIL) packet 09/07/2018 at 1830  Yes Yes   Sig: Take 1 Packet by mouth daily.   rOPINIRole (REQUIP) 1 mg tablet 09/07/2018 at 2000  Yes Yes   Sig: Take 1.5 mg by mouth daily.   tamsulosin (FLOMAX) 0.4 mg capsule 09/07/2018 at 2000  Yes Yes   Sig: Take 0.4 mg by mouth daily.        Facility-Administered Medications: None       Review of Systems:  Constitutional:  No fever, fatigue, or night sweats.   HEENT:  No vision changes or headaches. No hearing loss  Respiratory:   No cough, no audible wheeze, respirations regular.  Cardiovascular:  No palpitations.  Gastrointestinal:  No vomiting, diarrhea or constipation.   Genitourinary:  No dysuria or hematuria.  Metabolic/Endocrine:  No polyuria, polydypsia, or polyphagia.  No cold/ heat intolerance.  Neuro/Psychiatric:  No dizziness, no emotional disturbances.           Physical Examination:       General alert, well appearing, and in no distress    Vital signs  Blood pressure 127/70, pulse 80, temperature 97.8 ??F (36.6 ??C), resp. rate 18, height 5\' 9"  (1.753 m), weight 59 kg (130 lb), SpO2 96 %.    Mental status alert, oriented to person, place, and time    Neck neck:supple, no significant adenopathy    Chest Bilateral air ent, no w/c/r    Heart S1, S2+ve, no m/g/r    Abdomen BS+ve, soft, NT/ND    Neurological alert, oriented, normal speech, no focal findings or movement disorder noted    Extremity No  edema, symmetric distal pulses    Skin No rash, no cyanosis        LABS    Labs: Results:       Chemistry No results for input(s): GLU, NA, K, CL,  CO2, BUN, CREA, CA, AGAP, BUCR, TBIL, GPT, AP, TP, ALB, GLOB, AGRAT in the last 72 hours.   CBC w/Diff No results for input(s): WBC, RBC, HGB, HCT, PLT, GRANS, LYMPH, EOS, HGBEXT, HCTEXT, PLTEXT in the last 72 hours.   Cardiac Enzymes No results for input(s): CPK, CKND1, MYO in the last 72 hours.    No lab exists for component: CKRMB, TROIP   Coagulation No results for input(s): PTP, INR, APTT, INREXT in the last 72 hours.    Liver Enzymes No results for input(s): TP, ALB, TBIL, AP, SGOT, GPT in the last 72 hours.    No lab exists for component: DBIL   Urine Analysis Color   Date Value Ref Range Status   11/03/2016 YELLOW YEL   Final     Appearance   Date Value Ref Range Status   11/03/2016 CLEAR CLEAR   Final     pH (UA)   Date Value Ref Range Status   11/03/2016 5.5 4.5 - 8.0   Final     Protein   Date Value Ref Range Status   11/03/2016 NEGATIVE  NEG mg/dL Final     Ketone   Date Value Ref Range Status   11/03/2016 NEGATIVE  NEG mg/dL Final     Bilirubin   Date Value Ref Range Status   11/03/2016 NEGATIVE  NEG   Final     Blood   Date Value Ref Range Status   11/03/2016 SMALL (A) NEG   Final     Urobilinogen   Date Value Ref Range Status   11/03/2016 0.2 0.1 - 1.0 EU/dL Final     Nitrites   Date Value Ref Range Status   11/03/2016 NEGATIVE  NEG   Final     Leukocyte Esterase   Date Value Ref Range Status   11/03/2016 NEGATIVE  NEG   Final      BNP No results for input(s): BNPP in the last 72 hours.           imaging:    Results from Hospital Encounter encounter on 09/08/18   XR KNEE LT MAX 2 VWS    Narrative XR KNEE LT MAX 2 VWS    CLINICAL INDICATION PROVIDED.: "surgery."    TECHNIQUE.: 2 views, left knee.    COMPARISON.:  MRI dated 04/19/2010.    FINDINGS.:   Postoperative changes of left total knee arthroplasty are noted. The hardware  appears intact and demonstrates normal alignment.  IMPRESSION.:   Postoperative changes of left total knee arthroplasty.       Assessment/Recommendation:     Joel Graham is  a 67 yr old M, medical condition Hypothyroidism, HLD, BPH,     Active Problems:    Primary osteoarthritis of left knee (09/08/2018)         #S/p Left knee total arthroplasty  -pain mgt  -PT/OT  -GI/DVT prophylaxis as per Ortho  -Inc spirometer  -monitor labs post op    #HLD  -c/w statin    #Constipation,chronic  -c/w colace, miralax added    #Restless leg syndrome  -c/w requip    #BPH  -c/w flomax    #Hypothyroidism  -c/w synthroid          Disposition:as per Ortho      Thank you for the opportunity to participate in the care of Joel Graham     Joel Pipe, NP  September 08, 2018  Hospitalist, [facility]  [address]  [place], [place] [id number]  Phone: [phone]

## 2018-09-08 NOTE — Anesthesia Post-Procedure Evaluation (Signed)
Procedure(s):  LEFT TOTAL KNEE ARTHROPLASTY.    general    Anesthesia Post Evaluation      Multimodal analgesia: multimodal analgesia used between 6 hours prior to anesthesia start to PACU discharge  Patient location during evaluation: PACU  Patient participation: complete - patient participated  Level of consciousness: awake and alert  Pain management: satisfactory to patient  Airway patency: patent  Anesthetic complications: no  Cardiovascular status: acceptable, blood pressure returned to baseline, hemodynamically stable and stable  Respiratory status: acceptable  Hydration status: acceptable  Post anesthesia nausea and vomiting:  controlled      Vitals Value Taken Time   BP 126/63 09/08/2018 12:00 PM   Temp 37.1 ??C (98.8 ??F) 09/08/2018 11:50 AM   Pulse 70 09/08/2018 12:00 PM   Resp 20 09/08/2018 12:00 PM   SpO2 94 % 09/08/2018 12:01 PM   Vitals shown include unvalidated device data.

## 2018-09-08 NOTE — Anesthesia Procedure Notes (Signed)
Peripheral Block    Start time: 09/08/2018 7:32 AM  End time: 09/08/2018 7:42 AM  Performed by: Rose PhiKuklov, Aleksey A., MD  Authorized by: Rose PhiKuklov, Aleksey A., MD       Pre-procedure:   Indications: at surgeon's request, post-op pain management and procedure for pain    Preanesthetic Checklist: patient identified, risks and benefits discussed, site marked, timeout performed, anesthesia consent given and patient being monitored  Preanesthetic Checklist comment:  Timeout performed in the presence of both the physician and nurse    Block Type:   Block Type:  Adductor canal  Laterality:  Left  Monitoring:  Standard ASA monitoring, continuous pulse ox, frequent vital sign checks, responsive to questions, oxygen and heart rate  Injection Technique:  Single shot  Procedures: ultrasound guided    Patient Position: supine  Prep: chlorhexidine    Location:  Mid thigh  Needle Type:  Stimuplex  Needle Gauge:  22 G  Needle Localization:  Nerve stimulator, ultrasound guidance, anatomical landmarks and infiltration  Motor Response: minimal motor response >0.4 mA      Assessment:  Number of attempts:  1  Injection Assessment:  Incremental injection every 5 mL, local visualized surrounding nerve on ultrasound, negative aspiration for CSF, negative aspiration for blood, no paresthesia, no intravascular symptoms and ultrasound image on chart  Patient tolerance:  Patient tolerated the procedure well with no immediate complications  Focused neurological exam prior to block performed.

## 2018-09-08 NOTE — Progress Notes (Signed)
 Problem: Mobility Impaired (Adult and Pediatric)  Goal: *Therapy Goal (Edit Goal, Insert Text)  Description  Physical Therapy Goals 4 visits.  Initiated 09/08/2018  1.  Patient will move from supine to sit and sit to supine , scoot up and down and roll side to side in bed with independence within 4 visits.    2.  Patient will transfer from bed to chair and chair to bed with modified independence using RW within 4 visits.  3.  Patient will perform sit to stand with modified independence within 4 visits.  4.  Patient will ambulate with modified independence for 250 feet with RW within 4 visits.   5.  Patient will ascend/descend 10 stairs with B handrail(s) with modified independence within 4 visits.   Note:   PHYSICAL THERAPY EVALUATION    Patient: Joel Graham (67 y.o. male)  Date: 09/08/2018  Primary Diagnosis: Osteoarthritis of left knee, unspecified osteoarthritis type [M17.12]  Primary osteoarthritis of left knee [M17.12]  Procedure(s) (LRB):  LEFT TOTAL KNEE ARTHROPLASTY (Left) Day of Surgery   Precautions: WBAT L LE, L TKR      ASSESSMENT :  Based on the objective data described below, the patient presents with appropriate functional mobility s/p L TKR. Pt was educated in L TKR precautions and voiced understanding. Pt completed all functional mobility with SUP and amb 125' with RW and SUP. Pt was instructed in LE bed exercises and performed them x 10 ea. For more details see below.    Patient will benefit from skilled intervention to address the above impairments.  Patient's rehabilitation potential is considered to be Good  Factors which may influence rehabilitation potential include:   [x]          None noted  []          Mental ability/status  []          Medical condition  []          Home/family situation and support systems  []          Safety awareness  []          Pain tolerance/management  []          Other:      PLAN :  Recommendations and Planned Interventions:  [x]            Bed Mobility Training              [x]     Neuromuscular Re-Education  [x]            Transfer Training                   []     Orthotic/Prosthetic Training  [x]            Gait Training                         []     Modalities  [x]            Therapeutic Exercises           [x]     Edema Management/Control  [x]            Therapeutic Activities            [x]     Patient and Family Training/Education  []            Other (comment):    Frequency/Duration: Patient will be followed by physical therapy twice daily to address goals.  Discharge Recommendations: Home Health  Further  Equipment Recommendations for Discharge: Pt has rolling walker     SUBJECTIVE:   Patient stated "I have a little discomfort but it isn't bad."    OBJECTIVE DATA SUMMARY:     Past Medical History:   Diagnosis Date    Arthritis     CAD (coronary artery disease)     cholesterol    Chronic kidney disease     stones    Enlarged prostate     Nausea & vomiting      Past Surgical History:   Procedure Laterality Date    HX KNEE ARTHROSCOPY Bilateral     torn meniscus    HX ORTHOPAEDIC Bilateral     Carpal tunnell    HX ORTHOPAEDIC Bilateral     heel spurs    HX OTHER SURGICAL  1990    hemmoroidectomy    HX UROLOGICAL       Prior Level of Function/Home Situation: Independent  Home Situation  Home Environment: Private residence  # Steps to Enter: 2  Rails to Enter: No  One/Two Story Residence: One story  Living Alone: No  Support Systems: Games developer  Patient Expects to be Discharged to:: Private residence  Current DME Used/Available at Home: Environmental consultant, rolling  Critical Behavior:  Neurologic State: Alert  Orientation Level: Oriented X4  Cognition: Appropriate decision making;Appropriate for age attention/concentration;Appropriate safety awareness;Follows commands  Safety/Judgement: Awareness of environment  Skin:    Strength:    Strength: Within functional limits  Tone & Sensation:   Tone: Normal  Sensation: Intact  Range Of Motion:  AROM: Within functional limits  PROM:  Within functional limits  Functional Mobility:  Bed Mobility:  Rolling: Supervision  Supine to Sit: Supervision  Transfers:  Sit to Stand: Supervision  Stand to Sit: Supervision  Balance:   Sitting: Intact  Standing: Intact;With support  Ambulation/Gait Training:  Distance (ft): 125 Feet (ft)  Assistive Device: Walker, rolling  Ambulation - Level of Assistance: Supervision  Gait Description (WDL): Exceptions to WDL  Gait Abnormalities: Decreased step clearance  Left Side Weight Bearing: As tolerated  Base of Support: Narrowed  Speed/Cadence: Slow  Step Length: Left shortened;Right shortened    Therapeutic Exercises:   Quad sets, hip abd, ankle pumps, heel slides x 10 ea  Pain:  Pain Scale 1: Numeric (0 - 10)  Pain Intensity 1: 5  Pain Location 1: Knee  Pain Orientation 1: Left  Pain Description 1: Aching;Sore(rest and repostioned)  Pain Intervention(s) 1: Medication (see MAR);Cold pack  Activity Tolerance:   Good  Please refer to the flowsheet for vital signs taken during this treatment.  After treatment:   [x]          Patient left in no apparent distress sitting up in chair  []          Patient left in no apparent distress in bed  [x]          Call bell left within reach  [x]          Nursing notified  []          Caregiver present  []          Bed alarm activated    COMMUNICATION/EDUCATION:   The patient's plan of care was discussed with: Registered Nurse.  [x]          Fall prevention education was provided and the patient/caregiver indicated understanding.  [x]          Patient/family have participated as able in goal setting and plan of care.  []   Patient/family agree to work toward stated goals and plan of care.  []          Patient understands intent and goals of therapy, but is neutral about his/her participation.  []          Patient is unable to participate in goal setting and plan of care.    Thank you for this referral.  Quintin CHRISTELLA Hirschfeld   Time Calculation: 30 mins

## 2018-09-08 NOTE — Progress Notes (Signed)
Received in bed A&O X4. Wife at bedside. Patient complaining of nausea and Zofran administered. Encouraged the use of the incentive spirometer and deep breathing. Dressing to left knee clean dry and intact. Bilateral venodynes and ice packs in use. TED stocking in place. Positive neuro checks. Plan of care discussed. MD's orders reviewed. Instructed to call for assistance. Bed in lowest position. Call bell within reach. Will continue to monitor. Opportunity provided for questions.

## 2018-09-08 NOTE — Op Note (Signed)
[facility]  [address]  [place], [place] [address]    PATIENT:  Joel Graham, Joel Graham  MRN:  [id number]  DOB:  1951/02/18  ACCT#:  [id number]  ADMIT DATE:  09/08/2018                                                       OPERATIVE REPORT    PROCEDURE DATE:  09/08/2018    SURGEON:  Joycie Peek, MD    ASSISTANT:  Richrd Humbles, PA    PREOPERATIVE DIAGNOSIS:  Primary osteoarthritis of the left knee.    POSTOPERATIVE DIAGNOSIS:  Primary osteoarthritis of the left knee.    PROCEDURE PERFORMED:  Left knee replacement.    IMPLANTS:  1.  Simplex P bone cement with antibiotic x2 bags.  2.  Femoral component was a posterior stabilized cement Triathlon size 6, left.  3.  Tibial implant was a posterior stabilized tibial Triathlon, Universal size 6.  4.  Tibial insert was a tibial posterior stabilized Triathlon X3, size 6 x 9 mm.  5.  Patella was an asymmetric patella by 38 mm X3 Polyethelene, 38 x 11 mm.    ANESTHESIA:  Abductor canal block followed by attempted spinal followed by general  LMA anesthetic.    COMPLICATIONS:  None.    SPECIMENS REMOVED:  Some bone from the left knee to Pathology.    INJECTABLES:  1500 mg of tranexamic acid and 1 g of vancomycin power, 0.5 g on the  tibial plateau, the other half mixed in the tranexamic acid.    INDICATIONS:  The patient is a 67 year old male with progressive pain in the left  knee.  Symptoms have continued despite activity modification, medication, injection  and medicines with continued pain, pain at night and pain with activities of daily  living.  The patient asked for left knee replacement.  Risks to include, but not  limited to bleeding, infection, nerve damage, stiffness, DVT, PE, decubiti,  periprosthetic fracture.  He understands this and opts for surgical intervention.    PROCEDURE:  He was taken to the operating room after induction of adductor canal  block followed by spinal anesthetic.  He still had sensation in the left leg.  LMA  was  placed.  Venodyne was on the right leg.  Tourniquet was placed on the left.  Left  leg was prepped and draped for left knee procedure.  Time-out was completed  confirming the left side as appropriate side.  The antibiotics were infused.  Leg was  elevated, exsanguinated with an Esmarch, and the tourniquet was inflated to 275 mmHg.  Incision was made down from the tibial tubercle proximal in line with the patella  three fingerbreadths proximal to superior pole of patella.  Sharp dissection was  carried down to the level of patella and quadriceps tendons.  Medial and lateral  flaps were developed and the retinaculum was marked at the 9 o'clock position on the  patellar retinaculum.  Median parapatellar arthrotomy was carried out and median  subperiosteal dissection was carried out posterior to the mid coronal line on the  proximal tibia.  Fat in the anterior horn of the medial meniscus was removed sharply  with the Bovie.  Fat pad in anterior horn of the lateral meniscus and parapatellar  fat was removed sharply  with the Bovie.  Internal lateral release was carried out at  the patellar retinaculum with the Bovie.  Fat from the anterior phalange of the femur  was removed at the articular surface distally at the knee.  Patella was everted.   Patellar retractor was placed lateral, Z retractor medial.  ACL and PCL remnants were  removed and a pin followed by step-cut reamer was used to access the femoral canal.   Distal femoral cutting block was placed with distal femoral cut of 8 mm and 5 degrees  of valgus.  With this completed, the transepicondylar axis was marked and the tibia  was cut.  Tibia was exposed with the Hohmann placed anterior to the footprint of the  PCL.  Proximal tibia cut was completed, and the tibia sized to a size 6.  Femur then  sized with attention to rotation to a size 6.  Anterior posterior cuts were completed  with PA protecting soft tissue structures.  Soft tissue balancing was carried out  with  the lamina spreader Kocher curved osteotome, and this balanced to a size 9  spacer block in flexion and extension with no instability noted.  Chamfer cuts were  then completed followed by the box cut on the femur.  Size 6 femur trial was placed.   Tibial trial size 6 with a 9-mm trial in position knee was floated into extension.   Knee was stable in full extension to varus-valgus positioning and stable at 90  degrees of flexion in the internal-external rotation.  Tibial baseplate was then  pinned with attention to rotation.  Freehand cut was completed on the patella.   Patella sized to a 38.  The patellar button in, no-touch technique allowed for  central tracking of the patella.  Trial gear was then removed with exception of the  tibial baseplate.  Tower reamer and punch were then utilized to repair the tibia.   Bone plug followed by a bone wax was used to occlude the femoral canal access site.   A liter of normal saline followed by a 3-minute bath with Betadine.  A second  3-minute bath with Betadine, total of two was utilized given on the Esmarch, which  was on the back table and not utilized for the patient or near any implants, was  noted to have possibly packing material that was not expected.  It may have come in  with the material or not, so in this scenario, we changed gloves, passed off the  patellar retractor and the Esmarch.  We draped that area on the Mayo stand with all  gloves changed and the Esmarch off.  The two sessions of 3-minute Betadine was used  followed by two sessions of 3-minute peroxide bath was utilized, and a total of 3  liters of normal saline was used to irrigate the wound.  With this completed, the  tibia, femur, and patella were ready for cementing.  A 0.5 g of vancomycin powder was  placed on the tibial baseplate.  Tibia, femur, and the patella were cemented  sequentially with a 9-mm poly in position.  The knee was left in extension until the  cement was cured.  The knee was flexed,  and the 9-mm poly was removed with the  tourniquet deflated to evaluate for posterior bleeding, no posterior bleeding was  identified.  The 0 V-Loc was used to close the arthrotomy in running fashion per  protocol.  The tranexamic acid with rest of the 0.5 g of  vancomycin powder was then  injected into the knee.  2-0 Vicryl and 3-0 Vicryl were used to close deep and  superficial subcu.  Staples used to close the skin.  Mepilex dressing was placed.   Venodyne placed on the right leg.  X-ray of the right knee was completed in the  operating room prior to taking the patient to recovery and two views of the left knee  were completed.  There was no periarticular fracture.  No metallic foreign bodies  identified.  The patient was then taken to recovery room in stable condition.                       __________________________________________________                                   Joycie PeekJOHN S Alvina Strother, MD    DD:  09/08/2018 18:59:29/JJ      DT:  09/08/2018 19:06:[id number]  Job #:  [id number] / [id number]

## 2018-09-08 NOTE — Anesthesia Pre-Procedure Evaluation (Signed)
Relevant Problems   No relevant active problems       Anesthetic History     PONV          Review of Systems / Medical History  Patient summary reviewed, nursing notes reviewed and pertinent labs reviewed    Pulmonary  Within defined limits                 Neuro/Psych   Within defined limits           Cardiovascular              CAD    Exercise tolerance: >4 METS     GI/Hepatic/Renal         Renal disease: stones       Endo/Other        Arthritis     Other Findings              Physical Exam    Airway  Mallampati: II  TM Distance: 4 - 6 cm  Neck ROM: normal range of motion   Mouth opening: Normal     Cardiovascular  Regular rate and rhythm,  S1 and S2 normal,  no murmur, click, rub, or gallop  Rhythm: regular  Rate: normal         Dental  No notable dental hx       Pulmonary  Breath sounds clear to auscultation               Abdominal  GI exam deferred       Other Findings            Anesthetic Plan    ASA: 3  Anesthesia type: general      Post-op pain plan if not by surgeon: peripheral nerve block single    Induction: Intravenous  Anesthetic plan and risks discussed with: Patient

## 2018-09-09 LAB — CBC W/O DIFF
ABSOLUTE NRBC: 0 10*3/uL (ref 0.0–0.01)
HCT: 34.1 % — ABNORMAL LOW (ref 42.0–52.0)
HGB: 11.3 g/dL — ABNORMAL LOW (ref 14.0–18.0)
MCH: 31.5 PG (ref 27.0–35.0)
MCHC: 33.1 g/dL (ref 30.7–37.3)
MCV: 95 FL — ABNORMAL HIGH (ref 81.0–94.0)
MPV: 9.2 FL (ref 9.2–11.8)
NRBC: 0 PER 100 WBC
PLATELET: 138 10*3/uL (ref 130–400)
RBC: 3.59 M/uL — ABNORMAL LOW (ref 4.70–6.00)
RDW: 11.9 % (ref 11.5–14.0)
WBC: 8.1 10*3/uL (ref 4.8–10.6)

## 2018-09-09 LAB — CBC
Hematocrit: 34.1 % — ABNORMAL LOW (ref 42.0–52.0)
Hemoglobin: 11.3 g/dL — ABNORMAL LOW (ref 14.0–18.0)
MCH: 31.5 PG (ref 27.0–35.0)
MCHC: 33.1 g/dL (ref 30.7–37.3)
MCV: 95 FL — ABNORMAL HIGH (ref 81.0–94.0)
MPV: 9.2 FL (ref 9.2–11.8)
NRBC Absolute: 0 10*3/uL (ref 0.0–0.01)
Nucleated RBCs: 0 PER 100 WBC
Platelets: 138 10*3/uL (ref 130–400)
RBC: 3.59 M/uL — ABNORMAL LOW (ref 4.70–6.00)
RDW: 11.9 % (ref 11.5–14.0)
WBC: 8.1 10*3/uL (ref 4.8–10.6)

## 2018-09-09 MED ORDER — ASPIRIN 325 MG TAB, DELAYED RELEASE
325 mg | ORAL_TABLET | Freq: Two times a day (BID) | ORAL | 0 refills | Status: AC
Start: 2018-09-09 — End: ?

## 2018-09-09 MED ORDER — DOCUSATE SODIUM 100 MG CAP
100 mg | ORAL_CAPSULE | Freq: Two times a day (BID) | ORAL | 2 refills | Status: AC
Start: 2018-09-09 — End: 2018-12-08

## 2018-09-09 MED ORDER — OXYCODONE 5 MG TAB
5 mg | ORAL_TABLET | ORAL | 0 refills | Status: AC | PRN
Start: 2018-09-09 — End: 2018-09-12

## 2018-09-09 MED FILL — LEVOTHYROXINE 50 MCG TAB: 50 mcg | ORAL | Qty: 1

## 2018-09-09 MED FILL — OXYCODONE 5 MG TAB: 5 mg | ORAL | Qty: 1

## 2018-09-09 MED FILL — BUPIVACAINE (PF) 0.75 % (7.5 MG/ML) IJ SOLN: 0.75 % (7.5 mg/mL) | INTRAMUSCULAR | Qty: 12

## 2018-09-09 MED FILL — DOK 100 MG CAPSULE: 100 mg | ORAL | Qty: 1

## 2018-09-09 MED FILL — ASPIRIN 325 MG TAB, DELAYED RELEASE: 325 mg | ORAL | Qty: 1

## 2018-09-09 MED FILL — CHOLECALCIFEROL (VITAMIN D3) 1,000 UNIT (25 MCG) TAB: ORAL | Qty: 1

## 2018-09-09 MED FILL — KETOROLAC TROMETHAMINE 15 MG/ML INJECTION: 15 mg/mL | INTRAMUSCULAR | Qty: 1

## 2018-09-09 MED FILL — DEXAMETHASONE SODIUM PHOSPHATE 4 MG/ML IJ SOLN: 4 mg/mL | INTRAMUSCULAR | Qty: 4

## 2018-09-09 MED FILL — PANTOPRAZOLE 40 MG TAB, DELAYED RELEASE: 40 mg | ORAL | Qty: 1

## 2018-09-09 MED FILL — OXYCONTIN 10 MG TABLET,CRUSH RESISTANT,EXTENDED RELEASE: 10 mg | ORAL | Qty: 1

## 2018-09-09 MED FILL — ROPINIROLE 0.25 MG TAB: 0.25 mg | ORAL | Qty: 2

## 2018-09-09 MED FILL — FINASTERIDE 5 MG TAB: 5 mg | ORAL | Qty: 1

## 2018-09-09 MED FILL — SENSORCAINE-MPF 0.25 % (2.5 MG/ML) INJECTION SOLUTION: 0.25 % (2.5 mg/mL) | INTRAMUSCULAR | Qty: 9

## 2018-09-09 MED FILL — ATORVASTATIN 20 MG TAB: 20 mg | ORAL | Qty: 2

## 2018-09-09 MED FILL — DIPRIVAN 10 MG/ML INTRAVENOUS EMULSION: 10 mg/mL | INTRAVENOUS | Qty: 185.85

## 2018-09-09 MED FILL — LACTATED RINGERS IV: INTRAVENOUS | Qty: 1000

## 2018-09-09 MED FILL — OXYCODONE 5 MG TAB: 5 mg | ORAL | Qty: 2

## 2018-09-09 MED FILL — EPHEDRINE SULFATE 50 MG/ML INTRAVENOUS SOLUTION: 50 mg/mL | INTRAVENOUS | Qty: 30

## 2018-09-09 MED FILL — CEFAZOLIN 2 GM/50 ML IN DEXTROSE (ISO-OSMOTIC) IVPB: 2 gram/50 mL | INTRAVENOUS | Qty: 50

## 2018-09-09 NOTE — Progress Notes (Signed)
Post op day 1    S/p left knee replacement, pain expected.    Visit Vitals  BP 104/54   Pulse (!) 53   Temp 98.6 ??F (37 ??C)   Resp 20   Ht 5' 9" (1.753 m)   Wt 59 kg (130 lb)   SpO2 98%   BMI 19.20 kg/m??     Dressing on and dry left knee    Dorsiflexion intact left    Recent Results (from the past 24 hour(s))   CBC W/O DIFF    Collection Time: 09/09/18  5:57 AM   Result Value Ref Range    WBC 8.1 4.8 - 10.6 K/uL    RBC 3.59 (L) 4.70 - 6.00 M/uL    HGB 11.3 (L) 14.0 - 18.0 g/dL    HCT 34.1 (L) 42.0 - 52.0 %    MCV 95.0 (H) 81.0 - 94.0 FL    MCH 31.5 27.0 - 35.0 PG    MCHC 33.1 30.7 - 37.3 g/dL    RDW 11.9 11.5 - 14.0 %    PLATELET 138 130 - 400 K/uL    MPV 9.2 9.2 - 11.8 FL    NRBC 0.0 0.0 PER 100 WBC    ABSOLUTE NRBC 0.00 0.0 - 0.01 K/uL     Impression/plan: left knee replacement    Stable post op    Plan discharge today after PT

## 2018-09-09 NOTE — Progress Notes (Signed)
physical Therapy TREATMENT    Patient: Joel Graham (67 y.o. male)  Date: 09/09/2018  Diagnosis: Osteoarthritis of left knee, unspecified osteoarthritis type [M17.12]  Primary osteoarthritis of left knee [M17.12] Primary osteoarthritis of left knee  Procedure(s) (LRB):  LEFT TOTAL KNEE ARTHROPLASTY (Left) 1 Day Post-Op  Precautions: WBAT  Chart, physical therapy assessment, plan of care and goals were reviewed.    ASSESSMENT:  Pt tolerated session well;  Pt demonstrates independence with transfer sit-to-stand.  Pt ambulated 250 ft with RW and supervision.  Pt able to negotiate stairs with proper sequence with supervision using handrail.  Pt also able to negotiate stairs without handrail with CGA of PT.    Progression toward goals:  [x]      Improving appropriately and progressing toward goals  []      Improving slowly and progressing toward goals  []      Not making progress toward goals and plan of care will be adjusted     PLAN:  Patient continues to benefit from skilled intervention to address the above impairments.  Continue treatment per established plan of care.  Discharge Recommendations:  Home Health  Further Equipment Recommendations for Discharge:  RW, pt has his own     SUBJECTIVE:   Patient stated ???I did not sleep well due to discomfort and I've always had restless leg syndrome.???    OBJECTIVE DATA SUMMARY:   Critical Behavior:  Neurologic State: Alert  Orientation Level: Oriented X4  Cognition: Follows commands  Safety/Judgement: Awareness of environment, Insight into deficits  Functional Mobility Training:  Bed Mobility:     Supine to Sit: Independent               Transfers:  Sit to Stand: Independent  Stand to Sit: Independent                             Balance:  Sitting: Intact  Standing: Intact;With support  Ambulation/Gait Training:  Distance (ft): 250 Feet (ft)  Assistive Device: Walker, rolling  Ambulation - Level of Assistance: Supervision        Gait Abnormalities: Antalgic;Step to gait      Left Side Weight Bearing: As tolerated     Stance: Left decreased  Speed/Cadence: Slow  Step Length: Left shortened;Right shortened                  Stairs:  Number of Stairs Trained: 10  Stairs - Level of Assistance: Supervision  Rail Use: Both(pt able to perform without handrails  with CGA)  Neuro Re-Education:      Therapeutic Exercises:   Standing heel raises, toe raises, hip flx, hip abd all 10x bilat    Pain:  Pain Scale 1: Visual  Pain Intensity 1: 0  Pain Location 1: Knee  Pain Orientation 1: Left  Pain Description 1: Aching  Pain Intervention(s) 1: Medication (see MAR);Ice;Position  Activity Tolerance:   Tolerated well    Please refer to the flowsheet for vital signs taken during this treatment.  After treatment:   [x] Patient left in no apparent distress sitting up in chair  [] Patient left in no apparent distress in bed  [x] Call bell left within reach  [x] Nursing notified  [] Caregiver present  [] Bed alarm activated    COMMUNICATION/COLLABORATION:   The patient???s plan of care was discussed with: Registered Nurse     Raymond M Younghans  09/09/2018  11:39 AM

## 2018-09-09 NOTE — Progress Notes (Signed)
Occupational Therapy TREATMENT    Patient: Joel Graham ([age] y.o. male)  Date: 09/09/2018  Diagnosis: Osteoarthritis of left knee, unspecified osteoarthritis type [M17.12]  Primary osteoarthritis of left knee [M17.12] Primary osteoarthritis of left knee  Procedure(s) (LRB):  LEFT TOTAL KNEE ARTHROPLASTY (Left) 1 Day Post-Op  Precautions: WBAT  Chart, occupational therapy assessment, plan of care, and goals were reviewed.    ASSESSMENT:  Pt. Presented in bedside chair, pleasant and compliant, agreeable to participation in skilled OT tx for ADL training.  Pt. Demonstrated bed and bathroom mobility, self care and transfers while maintaining good safety throughout.  Pt ambulated 45' throughout session.    Progression toward goals:  [x]           Improving appropriately and progressing toward goals  []           Improving slowly and progressing toward goals  []           Not making progress toward goals and plan of care will be adjusted     PLAN:  Patient continues to benefit from skilled intervention to address the above impairments.  Continue treatment per established plan of care.  Discharge Recommendations:  Home Health  Further Equipment Recommendations for Discharge:  none     SUBJECTIVE:   Patient stated ???I am very independent and want to remain that way.Marland Kitchen.???    OBJECTIVE DATA SUMMARY:   Cognitive/Behavioral Status:  Neurologic State: Alert  Orientation Level: Oriented X4  Cognition: Follows commands  Safety/Judgement: Awareness of environment, Insight into deficits  Functional Mobility and Transfers for ADLs:   Bed Mobility:      Modified I with all bed mobility.         Transfers:      Modified I with all transfers                                Balance:   Good  ADL Intervention:         Patient demonstrated the ability to dress himself (UB/LB) without need for adaptive equipment (sock aide, dressing stick) Modified I and Minimal verbal cues for safety and efficiently.     Patient also demonstrated toileting including transfers, clothing mgt and hygiene with Modified independence and no need for a commode.     Throughout tx Pt ambulated 45'                                      Pain:  Pain Scale 1: Numeric (0 - 10)  Pain Intensity 1: 5  Pain Location 1: Knee  Pain Orientation 1: Left  Pain Description 1: Aching  Pain Intervention(s) 1: Medication (see MAR);Ice;Position    Activity Tolerance:    Good  Please refer to the flowsheet for vital signs taken during this treatment.  After treatment:   [x]   Patient left in no apparent distress sitting up in chair  []   Patient left in no apparent distress in bed  [x]   Call bell left within reach  []   Nursing notified  []   Caregiver present  []   Bed alarm activated    COMMUNICATION/COLLABORATION:   The patient???s plan of care was discussed with: patient    Joel Graham  Time Calculation: 30 mins

## 2018-09-09 NOTE — Other (Signed)
Discharge instruction discussed with patient and caregiver. Opportunity for questions provided. HL discontinued. Pressure dressing applied.

## 2018-09-09 NOTE — Progress Notes (Signed)
Patient afebrile, dressing to left knee clean, dry and intact, good pedal pulses and cap refill. Ambulating well. Good pain control with current pain meds. Tolerating diet and voiding well.

## 2018-09-09 NOTE — Other (Signed)
Bedside and Verbal shift change report given to Gen Rebaya  (oncoming nurse) by Debby Nieves RN (offgoing nurse). Report included the following information SBAR, Kardex, Intake/Output, MAR and Recent Results.

## 2018-09-09 NOTE — Progress Notes (Signed)
physical Therapy TREATMENT    Patient: Joel Graham (56(67 y.o. male)  Date: 09/09/2018  Diagnosis: Osteoarthritis of left knee, unspecified osteoarthritis type [M17.12]  Primary osteoarthritis of left knee [M17.12] Primary osteoarthritis of left knee  Procedure(s) (LRB):  LEFT TOTAL KNEE ARTHROPLASTY (Left) 1 Day Post-Op  Precautions: WBAT  Chart, physical therapy assessment, plan of care and goals were reviewed.    ASSESSMENT:  Pt performed all functional mobility with independence. Pt amb 250' with RW and SUP. Pt navigated 10 stairs with B HR and SUP. Pt performed seated knee flex/ext AROM x 10 each. For more details see below.    Progression toward goals:  [x]       Improving appropriately and progressing toward goals  []       Improving slowly and progressing toward goals  []       Not making progress toward goals and plan of care will be adjusted     PLAN:  Patient continues to benefit from skilled intervention to address the above impairments.  Continue treatment per established plan of care.  Discharge Recommendations:  Home Health  Further Equipment Recommendations for Discharge:  Pt has RW     SUBJECTIVE:   Patient stated ???My knee is a little uncomfortable but it isn't too bad???    OBJECTIVE DATA SUMMARY:   Critical Behavior:  Neurologic State: Alert  Orientation Level: Oriented X4  Cognition: Follows commands  Safety/Judgement: Awareness of environment, Insight into deficits  Functional Mobility Training:  Bed Mobility:  Supine to Sit: Independent  Sit to Supine: Independent  Transfers:  Sit to Stand: Independent  Stand to Sit: Independent  Balance:  Sitting: Intact  Standing: Intact  Ambulation/Gait Training:  Distance (ft): 250 Feet (ft)  Assistive Device: Walker, rolling  Ambulation - Level of Assistance: Supervision  Gait Abnormalities: Antalgic;Step to gait  Left Side Weight Bearing: As tolerated  Base of Support: Narrowed  Stance: Left decreased  Speed/Cadence: Slow   Step Length: Left shortened;Right shortened   Stairs:  Number of Stairs Trained: 10  Stairs - Level of Assistance: Supervision  Rail Use: Both    Therapeutic Exercises:   Knee flex/ ext AROM x 10    Pain:  Pain Scale 1: Numeric (0 - 10)  Pain Intensity 1: 4  Pain Location 1: Knee  Pain Orientation 1: Left  Pain Description 1: Aching;Sore(ice and repositioned)  Pain Intervention(s) 1: Medication (see MAR);Ice;Position  Activity Tolerance:   Good  Please refer to the flowsheet for vital signs taken during this treatment.  After treatment:   []  Patient left in no apparent distress sitting up in chair  [x]  Patient left in no apparent distress in bed  [x]  Call bell left within reach  [x]  Nursing notified  []  Caregiver present  []  Bed alarm activated    COMMUNICATION/COLLABORATION:   The patient???s plan of care was discussed with: Registered Nurse     Joel Graham  09/09/2018  1:00 PM

## 2018-09-09 NOTE — Progress Notes (Incomplete)
67 year old male admitted s/p knee replacement.  Spoke with patient at bed side.  Patient lives at home with his wife.  At home they have 2 steps to enter home and a flight of stairs to the basement which he will avoid while recovering.  Patient is independent in ADL's and ambulation.  Patient has his own walker at his bed side.  He does not feel he needs a commode and refuses to have one.  He has grab bars which he feels will be enough.  Discussed discharge plan.  Patient feels that he will benefit from home PT.  List provided.  Patient is choosing [facility].  [facility]  Discharge plan is home with home health.     Care Management Interventions  PCP Verified by CM: Yes(Dr. Herbie SaxonAttardo)  Mode of Transport at Discharge: Other (see comment)(Wife)  Transition of Care Consult (CM Consult): Discharge Planning, Home Health([facility])  Discharge Durable Medical Equipment: No  Physical Therapy Consult: Yes  Occupational Therapy Consult: Yes  Current Support Network: Own Home, Lives with Spouse  Confirm Follow Up Transport: Family  The Patient and/or Patient Representative was Provided with a Choice of Provider and Agrees with the Discharge Plan?: Yes  Name of the Patient Representative Who was Provided with a Choice of Provider and Agrees with the Discharge Plan: Park BreedWilliam Jaworski  Freedom of Choice List was Provided with Basic Dialogue that Supports the Patient's Individualized Plan of Care/Goals, Treatment Preferences and Shares the Quality Data Associated with the Providers?: Yes  Discharge Location  Discharge Placement: Home with home health([facility])

## 2018-09-09 NOTE — Progress Notes (Signed)
Occupational Therapy TREATMENT    Patient: Joel Graham (67 y.o. male)  Date: 09/09/2018  Diagnosis: Osteoarthritis of left knee, unspecified osteoarthritis type [M17.12]  Primary osteoarthritis of left knee [M17.12] Primary osteoarthritis of left knee  Procedure(s) (LRB):  LEFT TOTAL KNEE ARTHROPLASTY (Left) 1 Day Post-Op  Precautions: WBAT  Chart, occupational therapy assessment, plan of care, and goals were reviewed.    ASSESSMENT:  Pt. Presented in bedside chair, pleasant and compliant, agreeable to participation in skilled OT tx for ADL training.  Pt. Demonstrated bed and bathroom mobility, self care and transfers while maintaining good safety throughout.  Pt ambulated 45' throughout session.    Progression toward goals:  [x]           Improving appropriately and progressing toward goals  []           Improving slowly and progressing toward goals  []           Not making progress toward goals and plan of care will be adjusted     PLAN:  Patient continues to benefit from skilled intervention to address the above impairments.  Continue treatment per established plan of care.  Discharge Recommendations:  Home Health  Further Equipment Recommendations for Discharge:  none     SUBJECTIVE:   Patient stated "I am very independent and want to remain that way.."    OBJECTIVE DATA SUMMARY:   Cognitive/Behavioral Status:  Neurologic State: Alert  Orientation Level: Oriented X4  Cognition: Follows commands  Safety/Judgement: Awareness of environment, Insight into deficits  Functional Mobility and Transfers for ADLs:   Bed Mobility:      Modified I with all bed mobility.         Transfers:      Modified I with all transfers                                Balance:   Good  ADL Intervention:         Patient demonstrated the ability to dress himself (UB/LB) without need for adaptive equipment (sock aide, dressing stick) Modified I and Minimal verbal cues for safety and efficiently.    Patient also demonstrated toileting  including transfers, clothing mgt and hygiene with Modified independence and no need for a commode.     Throughout tx Pt ambulated 45'                                      Pain:  Pain Scale 1: Numeric (0 - 10)  Pain Intensity 1: 5  Pain Location 1: Knee  Pain Orientation 1: Left  Pain Description 1: Aching  Pain Intervention(s) 1: Medication (see MAR);Ice;Position    Activity Tolerance:    Good  Please refer to the flowsheet for vital signs taken during this treatment.  After treatment:   [x]   Patient left in no apparent distress sitting up in chair  []   Patient left in no apparent distress in bed  [x]   Call bell left within reach  []   Nursing notified  []   Caregiver present  []   Bed alarm activated    COMMUNICATION/COLLABORATION:   The patient's plan of care was discussed with: patient    Altamese Carolina  Time Calculation: 30 mins

## 2018-09-09 NOTE — Progress Notes (Signed)
 physical Therapy TREATMENT    Patient: Joel Graham (67 y.o. male)  Date: 09/09/2018  Diagnosis: Osteoarthritis of left knee, unspecified osteoarthritis type [M17.12]  Primary osteoarthritis of left knee [M17.12] Primary osteoarthritis of left knee  Procedure(s) (LRB):  LEFT TOTAL KNEE ARTHROPLASTY (Left) 1 Day Post-Op  Precautions: WBAT  Chart, physical therapy assessment, plan of care and goals were reviewed.    ASSESSMENT:  Pt performed all functional mobility with independence. Pt amb 250' with RW and SUP. Pt navigated 10 stairs with B HR and SUP. Pt performed seated knee flex/ext AROM x 10 each. For more details see below.    Progression toward goals:  [x]       Improving appropriately and progressing toward goals  []       Improving slowly and progressing toward goals  []       Not making progress toward goals and plan of care will be adjusted     PLAN:  Patient continues to benefit from skilled intervention to address the above impairments.  Continue treatment per established plan of care.  Discharge Recommendations:  Home Health  Further Equipment Recommendations for Discharge:  Pt has RW     SUBJECTIVE:   Patient stated "My knee is a little uncomfortable but it isn't too bad"    OBJECTIVE DATA SUMMARY:   Critical Behavior:  Neurologic State: Alert  Orientation Level: Oriented X4  Cognition: Follows commands  Safety/Judgement: Awareness of environment, Insight into deficits  Functional Mobility Training:  Bed Mobility:  Supine to Sit: Independent  Sit to Supine: Independent  Transfers:  Sit to Stand: Independent  Stand to Sit: Independent  Balance:  Sitting: Intact  Standing: Intact  Ambulation/Gait Training:  Distance (ft): 250 Feet (ft)  Assistive Device: Walker, rolling  Ambulation - Level of Assistance: Supervision  Gait Abnormalities: Antalgic;Step to gait  Left Side Weight Bearing: As tolerated  Base of Support: Narrowed  Stance: Left decreased  Speed/Cadence: Slow  Step Length: Left shortened;Right  shortened   Stairs:  Number of Stairs Trained: 10  Stairs - Level of Assistance: Supervision  Rail Use: Both    Therapeutic Exercises:   Knee flex/ ext AROM x 10    Pain:  Pain Scale 1: Numeric (0 - 10)  Pain Intensity 1: 4  Pain Location 1: Knee  Pain Orientation 1: Left  Pain Description 1: Aching;Sore(ice and repositioned)  Pain Intervention(s) 1: Medication (see MAR);Ice;Position  Activity Tolerance:   Good  Please refer to the flowsheet for vital signs taken during this treatment.  After treatment:   []  Patient left in no apparent distress sitting up in chair  [x]  Patient left in no apparent distress in bed  [x]  Call bell left within reach  [x]  Nursing notified  []  Caregiver present  []  Bed alarm activated    COMMUNICATION/COLLABORATION:   The patient's plan of care was discussed with: Registered Nurse     Quintin CHRISTELLA Hirschfeld  09/09/2018  1:00 PM

## 2018-09-09 NOTE — Progress Notes (Signed)
Patient afebrile, dressing to left knee clean, dry and intact, good pedal pulses and cap refill. Ambulating well. Good pain control with current pain meds. Tolerating diet and voiding well.

## 2018-09-09 NOTE — Progress Notes (Signed)
Post op day 1    S/p left knee replacement, pain expected.    Visit Vitals  BP 104/54   Pulse (!) 53   Temp 98.6 ??F (37 ??C)   Resp 20   Ht 5\' 9"  (1.753 m)   Wt 59 kg (130 lb)   SpO2 98%   BMI 19.20 kg/m??     Dressing on and dry left knee    Dorsiflexion intact left    Recent Results (from the past 24 hour(s))   CBC W/O DIFF    Collection Time: 09/09/18  5:57 AM   Result Value Ref Range    WBC 8.1 4.8 - 10.6 K/uL    RBC 3.59 (L) 4.70 - 6.00 M/uL    HGB 11.3 (L) 14.0 - 18.0 g/dL    HCT 91.434.1 (L) 78.242.0 - 52.0 %    MCV 95.0 (H) 81.0 - 94.0 FL    MCH 31.5 27.0 - 35.0 PG    MCHC 33.1 30.7 - 37.3 g/dL    RDW 95.611.9 21.311.5 - 08.614.0 %    PLATELET 138 130 - 400 K/uL    MPV 9.2 9.2 - 11.8 FL    NRBC 0.0 0.0 PER 100 WBC    ABSOLUTE NRBC 0.00 0.0 - 0.01 K/uL     Impression/plan: left knee replacement    Stable post op    Plan discharge today after PT

## 2018-09-09 NOTE — Progress Notes (Signed)
 physical Therapy TREATMENT    Patient: Joel Graham (67 y.o. male)  Date: 09/09/2018  Diagnosis: Osteoarthritis of left knee, unspecified osteoarthritis type [M17.12]  Primary osteoarthritis of left knee [M17.12] Primary osteoarthritis of left knee  Procedure(s) (LRB):  LEFT TOTAL KNEE ARTHROPLASTY (Left) 1 Day Post-Op  Precautions: WBAT  Chart, physical therapy assessment, plan of care and goals were reviewed.    ASSESSMENT:  Pt tolerated session well;  Pt demonstrates independence with transfer sit-to-stand.  Pt ambulated 250 ft with RW and supervision.  Pt able to negotiate stairs with proper sequence with supervision using handrail.  Pt also able to negotiate stairs without handrail with CGA of PT.    Progression toward goals:  [x]       Improving appropriately and progressing toward goals  []       Improving slowly and progressing toward goals  []       Not making progress toward goals and plan of care will be adjusted     PLAN:  Patient continues to benefit from skilled intervention to address the above impairments.  Continue treatment per established plan of care.  Discharge Recommendations:  Home Health  Further Equipment Recommendations for Discharge:  RW, pt has his own     SUBJECTIVE:   Patient stated "I did not sleep well due to discomfort and I've always had restless leg syndrome."    OBJECTIVE DATA SUMMARY:   Critical Behavior:  Neurologic State: Alert  Orientation Level: Oriented X4  Cognition: Follows commands  Safety/Judgement: Awareness of environment, Insight into deficits  Functional Mobility Training:  Bed Mobility:     Supine to Sit: Independent               Transfers:  Sit to Stand: Independent  Stand to Sit: Independent                             Balance:  Sitting: Intact  Standing: Intact;With support  Ambulation/Gait Training:  Distance (ft): 250 Feet (ft)  Assistive Device: Walker, rolling  Ambulation - Level of Assistance: Supervision        Gait Abnormalities: Antalgic;Step to gait      Left Side Weight Bearing: As tolerated     Stance: Left decreased  Speed/Cadence: Slow  Step Length: Left shortened;Right shortened                  Stairs:  Number of Stairs Trained: 10  Stairs - Level of Assistance: Supervision  Rail Use: Both(pt able to perform without handrails  with CGA)  Neuro Re-Education:      Therapeutic Exercises:   Standing heel raises, toe raises, hip flx, hip abd all 10x bilat    Pain:  Pain Scale 1: Visual  Pain Intensity 1: 0  Pain Location 1: Knee  Pain Orientation 1: Left  Pain Description 1: Aching  Pain Intervention(s) 1: Medication (see MAR);Ice;Position  Activity Tolerance:   Tolerated well    Please refer to the flowsheet for vital signs taken during this treatment.  After treatment:   [x]  Patient left in no apparent distress sitting up in chair  []  Patient left in no apparent distress in bed  [x]  Call bell left within reach  [x]  Nursing notified  []  Caregiver present  []  Bed alarm activated    COMMUNICATION/COLLABORATION:   The patient's plan of care was discussed with: Registered Nurse     Quintin HERO [facility]  09/09/2018  11:39 AM

## 2018-09-10 ENCOUNTER — Encounter: Admit: 2018-09-10 | Discharge: 2018-09-10 | Payer: PRIVATE HEALTH INSURANCE | Primary: Family Medicine

## 2018-09-12 ENCOUNTER — Encounter: Admit: 2018-09-12 | Discharge: 2018-09-12 | Payer: PRIVATE HEALTH INSURANCE | Primary: Family Medicine

## 2018-09-13 ENCOUNTER — Encounter: Admit: 2018-09-13 | Discharge: 2018-09-13 | Payer: PRIVATE HEALTH INSURANCE | Primary: Family Medicine

## 2018-09-14 ENCOUNTER — Encounter: Admit: 2018-09-14 | Discharge: 2018-09-14 | Payer: PRIVATE HEALTH INSURANCE | Primary: Family Medicine

## 2018-09-16 ENCOUNTER — Encounter: Primary: Family Medicine

## 2018-09-17 ENCOUNTER — Encounter: Admit: 2018-09-17 | Discharge: 2018-09-17 | Payer: PRIVATE HEALTH INSURANCE | Primary: Family Medicine

## 2018-09-20 ENCOUNTER — Encounter: Admit: 2018-09-20 | Discharge: 2018-09-20 | Payer: PRIVATE HEALTH INSURANCE | Primary: Family Medicine

## 2018-09-21 ENCOUNTER — Encounter: Admit: 2018-09-21 | Discharge: 2018-09-21 | Payer: PRIVATE HEALTH INSURANCE | Primary: Family Medicine

## 2018-09-23 ENCOUNTER — Encounter: Admit: 2018-09-23 | Discharge: 2018-09-23 | Payer: PRIVATE HEALTH INSURANCE | Primary: Family Medicine

## 2018-09-28 ENCOUNTER — Inpatient Hospital Stay: Admit: 2018-09-28 | Payer: PRIVATE HEALTH INSURANCE | Primary: Family Medicine

## 2018-09-28 DIAGNOSIS — R262 Difficulty in walking, not elsewhere classified: Secondary | ICD-10-CM

## 2018-09-28 NOTE — Progress Notes (Signed)
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]   Referral Source: Joycie Peek., MD     Date:09/28/2018  Visit #: 1     Treatment Area: L TKR  Date of Surgery: 09/08/18  Protocol  []  Yes  Precautions/Restrictions:  Patients prefers to be treated behind curtain when appropriate:  []  Yes   Patient has therapist preference:   []  Male         []  Male         SUBJECTIVE  Pain Level (0-10 scale):               Pain Level (0-10 scale) post treatment:   Medication Changes: []  No    []  Yes (see paper medication log)  Subjective functional status/changes:   []  No changes reported    See IE.      OBJECTIVE    []  Patient is post-surgical and on a strict surgical protocol that must be followed per MD instructions and to benefit the patient's successful recovery, so treatment started today    Modality: [x]  see flow sheet      Therapeutic Activities: [x]  see flow sheet     []  Other:_  Manual Therapy:  []  see flow sheet     []  Other:_    Patient Education: [x]  Review HEP    []  Progressed/Changed HEP  []  positioning   []  posture/body mechanics   []  transfers   []  heat/ice application    Other Objective/Functional Measures:  [x]  see evaluation/re-evaluation/progress         ASSESSMENT  [x]   See evaluation/progress note/reevaluation  Educated pt on elevation to help improve swelling. Provided printout of exercises below for home.     Short Term Goals: To be accomplished in 3 weeks:  1)      The patient will demonstrate independence with HEP.  2)      The patient will ambulate with proper mechanics during stance phase of gait with full terminal hip/knee extension and 4/5 strength >200 ft.  Long Term Goals: To be accomplished in 6 weeks:   1)      The patient will be able to return to all household and full work activities without pain as demonstrated by LEFS>60.   2)      The patient will ascend/descend 1 flight of stairs with reciprocal pattern secondary to full LE ROM and 5/5 strength.  3)      The patient will be able to stand for 60 minutes without pain secondary to strength 5/5 in order to perform woodworking.   []   Patient will continue to benefit from skilled therapy to address remaining goals     PLAN  [x]   Upgrade activities as tolerated      [x]  Frequency/duration: 3x/week for 6 weeks  POC ends (date): 11/09/18  []   Continue plan of care    []   Discharge due to:_         Joel Graham  09/28/2018  Lower Extremity Therapy Flow Sheet  Joel Graham     Classify Intervention   1 2 3 4 5 6 7 8 9 10    Date:  09/28/18            Non timed procedure              Evaluation/re-eval/  D/C  IE            Moist heat/cold pack   MH/CP MH x10' supine            Electrical Stimulation   ES             Timed procedure                STM/MFR MT p scar (once steri strips off), retro for swelling            Joint mobilization MT p patella            Manual stretching/PassiveROM MT p flex, ext            Ultrasound US             Exercise/Activity              Quad set TE 20x5"            Heel slides TE x20 with SOS            Hip add ball/ Hip abd Tband TE p            Bridges TE p            March>SLR TE p            SAQ/LAQ TE x20 ea            Sidelying hip abd/clams TE p            Prone ham curls TE p            Strap Hamstring/Quad stretch TE p            Hip Stretches TE p stair stretches flex, ext            Standing alternating-  marches, ham curls, hip abd, hip ext TA p            Minisquats/TR/HR TA p            Tandem>SLS NMR p            Wobbleboard/BAPS NMR             TKE TA p            Sidestepping TA p            Forward step ups/Lateral Step ups TA p            Forward step downs>dips TA p            Cable column/Totalgym TE             LegPress TE             Recumbent bike/elliptical TE p             Treadmill/Elliptical TE                           Gait training GT             HEP updated  As above; printout provided    Educated on swelling  and elevation            Gait training 63875 GT              Moist Heat/Cold Pack 97010   EStim 97014     MH/CP    ES             Manual Therapy 97140 MT             Ultrasound 97035  [place]             Therapeutic exercise 97110 TE 25            Therapeutic Activity 97530 TA             Neuromuscular Reeducation 97112  NMR             TOTAL TREATMENT TIME  (untimed+timed minutes)  55            TIMED CODE TREATMENT  (timed code minutes-  shared minutes)  25            ** Medicare-add KX at 5th visit if PT&ST/10h visit if PT only**           Add KX    Therapist's Initials  KR

## 2018-09-28 NOTE — Progress Notes (Signed)
[] [facility], [place], [place], [place], Ph [phone]   Fax [phone]    [x] [facility], [place], [place]  Phone [phone]  Fax [phone]    [] [facility], [place], [place]: [phone]  Fax: [phone]    Plan of Care/ Statement of Necessity for Physical Therapy Services    Joel Graham        Date: 09/28/2018   DOB: 1951-07-18  Joycie Peek., MD  Diagnosis Codes: Difficulty in walking, not elsewhere classified [R26.2]  Pain in left knee [M25.562]  Presence of left artificial knee joint [Z96.652]  Onset Date: 09/08/18  Start of Care: 09/28/2018  Prior Level of Function: no limitations  Comorbidities: RLS; hx of bil meniscus surgeries    The Plan of Care and following information is based upon the initial evaluation.     Measures taken:       Medical intake forms       LEFS score 9/80; 89% limitation     Girth Measurements:   ??     joint line  10 cm above jt line  10 cm below jt line   Left 37.6 cm 37.4 cm 31.6 cm   ??  ROM / Strength  [] ? Unable to assess                  AROM                      PROM                   Strength (1-5)  ?? ?? Left Right Left Right Left Right   Hip Flexion ?? ?? ?? ?? 4 4   ?? Abduction ?? ?? ?? ?? 4 4   ?? Adduction ?? ?? ?? ?? 4+ 4+   Knee Flexion 72 132 75 ?? 3- 5   ?? Extension -3 0 ?? ?? 3- 5   Ankle Plantarflexion ?? ?? ?? ?? 5 5   ?? Dorsiflexion ?? ?? ?? ?? 4 5   ?? Inversion ?? ?? ?? ?? 4+ 5   ?? Eversion ?? ?? ?? ?? 4+ 5                                            [] ? all WNLs/WFLs     [] ? all WNLs/WFLs       [] ? all at least 4/5    Rhomberg EC: 30 sec    Tandem L: 30 sec, mod sway  Tandem R: 30 sec, min sway    SLS L: 30 sec, mod sway, pain  SLS R: 30 sec, min sway    Assessment:   Pt is a 68 year old Male who presents with limited mobility s/p L TKR on 09/08/18. He demonstrates significant limitation in knee flexion. Moderate pitting edema t/o knee and lower leg. Pt educated in HEP focusing on  bending his knee as well as elevation to help improve swelling. He will benefit from skilled physical therapy to improve pain, increase ROM and strength, and improve gait and balance in order to return to PLOF, including walking, stairs, standing for long periods of time, driving, and working in Aeronautical [employer].    Problem List:   pain affecting function, decrease ROM, decrease strength, edema affecting function, impaired gait/ balance, decrease ADL/ functional  abilitiies, decrease activity tolerance and decrease flexibility/ joint mobility     Patient Goal (s): walk without pain  Rehabilitation Potential: good    Short Term Goals: To be accomplished in 3 weeks:  1)      The patient will demonstrate independence with HEP.  2)      The patient will ambulate with proper mechanics during stance phase of gait with full terminal hip/knee extension and 4/5 strength >200 ft.  Long Term Goals: To be accomplished in 6 weeks:   1)      The patient will be able to return to all household and full work activities without pain as demonstrated by LEFS>60.  2)      The patient will ascend/descend 1 flight of stairs with reciprocal pattern secondary to full LE ROM and 5/5 strength.  3)      The patient will be able to stand for 60 minutes without pain secondary to strength 5/5 in order to perform woodworking.     Persons(s) to be included in education:   patient (P)  Patient / Family readiness to learn indicated by: asking questions and interest  Barriers to Learning/Limitations: None  Patient/ Caregiver education and instruction: exercises  Are their any abuse concerns? Patient is not abused  Are there any suicide concerns? no The patient has been screened to be at  NO risk level and appropriate recommendations have been made.    Plan:  Treatment Plan may include any combination of the following:  Evaluation  97162 - MEDIUM  Re-evaluation (97164)  Hot/cold pack (97010  Unattended electrical stimulation (97014)  Ultrasound (78478)   Manual Therapy (41282)  Therapeutic Exercise (08138)  Therapeutic Activities [phone])  Neuromuscular Re-education [phone])  Gait Training [phone])  Frequency / Duration: Patient to be seen 3 times per week for 6 weeks:      Therapist Signature: Denyse Amass Date: 09/28/2018   Certification Period: 6 weeks - 11/09/18 Time: 7:51 AM       I certify that the above Therapy Services are being furnished while the patient is under my care. I agree with the treatment plan and certify that this therapy is necessary.  Y or N I have read the above and request that my patient continue as recommended.  Y or N I have read the above report and request that my patient continue therapy with the following changes/special instructions  Y or N I have read the above report and request that my patient be discharged from therapy    Physician's Signature:____________________  Date:________________  Please sign and FAX back to appropriate hospital.

## 2018-09-28 NOTE — Progress Notes (Addendum)
Physical Therapy Evaluation - Knee  [name]     Date of Injury/surgery: 09/08/18    Chief Complaint/Mechanism of Injury: Pt presents to physical therapy s/p L TKR on 09/08/18. He states his knee is very stiff, hard to move, and painful. He went right home after the hospital and had home PT until last week. He still tries to do the exercises home PT gave him, but it's very painful and he doesn't like to push it. He takes oxycodone and tylenol as needed. He is also using ice, but sometimes feels like that makes it feel worse. He reports restless leg syndrome - taking muscle relaxer and gabapentin, but has gotten worse since surgery. Hx of torn meniscus surgeries bilaterally.    Past Medical History:   Diagnosis Date   ??? Arthritis    ??? CAD (coronary artery disease)     cholesterol   ??? Chronic kidney disease     stones   ??? Enlarged prostate    ??? Nausea & vomiting      Past Surgical History:   Procedure Laterality Date   ??? HX KNEE ARTHROSCOPY Bilateral     torn meniscus   ??? HX ORTHOPAEDIC Bilateral     Carpal tunnell   ??? HX ORTHOPAEDIC Bilateral     heel spurs   ??? HX OTHER SURGICAL  1990    hemmoroidectomy   ??? HX UROLOGICAL       Current Outpatient Medications on File Prior to Encounter   Medication Sig Dispense Refill   ??? cyclobenzaprine (FLEXERIL) 10 mg tablet Take 1 Tab by mouth three (3) times daily as needed for Muscle Spasm(s).     ??? oxyCODONE ER (OXYCONTIN) 10 mg ER tablet Take 1 Tab by mouth every twelve (12) hours.     ??? oxyCODONE IR (ROXICODONE) 10 mg tab immediate release tablet Take 1 Tab by mouth every four (4) hours as needed for Pain.     ??? docusate sodium (COLACE) 100 mg capsule Take 1 Cap by mouth two (2) times a day for 90 days. 60 Cap 2   ??? aspirin delayed-release 325 mg tablet Take 1 Tab by mouth two (2) times a day. 60 Tab 0   ??? acetaminophen (TYLENOL) 325 mg tablet Take 2 Tabs by mouth every four (4) hours as needed for Pain. Indications: pain associated with arthritis      ??? psyllium (METAMUCIL) packet Take 1 Packet by mouth daily.     ??? dutasteride (AVODART) 0.5 mg capsule Take 1 Tab by mouth daily.     ??? rOPINIRole (REQUIP) 1 mg tablet Take 1.5 Tabs by mouth daily.     ??? atorvastatin (LIPITOR) 40 mg tablet Take 1 Tab by mouth daily.     ??? folic acid/multivit-min/lutein (CENTRUM SILVER PO) Take 1 Tab by mouth daily.     ??? Cholecalciferol, Vitamin D3, 3,000 unit tab Take 1 Tab by mouth daily.     ??? levothyroxine (SYNTHROID) 50 mcg tablet Take 1 Tab by mouth Daily (before breakfast).     ??? tamsulosin (FLOMAX) 0.4 mg capsule Take 1 Tab by mouth daily.         No current facility-administered medications on file prior to encounter.        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 Increased antalgia without AD    Girth Measurements:        joint line  10 cm above jt line  10 cm below jt line   Left 37.6 cm 37.4 cm 31.6 cm     ROM / Strength  []  Unable to assess                  AROM                      PROM                   Strength (1-5)    Left Right Left Right Left Right   Hip Flexion     4 4    Abduction     4 4    Adduction     4+ 4+   Knee Flexion 72 132 75  3- 5    Extension -3 0   3- 5    Ankle Plantarflexion     5 5    Dorsiflexion     4 5    Inversion     4+ 5    Eversion     4+ 5                                            []  all WNLs/WFLs     []  all WNLs/WFLs       []  all at least 4/5    Palpation: moderate tenderness t/o knee. Steri-strips still present. Moderate pitting edema noted t/o knee and lower leg. Redness t/o knee; moderate warmth to the touch.    Other tests/comments:  Rhomberg EC: 30 sec    Tandem L: 30 sec, mod sway  Tandem R: 30 sec, min sway    SLS L: 30 sec, mod sway, pain  SLS R: 30 sec, min sway    Cara Thaxton L Ta Fair     09/28/2018

## 2018-09-28 NOTE — Progress Notes (Signed)
 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     Measures taken:       Medical intake forms       LEFS score 9/80; 89% limitation     Girth Measurements:        joint line  10 cm above jt line  10 cm below jt line   Left 37.6 cm 37.4 cm 31.6 cm     ROM / Strength  [] ? Unable to assess                  AROM                      PROM                   Strength (1-5)    Left Right Left Right Left Right   Hip Flexion     4 4    Abduction     4 4    Adduction     4+ 4+   Knee Flexion 72 132 75  3- 5    Extension -3 0   3- 5   Ankle Plantarflexion     5 5    Dorsiflexion     4 5    Inversion     4+ 5    Eversion     4+ 5                                            [] ? all WNLs/WFLs     [] ? all WNLs/WFLs       [] ? all at least 4/5    Rhomberg EC: 30 sec    Tandem L: 30 sec, mod sway  Tandem R: 30 sec, min sway    SLS L: 30 sec, mod sway, pain  SLS R: 30 sec, min sway    Assessment:   Pt is a 68 year old Male who presents with limited mobility s/p L TKR on 09/08/18. He demonstrates significant limitation in knee flexion. Moderate pitting edema t/o knee and lower leg. Pt educated in HEP focusing on bending his knee as well as elevation to help improve swelling. He  will benefit from skilled physical therapy to improve pain, increase ROM and strength, and improve gait and balance in order to return to PLOF, including walking, stairs, standing for long periods of time, driving, and working in Aeronautical [employer].    Problem List:   pain affecting function, decrease ROM, decrease strength, edema affecting function, impaired gait/ balance, decrease ADL/ functional  abilitiies, decrease activity tolerance and decrease flexibility/ joint mobility     Patient Goal (s): walk without pain  Rehabilitation Potential: good    Short Term Goals: To be accomplished in 3 weeks:  1)      The patient will demonstrate independence with HEP.  2)      The patient will ambulate with proper mechanics during stance phase of gait with full terminal hip/knee extension and 4/5 strength >200 ft.  Long Term Goals: To be accomplished in 6 weeks:   1)      The patient will be able to return to all household and full work activities without pain as demonstrated by LEFS>60.  2)      The patient will ascend/descend 1 flight of stairs with reciprocal pattern secondary to full LE ROM and 5/5 strength.  3)      The patient will be able to stand for 60 minutes without pain secondary to strength 5/5 in order to perform woodworking.     Persons(s) to be included in education:   patient (P)  Patient / Family readiness to learn indicated by: asking questions and interest  Barriers to Learning/Limitations: None  Patient/ Caregiver education and instruction: exercises  Are their any abuse concerns? Patient is not abused  Are there any suicide concerns? no The patient has been screened to be at  NO risk level and appropriate recommendations have been made.    Plan:  Treatment Plan may include any combination of the following:  Evaluation  97162 - MEDIUM  Re-evaluation (97164)  Hot/cold pack (97010  Unattended electrical stimulation (97014)  Ultrasound (02964)  Manual Therapy (02859)  Therapeutic Exercise (02889)  Therapeutic  Activities [phone])  Neuromuscular Re-education [phone])  Gait Training [phone])  Frequency / Duration: Patient to be seen 3 times per week for 6 weeks:      Therapist Signature: Burnard LITTIE Bottcher Date: 09/28/2018   Certification Period: 6 weeks - 11/09/18 Time: 7:51 AM       I certify that the above Therapy Services are being furnished while the patient is under my care. I agree with the treatment plan and certify that this therapy is necessary.  Y or N I have read the above and request that my patient continue as recommended.  Y or N I have read the above report and request that my patient continue therapy with the following changes/special instructions  Y or N I have read the above report and request that my patient be discharged from therapy    Physician's Signature:____________________  Date:________________  Please sign and FAX back to appropriate hospital.

## 2018-09-28 NOTE — Progress Notes (Signed)
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      OBJECTIVE    []  Patient is post-surgical and on a strict surgical protocol that must be followed per MD instructions and to benefit the patient's successful recovery, so treatment started today    Modality: [x]  see flow sheet      Therapeutic Activities: [x]  see flow sheet     []  Other:_  Manual Therapy:  []  see flow sheet     []  Other:_    Patient Education: [x]  Review HEP    []  Progressed/Changed HEP  []  positioning   []  posture/body mechanics   []  transfers   []  heat/ice application    Other Objective/Functional Measures:  [x]  see evaluation/re-evaluation/progress         ASSESSMENT  [x]   See evaluation/progress note/reevaluation  Educated pt on elevation to help improve swelling. Provided printout of exercises below for home.     Short Term Goals: To be accomplished in 3 weeks:  1)      The patient will demonstrate independence with HEP.  2)      The patient will ambulate with proper mechanics during stance phase of gait with full terminal hip/knee extension and 4/5 strength >200 ft.  Long Term Goals: To be accomplished in 6 weeks:   1)      The patient will be able to return to all household and full work activities without pain as demonstrated by LEFS>60.  2)       The patient will ascend/descend 1 flight of stairs with reciprocal pattern secondary to full LE ROM and 5/5 strength.  3)      The patient will be able to stand for 60 minutes without pain secondary to strength 5/5 in order to perform woodworking.   []   Patient will continue to benefit from skilled therapy to address remaining goals     PLAN  [x]   Upgrade activities as tolerated      [x]  Frequency/duration: 3x/week for 6 weeks  POC ends (date): 11/09/18  []   Continue plan of care    []   Discharge due to:_         Tresa Endo L Raquet  09/28/2018  Lower Extremity Therapy Flow Sheet  Joel Graham     Classify Intervention   1 2 3 4 5 6 7 8 9 10    Date:  09/28/18            Non timed procedure              Evaluation/re-eval/  D/C  IE            Moist heat/cold pack   MH/CP MH x10' supine            Electrical Stimulation   ES             Timed procedure                STM/MFR MT p scar (once steri strips off), retro for swelling            Joint mobilization MT p patella            Manual stretching/PassiveROM MT p flex, ext            Ultrasound [place]             Exercise/Activity              Quad set TE 20x5"            Heel slides TE x20 with SOS            Hip add ball/ Hip abd Tband TE p            Bridges TE p            March>SLR TE p            SAQ/LAQ TE x20 ea            Sidelying hip abd/clams TE p            Prone ham curls TE p            Strap Hamstring/Quad stretch TE p            Hip Stretches TE p stair stretches flex, ext            Standing alternating-  marches, ham curls, hip abd, hip ext TA p            Minisquats/TR/HR TA p            Tandem>SLS NMR p            Wobbleboard/BAPS NMR             TKE TA p            Sidestepping TA p            Forward step ups/Lateral Step ups TA p            Forward step downs>dips TA p            Cable column/Totalgym TE             LegPress TE             Recumbent bike/elliptical TE p             Treadmill/Elliptical TE                           Gait training GT             HEP updated  As above; printout provided    Educated on swelling  and elevation            Gait training 16109 GT              Moist Heat/Cold Pack 97010   EStim 97014     MH/CP    ES             Manual Therapy 97140 MT             Ultrasound 97035  [place]             Therapeutic exercise 97110 TE 25            Therapeutic Activity 97530 TA             Neuromuscular Reeducation 97112  NMR             TOTAL TREATMENT TIME  (untimed+timed minutes)  55            TIMED CODE TREATMENT  (timed code minutes-  shared minutes)  25            ** Medicare-add KX at 5th visit if PT&ST/10h visit if PT only**           Add KX    Therapist's Initials  KR

## 2018-09-28 NOTE — Progress Notes (Signed)
 Physical Therapy Evaluation - Knee  Joel Graham     Date of Injury/surgery: 09/08/18    Chief Complaint/Mechanism of Injury: Pt presents to physical therapy s/p L TKR on 09/08/18. He states his knee is very stiff, hard to move, and painful. He went right home after the hospital and had home PT until last week. He still tries to do the exercises home PT gave him, but it's very painful and he doesn't like to push it. He takes oxycodone and tylenol as needed. He is also using ice, but sometimes feels like that makes it feel worse. He reports restless leg syndrome - taking muscle relaxer and gabapentin, but has gotten worse since surgery. Hx of torn meniscus surgeries bilaterally.    Past Medical History:   Diagnosis Date   . Arthritis    . CAD (coronary artery disease)     cholesterol   . Chronic kidney disease     stones   . Enlarged prostate    . Nausea & vomiting      Past Surgical History:   Procedure Laterality Date   . HX KNEE ARTHROSCOPY Bilateral     torn meniscus   . HX ORTHOPAEDIC Bilateral     Carpal tunnell   . HX ORTHOPAEDIC Bilateral     heel spurs   . HX OTHER SURGICAL  1990    hemmoroidectomy   . HX UROLOGICAL       Current Outpatient Medications on File Prior to Encounter   Medication Sig Dispense Refill   . cyclobenzaprine (FLEXERIL) 10 mg tablet Take 1 Tab by mouth three (3) times daily as needed for Muscle Spasm(s).     SABRA oxyCODONE ER (OXYCONTIN) 10 mg ER tablet Take 1 Tab by mouth every twelve (12) hours.     SABRA oxyCODONE IR (ROXICODONE) 10 mg tab immediate release tablet Take 1 Tab by mouth every four (4) hours as needed for Pain.     . docusate sodium (COLACE) 100 mg capsule Take 1 Cap by mouth two (2) times a day for 90 days. 60 Cap 2   . aspirin delayed-release 325 mg tablet Take 1 Tab by mouth two (2) times a day. 60 Tab 0   . acetaminophen (TYLENOL) 325 mg tablet Take 2 Tabs by mouth every four (4) hours as needed for Pain. Indications: pain associated with arthritis     . psyllium  (METAMUCIL) packet Take 1 Packet by mouth daily.     SABRA dutasteride (AVODART) 0.5 mg capsule Take 1 Tab by mouth daily.     SABRA rOPINIRole (REQUIP) 1 mg tablet Take 1.5 Tabs by mouth daily.     SABRA atorvastatin (LIPITOR) 40 mg tablet Take 1 Tab by mouth daily.     . folic acid/multivit-min/lutein (CENTRUM SILVER PO) Take 1 Tab by mouth daily.     . Cholecalciferol, Vitamin D3, 3,000 unit tab Take 1 Tab by mouth daily.     SABRA levothyroxine (SYNTHROID) 50 mcg tablet Take 1 Tab by mouth Daily (before breakfast).     . tamsulosin (FLOMAX) 0.4 mg capsule Take 1 Tab by mouth daily.         No current facility-administered medications on file prior to encounter.        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 Increased antalgia without AD    Girth Measurements:        joint line  10 cm above jt line  10 cm below jt line   Left 37.6 cm 37.4 cm 31.6 cm     ROM / Strength  []  Unable to assess                  AROM                      PROM                   Strength (1-5)    Left Right Left Right Left Right   Hip Flexion     4 4    Abduction     4 4    Adduction     4+ 4+   Knee Flexion 72 132 75  3- 5    Extension -3 0   3- 5   Ankle Plantarflexion     5 5     Dorsiflexion     4 5    Inversion     4+ 5    Eversion     4+ 5                                            []  all WNLs/WFLs     []  all WNLs/WFLs       []  all at least 4/5    Palpation: moderate tenderness t/o knee. Steri-strips still present. Moderate pitting edema noted t/o knee and lower leg. Redness t/o knee; moderate warmth to the touch.    Other tests/comments:  Rhomberg EC: 30 sec    Tandem L: 30 sec, mod sway  Tandem R: 30 sec, min sway    SLS L: 30 sec, mod sway, pain  SLS R: 30 sec, min sway    Kelly L Raquet     09/28/2018

## 2018-09-30 ENCOUNTER — Inpatient Hospital Stay: Admit: 2018-09-30 | Payer: PRIVATE HEALTH INSURANCE | Primary: Family Medicine

## 2018-09-30 NOTE — Progress Notes (Signed)
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]   Referral Source: Joycie PeekJuliano, John S., MD     Date:09/30/2018  Visit #: 2     Treatment Area: L TKR  Date of Surgery: 09/08/18  Protocol  []  Yes  Precautions/Restrictions:  Patients prefers to be treated behind curtain when appropriate:  []  Yes   Patient has therapist preference:   []  Male         []  Male         SUBJECTIVE  Pain Level (0-10 scale):               Pain Level (0-10 scale) post treatment:   Medication Changes: []  No    []  Yes (see paper medication log)  Subjective functional status/changes:   []  No changes reported    Pt states he just got out of bed so his knee feels alright.     OBJECTIVE    []  Patient is post-surgical and on a strict surgical protocol that must be followed per MD instructions and to benefit the patient's successful recovery, so treatment started today    Modality: [x]  see flow sheet      Therapeutic Activities: [x]  see flow sheet     []  Other:_  Manual Therapy:  []  see flow sheet     []  Other:_    Patient Education: [x]  Review HEP    []  Progressed/Changed HEP  []  positioning   []  posture/body mechanics   []  transfers   []  heat/ice application    Other Objective/Functional Measures:  []  see evaluation/re-evaluation/progress         ASSESSMENT  []   See evaluation/progress note/reevaluation  Pt with severe pain during PROM flex however understands the importance of achieving at least 90 deg flex in order to avoid manipulation. After significant stretching into flexion, pt able to achieve 90 deg with significant pain. PT emphasized importance of stretching at home to maintain gains made in each session. Significant improvement in pitting edema noted compared to IE. Ended session with heat because pt stated his  knee felt tight. Recommended pt use heat prior to stretching at home in order to help keep knee loose.    Short Term Goals: To be accomplished in 3 weeks:  1)      The patient will demonstrate independence with HEP.  2)      The patient will ambulate with proper mechanics during stance phase of gait with full terminal hip/knee extension and 4/5 strength >200 ft.  Long Term Goals: To be accomplished in 6 weeks:   1)      The patient will be able to return to all household and full work activities without pain as demonstrated by LEFS>60.  2)      The patient will ascend/descend 1 flight of stairs with reciprocal pattern secondary to full LE ROM and 5/5 strength.  3)      The patient will be able to stand for 60 minutes without pain secondary to strength 5/5 in order to perform woodworking.   []   Patient will continue to benefit from skilled therapy to address remaining goals     PLAN  [x]   Upgrade activities as tolerated      [x]  Frequency/duration: 3x/week for 6 weeks  POC ends (date): 11/09/18  []   Continue plan of care    []   Discharge due to:_         Denyse Amass  09/30/2018                                                                      Lower Extremity Therapy Flow Sheet  Joel Breed     Classify Intervention   1 2 3 4 5 6 7 8 9 10    Date:  09/28/18 09/30/18           Non timed procedure              Evaluation/re-eval/  D/C  IE            Moist heat/cold pack   MH/CP MH x10' supine BOS and EOS           Electrical Stimulation   ES             Timed procedure                STM/MFR MT p scar (once steri strips off), retro for swelling Retro knee for swelling           Joint mobilization MT p patella x           Manual stretching/PassiveROM MT p flex, ext x           Ultrasound [place]             Exercise/Activity              Quad set TE 20x5" x           Heel slides TE x20 with SOS x10           Hip add ball/ Hip abd Tband TE p Ball squeeze 20x5"    UBKF green x20           Bridges TE p             March>SLR TE p            SAQ/LAQ TE x20 ea xx           Sidelying hip abd/clams TE p            Prone ham curls TE p            Strap Hamstring/Quad stretch TE p            Hip Stretches TE p stair stretches flex, ext            Standing alternating-  marches, ham curls, hip abd, hip ext TA p            Minisquats/TR/HR TA p            Tandem>SLS NMR p            Wobbleboard/BAPS NMR             TKE TA p            Sidestepping TA p            Forward step ups/Lateral Step ups TA p            Forward step downs>dips TA p            Cable column/Totalgym  TE             LegPress TE             Recumbent bike/elliptical TE p RB seat 12 rocking x8'           Treadmill/Elliptical TE                           Gait training GT             HEP updated  As above; printout provided    Educated on swelling and elevation            Gait training 93968 GT              Moist Heat/Cold Pack 97010   EStim 97014     MH/CP    ES  20           Manual Therapy 97140 MT  20           Ultrasound 97035  [place]             Therapeutic exercise 97110 TE 25 40           Therapeutic Activity 97530 TA             Neuromuscular Reeducation 97112  NMR             TOTAL TREATMENT TIME  (untimed+timed minutes)  55 80           TIMED CODE TREATMENT  (timed code minutes-  shared minutes)  25 60           ** Medicare-add KX at 5th visit if PT&ST/10h visit if PT only**           Add KX    Therapist's Initials  KR KR

## 2018-09-30 NOTE — Progress Notes (Signed)
 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]   Referral Source: Juliano, John S., MD     Date:09/30/2018  Visit #: 2     Treatment Area: L TKR  Date of Surgery: 09/08/18  Protocol  []  Yes  Precautions/Restrictions:  Patients prefers to be treated behind curtain when appropriate:  []  Yes   Patient has therapist preference:   []  Male         []  Male         SUBJECTIVE  Pain Level (0-10 scale):               Pain Level (0-10 scale) post treatment:   Medication Changes: []  No    []  Yes (see paper medication log)  Subjective functional status/changes:   []  No changes reported    Pt states he just got out of bed so his knee feels alright.     OBJECTIVE    []  Patient is post-surgical and on a strict surgical protocol that must be followed per MD instructions and to benefit the patient's successful recovery, so treatment started today    Modality: [x]  see flow sheet      Therapeutic Activities: [x]  see flow sheet     []  Other:_  Manual Therapy:  []  see flow sheet     []  Other:_    Patient Education: [x]  Review HEP    []  Progressed/Changed HEP  []  positioning   []  posture/body mechanics   []  transfers   []  heat/ice application    Other Objective/Functional Measures:  []  see evaluation/re-evaluation/progress         ASSESSMENT  []   See evaluation/progress note/reevaluation  Pt with severe pain during PROM flex however understands the importance of achieving at least 90 deg flex in order to avoid manipulation. After significant stretching into flexion, pt able to achieve 90 deg with significant pain. PT emphasized importance of stretching at home to maintain gains made in each session. Significant improvement in pitting edema noted compared to IE. Ended session with heat because pt stated his knee felt tight. Recommended pt use heat prior to stretching at  home in order to help keep knee loose.    Short Term Goals: To be accomplished in 3 weeks:  1)      The patient will demonstrate independence with HEP.  2)      The patient will ambulate with proper mechanics during stance phase of gait with full terminal hip/knee extension and 4/5 strength >200 ft.  Long Term Goals: To be accomplished in 6 weeks:   1)      The patient will be able to return to all household and full work activities without pain as demonstrated by LEFS>60.  2)      The patient will ascend/descend 1 flight of stairs with reciprocal pattern secondary to full LE ROM and 5/5 strength.  3)      The patient will be able to stand for 60 minutes without pain secondary to strength 5/5 in order to perform woodworking.   []   Patient will continue to benefit from skilled therapy to address remaining goals     PLAN  [x]   Upgrade activities as tolerated      [x]  Frequency/duration: 3x/week for 6 weeks  POC ends (date): 11/09/18  []   Continue plan of care    []   Discharge due to:_         Burnard LITTIE Bottcher  09/30/2018                                                                      Lower Extremity Therapy Flow Sheet  Joel Graham     Classify Intervention   1 2 3 4 5 6 7 8 9 10    Date:  09/28/18 09/30/18           Non timed procedure              Evaluation/re-eval/  D/C  IE            Moist heat/cold pack   MH/CP MH x10' supine BOS and EOS           Electrical Stimulation   ES             Timed procedure                STM/MFR MT p scar (once steri strips off), retro for swelling Retro knee for swelling           Joint mobilization MT p patella x           Manual stretching/PassiveROM MT p flex, ext x           Ultrasound US              Exercise/Activity              Quad set TE 20x5 x           Heel slides TE x20 with SOS x10           Hip add ball/ Hip abd Tband TE p Ball squeeze 20x5    UBKF green x20           Bridges TE p            March>SLR TE p            SAQ/LAQ TE x20 ea xx           Sidelying hip  abd/clams TE p            Prone ham curls TE p            Strap Hamstring/Quad stretch TE p            Hip Stretches TE p stair stretches flex, ext            Standing alternating-  marches, ham curls, hip abd, hip ext TA p            Minisquats/TR/HR TA p            Tandem>SLS NMR p            Wobbleboard/BAPS NMR             TKE TA p            Sidestepping TA p            Forward step ups/Lateral Step ups TA p            Forward step downs>dips TA p            Cable column/Totalgym  TE             LegPress TE             Recumbent bike/elliptical TE p RB seat 12 rocking x8'           Treadmill/Elliptical TE                           Gait training GT             HEP updated  As above; printout provided    Educated on swelling and elevation            Gait training 02883 GT              Moist Heat/Cold Pack 97010   EStim 97014     MH/CP    ES  20           Manual Therapy 97140 MT  20           Ultrasound 97035  [place]              Therapeutic exercise 97110 TE 25 40           Therapeutic Activity 97530 TA             Neuromuscular Reeducation 97112  NMR             TOTAL TREATMENT TIME  (untimed+timed minutes)  55 80           TIMED CODE TREATMENT  (timed code minutes-  shared minutes)  25 60           ** Medicare-add KX at 5th visit if PT&ST/10h visit if PT only**           Add KX    Therapist's Initials  KR KR

## 2018-10-02 ENCOUNTER — Inpatient Hospital Stay: Admit: 2018-10-02 | Payer: PRIVATE HEALTH INSURANCE | Primary: Family Medicine

## 2018-10-02 NOTE — Progress Notes (Signed)
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]   Referral Source: Joycie PeekJuliano, John S., MD     Date:10/02/2018  Visit #: 3     Treatment Area: L TKR  Date of Surgery: 09/08/18  Protocol  []  Yes  Precautions/Restrictions:  Patients prefers to be treated behind curtain when appropriate:  []  Yes   Patient has therapist preference:   []  Male         []  Male         SUBJECTIVE  Pain Level (0-10 scale):               Pain Level (0-10 scale) post treatment:   Medication Changes: []  No    []  Yes (see paper medication log)  Subjective functional status/changes:   []  No changes reported    Pt states he just got out of bed so his knee feels alright.  He is stretching it on a stool to bend it twice a day.  He also is bending it with a strap twice a day.     OBJECTIVE    []  Patient is post-surgical and on a strict surgical protocol that must be followed per MD instructions and to benefit the patient's successful recovery, so treatment started today    Modality: [x]  see flow sheet      Therapeutic Activities: [x]  see flow sheet     []  Other:_  Manual Therapy:  []  see flow sheet     []  Other:_    Patient Education: [x]  Review HEP    []  Progressed/Changed HEP  []  positioning   []  posture/body mechanics   []  transfers   []  heat/ice application    Other Objective/Functional Measures:  []  see evaluation/re-evaluation/progress         ASSESSMENT  []   See evaluation/progress note/reevaluation  Pt able to bend knee to 92 degrees with strap today during heel slides.  Also able to make full revolution on bike going backwards, though painful.  Progressed strengthening further and added knee extension and flexion stretches on step. PT emphasized importance of bending knee frequently throughout the day to facilitate ROM gains.    Short Term Goals: To be accomplished in 3 weeks:   1)      The patient will demonstrate independence with HEP.  2)      The patient will ambulate with proper mechanics during stance phase of gait with full terminal hip/knee extension and 4/5 strength >200 ft.  Long Term Goals: To be accomplished in 6 weeks:   1)      The patient will be able to return to all household and full work activities without pain as demonstrated by LEFS>60.  2)      The patient will ascend/descend 1 flight of stairs with reciprocal pattern secondary to full LE ROM and 5/5 strength.  3)      The patient will be able to stand for 60 minutes without pain secondary to strength 5/5 in order to perform woodworking.   []   Patient will continue to benefit from skilled therapy to address remaining goals     PLAN  [x]   Upgrade activities as tolerated      [x]  Frequency/duration: 3x/week for 6 weeks  POC ends (date): 11/09/18  []   Continue plan of care    []   Discharge due to:_  [name]  10/02/2018                                                                      Lower Extremity Therapy Flow Sheet  Rafik Gilley     Classify Intervention   1 2 3 4 5 6 7 8 9 10    Date:  09/28/18 09/30/18 10/02/2018          Non timed procedure              Evaluation/re-eval/  D/C  IE            Moist heat/cold pack   MH/CP MH x10' supine BOS and EOS  bolster under knee BOS xx          Electrical Stimulation   ES             Timed procedure                STM/MFR MT p scar (once steri strips off), retro for swelling Retro knee for swelling X plus quad in sitting          Joint mobilization MT p patella x x          Manual stretching/PassiveROM MT p flex, ext x X flex in sitting with tibial IR and distraction          Ultrasound [place]             Exercise/Activity              Quad set TE 20x5" x x          Heel slides TE x20 with SOS x10 x          Hip add ball/ Hip abd Tband TE p Ball squeeze 20x5"    UBKF green x20 xx          Bridges TE p            March>SLR TE p             SAQ/LAQ TE x20 ea xx xx          Sidelying hip abd/clams TE p            Prone ham curls TE p            Strap Hamstring/Quad stretch TE p            Stair Stretches TE p stair stretches flex, ext  10 x 5" each          Standing alternating-  marches, ham curls, hip abd, hip ext TA p            Minisquats/TR/HR TA p            Tandem>SLS NMR p            Wobbleboard/BAPS NMR             TKE TA p            Sidestepping TA p            Ascending/descending stairs reciprocally TA   4" x 5          Forward step ups/Lateral Step ups TA p  FSU  6" x 10          Forward step downs>dips TA p            Cable column/Totalgym TE             LegPress TE             Recumbent bike/elliptical TE p RB seat 12 rocking x8' X seat 11          Treadmill/Elliptical TE                           Gait training GT             HEP updated  As above; printout provided    Educated on swelling and elevation            Gait training 83291 GT              Moist Heat/Cold Pack 97010   EStim 97014     MH/CP    ES  20 20          Manual Therapy 97140 MT  20 15          Ultrasound 97035  [place]             Therapeutic exercise 97110 TE 25 40 38          Therapeutic Activity 97530 TA   15          Neuromuscular Reeducation 97112  NMR             TOTAL TREATMENT TIME  (untimed+timed minutes)  55 80 88          TIMED CODE TREATMENT  (timed code minutes-  shared minutes)  25 60 68          ** Medicare-add KX at 5th visit if PT&ST/10h visit if PT only**           Add KX    Therapist's Initials  KR KR AW

## 2018-10-02 NOTE — Progress Notes (Signed)
 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  He is stretching it on a stool to bend it twice a day.  He also is bending it with a strap twice a day.     OBJECTIVE    []  Patient is post-surgical and on a strict surgical protocol that must be followed per MD instructions and to benefit the patient's successful recovery, so treatment started today    Modality: [x]  see flow sheet      Therapeutic Activities: [x]  see flow sheet     []  Other:_  Manual Therapy:  []  see flow sheet     []  Other:_    Patient Education: [x]  Review HEP    []  Progressed/Changed HEP  []  positioning   []  posture/body mechanics   []  transfers   []  heat/ice application    Other Objective/Functional Measures:  []  see evaluation/re-evaluation/progress         ASSESSMENT  []   See evaluation/progress note/reevaluation  Pt able to bend knee to 92 degrees with strap today during heel slides.  Also able to make full revolution on bike going backwards, though painful.  Progressed strengthening further and added knee extension and flexion stretches on step. PT emphasized importance of bending knee frequently throughout the day to facilitate ROM gains.    Short Term Goals: To be accomplished in 3  weeks:  1)      The patient will demonstrate independence with HEP.  2)      The patient will ambulate with proper mechanics during stance phase of gait with full terminal hip/knee extension and 4/5 strength >200 ft.  Long Term Goals: To be accomplished in 6 weeks:   1)      The patient will be able to return to all household and full work activities without pain as demonstrated by LEFS>60.  2)      The patient will ascend/descend 1 flight of stairs with reciprocal pattern secondary to full LE ROM and 5/5 strength.  3)      The patient will be able to stand for 60 minutes without pain secondary to strength 5/5 in order to perform woodworking.   []   Patient will continue to benefit from skilled therapy to address remaining goals     PLAN  [x]   Upgrade activities as tolerated      [x]  Frequency/duration: 3x/week for 6 weeks  POC ends (date): 11/09/18  []   Continue plan of care    []   Discharge due to:_  Joel Graham  10/02/2018                                                                      Lower Extremity Therapy Flow Sheet  Joel Graham     Classify Intervention   1 2 3 4 5 6 7 8 9 10    Date:  09/28/18 09/30/18 10/02/2018          Non timed procedure              Evaluation/re-eval/  D/C  IE            Moist heat/cold pack   MH/CP MH x10' supine BOS and EOS  bolster under knee BOS xx          Electrical Stimulation   ES             Timed procedure                STM/MFR MT p scar (once steri strips off), retro for swelling Retro knee for swelling X plus quad in sitting          Joint mobilization MT p patella x x          Manual stretching/PassiveROM MT p flex, ext x X flex in sitting with tibial IR and distraction          Ultrasound US              Exercise/Activity              Quad set TE 20x5 x x          Heel slides TE x20 with SOS x10 x          Hip add ball/ Hip abd Tband TE p Ball squeeze 20x5    UBKF green x20 xx          Bridges TE p            March>SLR TE p            SAQ/LAQ TE x20 ea xx xx           Sidelying hip abd/clams TE p            Prone ham curls TE p            Strap Hamstring/Quad stretch TE p            Stair Stretches TE p stair stretches flex, ext  10 x 5 each          Standing alternating-  marches, ham curls, hip abd, hip ext TA p            Minisquats/TR/HR TA p            Tandem>SLS NMR p            Wobbleboard/BAPS NMR             TKE TA p            Sidestepping TA p            Ascending/descending stairs reciprocally TA   4 x 5          Forward step ups/Lateral Step ups TA p  FSU  6 x 10          Forward step downs>dips TA p            Cable column/Totalgym TE             LegPress TE             Recumbent bike/elliptical TE p RB seat 12 rocking x8' X seat 11          Treadmill/Elliptical TE                           Gait training GT             HEP updated  As above; printout provided    Educated on swelling and elevation            Gait training 02883 GT              Moist Heat/Cold Pack 97010   EStim 97014     MH/CP    ES  20 20          Manual Therapy 97140 MT  20 15          Ultrasound 97035  US              Therapeutic exercise 97110 TE 25 40 38          Therapeutic Activity 97530 TA   15          Neuromuscular Reeducation 97112  NMR             TOTAL TREATMENT TIME  (untimed+timed minutes)  55 80 88          TIMED CODE TREATMENT  (timed code minutes-  shared minutes)  25 60 68          ** Medicare-add KX at 5th visit if PT&ST/10h visit if PT only**           Add KX    Therapist's Initials  KR KR AW

## 2018-10-04 ENCOUNTER — Inpatient Hospital Stay: Admit: 2018-10-04 | Payer: PRIVATE HEALTH INSURANCE | Primary: Family Medicine

## 2018-10-04 NOTE — Progress Notes (Signed)
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  He got stiff later as usual.  States he is leaving tomorrow for [place] and comes back Sunday.     OBJECTIVE    []  Patient is post-surgical and on a strict surgical protocol that must be followed per MD instructions and to benefit the patient's successful recovery, so treatment started today    Modality: [x]  see flow sheet      Therapeutic Activities: [x]  see flow sheet     []  Other:_  Manual Therapy:  []  see flow sheet     []  Other:_    Patient Education: [x]  Review HEP    []  Progressed/Changed HEP  []  positioning   []  posture/body mechanics   []  transfers   []  heat/ice application    Other Objective/Functional Measures:  []  see evaluation/re-evaluation/progress         ASSESSMENT  []   See evaluation/progress note/reevaluation    Pt able to bend knee to 93 degrees passively.  Progressed strengthening further.   Recommended patient move his LE as much as possible while on the flight, to perform marching. LAQ, toe squeezes, and ankle pumps while seated and to walk around the airport prior to the flight.  Recommended patient keep going with his exercises while away and to focus on knee  flexion, pt agreed.  Patient able to move full revolutions on bike going forward today.    Short Term Goals: To be accomplished in 3 weeks:  1)      The patient will demonstrate independence with HEP.  2)      The patient will ambulate with proper mechanics during stance phase of gait with full terminal hip/knee extension and 4/5 strength >200 ft.  Long Term Goals: To be accomplished in 6 weeks:   1)      The patient will be able to return to all household and full work activities without pain as demonstrated by LEFS>60.  2)      The patient will ascend/descend 1 flight of stairs with reciprocal pattern secondary to full LE ROM and 5/5 strength.  3)      The patient will be able to stand for 60 minutes without pain secondary to strength 5/5 in order to perform woodworking.   []   Patient will continue to benefit from skilled therapy to address remaining goals     PLAN  [x]   Upgrade activities as tolerated      [x]  Frequency/duration: 3x/week for 6 weeks  POC ends (date):  11/09/18  [x]   Continue plan of care    []   Discharge due to:_         Vira Blancolexis N Annslee Tercero  10/04/2018                                                                      Lower Extremity Therapy Flow Sheet  Park BreedWilliam Siegmann     Classify Intervention   1 2 3 4 5 6 7 8 9 10    Date:  09/28/18 09/30/18 10/02/2018 10/04/2018         Non timed procedure              Evaluation/re-eval/  D/C  IE            Moist heat/cold pack   MH/CP MH x10' supine BOS and EOS  bolster under knee BOS xx xx         Electrical Stimulation   ES             Timed procedure                STM/MFR MT p scar (once steri strips off), retro for swelling Retro knee for swelling X plus quad in sitting x         Joint mobilization MT p patella x x x         Manual stretching/PassiveROM MT p flex, ext x X flex in sitting with tibial IR and distraction x         Ultrasound US             Exercise/Activity              Quad set TE 20x5" x x x         Heel slides TE x20 with SOS x10 x X 20           Hip add ball/ Hip abd Tband TE p Ball squeeze 20x5"    UBKF green x20 xx DC ball sq        x         Bridges with ball squeeze TE p   X 20         March>SLR TE p            SAQ/LAQ TE x20 ea xx xx X LAQ         Sidelying hip abd/clams TE p            Prone ham curls TE p            Strap Hamstring/Quad stretch TE p   2 x 30" HS         Stair Stretches TE p stair stretches flex, ext  10 x 5" each x         Standing alternating-  marches, ham curls, hip abd, hip ext TA p            Minisquats/HR TA p   X 20          Tandem>SLS NMR p            Wobbleboard/BAPS NMR             TKE TA p  Sidestepping TA p            Ascending/descending stairs reciprocally TA   4" x 5          Forward step ups/Lateral Step ups TA p  FSU 6" x 10   FSU 6" x 10  LSU 6" x 10         Forward step downs>dips TA p            Cable column/Totalgym TE             LegPress TE             Recumbent bike/elliptical TE p RB seat 12 rocking x8' X seat 11 x         Treadmill/Elliptical TE                           Gait training GT             HEP updated  As above; printout provided    Educated on swelling and elevation            Gait training 97116 GT              Moist Heat/Cold Pack 97010   EStim 97014     MH/CP    ES  20 20 20          Manual Therapy 97140 MT  20 15 15          Ultrasound 97035  [place]             Therapeutic exercise 97110 TE 25 40 38 30         Therapeutic Activity 97530 TA   15 15         Neuromuscular Reeducation 97112  NMR             TOTAL TREATMENT TIME  (untimed+timed minutes)  55 80 88 90         TIMED CODE TREATMENT  (timed code minutes-  shared minutes)  25 60 68 60         ** Medicare-add KX at 5th visit if PT&ST/10h visit if PT only**           Add KX    Therapist's Initials  KR KR AW AW

## 2018-10-04 NOTE — Progress Notes (Signed)
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  He got stiff later as usual.  States he is leaving tomorrow for [place] and comes back Sunday.     OBJECTIVE    []  Patient is post-surgical and on a strict surgical protocol that must be followed per MD instructions and to benefit the patient's successful recovery, so treatment started today    Modality: [x]  see flow sheet      Therapeutic Activities: [x]  see flow sheet     []  Other:_  Manual Therapy:  []  see flow sheet     []  Other:_    Patient Education: [x]  Review HEP    []  Progressed/Changed HEP  []  positioning   []  posture/body mechanics   []  transfers   []  heat/ice application    Other Objective/Functional Measures:  []  see evaluation/re-evaluation/progress         ASSESSMENT  []   See evaluation/progress note/reevaluation    Pt able to bend knee to 93 degrees passively.  Progressed strengthening further.   Recommended patient move his LE as much as possible while on the flight, to perform marching. LAQ, toe squeezes, and ankle pumps while seated and to walk around the airport prior to the flight.  Recommended patient keep going with his exercises while away and to focus on knee flexion, pt agreed.  Patient able to move full  revolutions on bike going forward today.    Short Term Goals: To be accomplished in 3 weeks:  1)      The patient will demonstrate independence with HEP.  2)      The patient will ambulate with proper mechanics during stance phase of gait with full terminal hip/knee extension and 4/5 strength >200 ft.  Long Term Goals: To be accomplished in 6 weeks:   1)      The patient will be able to return to all household and full work activities without pain as demonstrated by LEFS>60.  2)      The patient will ascend/descend 1 flight of stairs with reciprocal pattern secondary to full LE ROM and 5/5 strength.  3)      The patient will be able to stand for 60 minutes without pain secondary to strength 5/5 in order to perform woodworking.   []   Patient will continue to benefit from skilled therapy to address remaining goals     PLAN  [x]   Upgrade activities as tolerated      [x]  Frequency/duration: 3x/week for 6 weeks  POC ends (date):  11/09/18  [x]   Continue plan of care    []   Discharge due to:_         [name]  10/04/2018                                                                      Lower Extremity Therapy Flow Sheet  [name]     Classify Intervention   1 2 3 4 5 6 7 8 9 10    Date:  09/28/18 09/30/18 10/02/2018 10/04/2018         Non timed procedure              Evaluation/re-eval/  D/C  IE            Moist heat/cold pack   MH/CP MH x10' supine BOS and EOS  bolster under knee BOS xx xx         Electrical Stimulation   ES             Timed procedure                STM/MFR MT p scar (once steri strips off), retro for swelling Retro knee for swelling X plus quad in sitting x         Joint mobilization MT p patella x x x         Manual stretching/PassiveROM MT p flex, ext x X flex in sitting with tibial IR and distraction x         Ultrasound [place]             Exercise/Activity              Quad set TE 20x5" x x x         Heel slides TE x20 with SOS x10 x X 20          Hip add ball/ Hip abd Tband TE p Ball squeeze  20x5"    UBKF green x20 xx DC ball sq        x         Bridges with ball squeeze TE p   X 20         March>SLR TE p            SAQ/LAQ TE x20 ea xx xx X LAQ         Sidelying hip abd/clams TE p            Prone ham curls TE p            Strap Hamstring/Quad stretch TE p   2 x 30" HS         Stair Stretches TE p stair stretches flex, ext  10 x 5" each x         Standing alternating-  marches, ham curls, hip abd, hip ext TA p            Minisquats/HR TA p   X 20          Tandem>SLS NMR p            Wobbleboard/BAPS NMR             TKE TA p  Sidestepping TA p            Ascending/descending stairs reciprocally TA   4" x 5          Forward step ups/Lateral Step ups TA p  FSU 6" x 10   FSU 6" x 10  LSU 6" x 10         Forward step downs>dips TA p            Cable column/Totalgym TE             LegPress TE             Recumbent bike/elliptical TE p RB seat 12 rocking x8' X seat 11 x         Treadmill/Elliptical TE                           Gait training GT             HEP updated  As above; printout provided    Educated on swelling and elevation            Gait training 06301 GT              Moist Heat/Cold Pack 97010   EStim 97014     MH/CP    ES  20 20 20          Manual Therapy 97140 MT  20 15 15          Ultrasound 97035  [place]             Therapeutic exercise 97110 TE 25 40 38 30         Therapeutic Activity 97530 TA   15 15         Neuromuscular Reeducation 97112  NMR             TOTAL TREATMENT TIME  (untimed+timed minutes)  55 80 88 90         TIMED CODE TREATMENT  (timed code minutes-  shared minutes)  25 60 68 60         ** Medicare-add KX at 5th visit if PT&ST/10h visit if PT only**           Add KX    Therapist's Initials  KR KR AW AW

## 2018-10-06 ENCOUNTER — Encounter: Payer: PRIVATE HEALTH INSURANCE | Primary: Family Medicine

## 2018-10-08 ENCOUNTER — Encounter: Payer: PRIVATE HEALTH INSURANCE | Primary: Family Medicine

## 2018-10-11 ENCOUNTER — Inpatient Hospital Stay: Admit: 2018-10-11 | Payer: PRIVATE HEALTH INSURANCE | Primary: Family Medicine

## 2018-10-11 NOTE — Progress Notes (Signed)
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]   Referral Source: Joycie Peek., MD     Date:10/11/2018  Visit #: 5     Treatment Area: L TKR  Date of Surgery: 09/08/18  Protocol  []  Yes  Precautions/Restrictions:  Patients prefers to be treated behind curtain when appropriate:  []  Yes   Patient has therapist preference:   []  Male         []  Male         SUBJECTIVE  Pain Level (0-10 scale):               Pain Level (0-10 scale) post treatment:   Medication Changes: []  No    []  Yes (see paper medication log)  Subjective functional status/changes:   []  No changes reported    Pt returns after 1 week secondary to being in [place] last week for his sister's funeral. He states he did his exercises while away but not as often. Pt sees his doctor Friday.      OBJECTIVE    []  Patient is post-surgical and on a strict surgical protocol that must be followed per MD instructions and to benefit the patient's successful recovery, so treatment started today    Modality: [x]  see flow sheet      Therapeutic Activities: [x]  see flow sheet     []  Other:_  Manual Therapy:  []  see flow sheet     []  Other:_    Patient Education: [x]  Review HEP    []  Progressed/Changed HEP  []  positioning   []  posture/body mechanics   []  transfers   []  heat/ice application    Other Objective/Functional Measures:  []  see evaluation/re-evaluation/progress         ASSESSMENT  []   See evaluation/progress note/reevaluation  Pt with significant spasms in his thigh during treatment; he states these aren't getting any better. He declined heat at [facility] as he feels the heat makes the spasms worse. Able to complete full rev on RB without much difficulty.     Short Term Goals: To be accomplished in 3 weeks:  1)      The patient will demonstrate independence with HEP.   2)      The patient will ambulate with proper mechanics during stance phase of gait with full terminal hip/knee extension and 4/5 strength >200 ft.  Long Term Goals: To be accomplished in 6 weeks:   1)      The patient will be able to return to all household and full work activities without pain as demonstrated by LEFS>60.  2)      The patient will ascend/descend 1 flight of stairs with reciprocal pattern secondary to full LE ROM and 5/5 strength.  3)      The patient will be able to stand for 60 minutes without pain secondary to strength 5/5 in order to perform woodworking.   []   Patient will continue to benefit from skilled therapy to address remaining goals     PLAN  [x]   Upgrade activities as tolerated      [x]  Frequency/duration: 3x/week for 6 weeks  POC ends (date): 11/09/18  [x]   Continue plan of care    []   Discharge due to:_         Tresa Endo L Malaquias Lenker  10/11/2018  Lower Extremity Therapy Flow Sheet  [facility]     Classify Intervention   1 2 3 4 5 6 7 8 9 10    Date:  09/28/18 09/30/18 10/02/2018 10/04/2018 10/11/18        Non timed procedure              Evaluation/re-eval/  D/C  IE            Moist heat/cold pack   MH/CP MH x10' supine BOS and EOS  bolster under knee BOS xx xx X    Declined EOS        Electrical Stimulation   ES             Timed procedure                STM/MFR MT p scar (once steri strips off), retro for swelling Retro knee for swelling X plus quad in sitting x x        Joint mobilization MT p patella x x x x        Manual stretching/PassiveROM MT p flex, ext x X flex in sitting with tibial IR and distraction x x        Ultrasound [place]             Exercise/Activity              Quad set TE 20x5" x x x x        Heel slides TE x20 with SOS x10 x X 20  x        Hip add ball/ Hip abd Tband TE p Ball squeeze 20x5"    UBKF green x20 xx DC ball sq        x UBKF green x20        Bridges with ball squeeze TE p   X 20 x         March>SLR TE p            SAQ/LAQ TE x20 ea xx xx X LAQ xx        Sidelying hip abd/clams TE p            Prone ham curls TE p            Strap Hamstring/Quad stretch TE p   2 x 30" HS x        Stair Stretches TE p stair stretches flex, ext  10 x 5" each x 3x20" ea        Standing alternating-  marches, ham curls, hip abd, hip ext TA p            Minisquats/HR TA p   X 20  HR x20        Tandem>SLS NMR p            Wobbleboard/BAPS NMR             TKE TA p            Sidestepping TA p            Ascending/descending stairs reciprocally TA   4" x 5          Forward step ups/Lateral Step ups TA p  FSU 6" x 10   FSU 6" x 10  LSU 6" x 10 x20 ea        Forward step downs>dips TA p            Cable column/Totalgym TE  LegPress TE             Recumbent bike/elliptical TE p RB seat 12 rocking x8' X seat 11 x Full rev x10'        Treadmill/Elliptical TE                           Gait training GT             HEP updated  As above; printout provided    Educated on swelling and elevation            Gait training 32919 GT              Moist Heat/Cold Pack 97010   EStim 97014     MH/CP    ES  20 20 20 10         Manual Therapy 97140 MT  20 15 15 15         Ultrasound 97035  [place]             Therapeutic exercise 97110 TE 25 40 38 30 25        Therapeutic Activity 97530 TA   15 15 15         Neuromuscular Reeducation 97112  NMR             TOTAL TREATMENT TIME  (untimed+timed minutes)  55 80 88 90 65        TIMED CODE TREATMENT  (timed code minutes-  shared minutes)  25 60 68 60 55        ** Medicare-add KX at 5th visit if PT&ST/10h visit if PT only**           Add KX    Therapist's Initials  KR KR AW AW KR

## 2018-10-11 NOTE — Progress Notes (Signed)
 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]   Referral Source: [name], MD     Date:10/11/2018  Visit #: 5     Treatment Area: L TKR  Date of Surgery: 09/08/18  Protocol  []  Yes  Precautions/Restrictions:  Patients prefers to be treated behind curtain when appropriate:  []  Yes   Patient has therapist preference:   []  Male         []  Male         SUBJECTIVE  Pain Level (0-10 scale):               Pain Level (0-10 scale) post treatment:   Medication Changes: []  No    []  Yes (see paper medication log)  Subjective functional status/changes:   []  No changes reported    Pt returns after 1 week secondary to being in [place]  last week for his sister's funeral. He states he did his exercises while away but not as often. Pt sees his doctor Friday.      OBJECTIVE    []  Patient is post-surgical and on a strict surgical protocol that must be followed per MD instructions and to benefit the patient's successful recovery, so treatment started today    Modality: [x]  see flow sheet      Therapeutic Activities: [x]  see flow sheet     []  Other:_  Manual Therapy:  []  see flow sheet     []  Other:_    Patient Education: [x]  Review HEP    []  Progressed/Changed HEP  []  positioning   []  posture/body mechanics   []  transfers   []  heat/ice application    Other Objective/Functional Measures:  []  see evaluation/re-evaluation/progress         ASSESSMENT  []   See evaluation/progress note/reevaluation  Pt with significant spasms in his thigh during treatment; he states these aren't getting any better. He declined heat at [facility] as he feels the heat makes the spasms worse. Able to complete full rev on RB without much difficulty.     Short Term Goals: To be accomplished in 3 weeks:  1)      The patient will demonstrate independence with HEP.  2)      The patient will  ambulate with proper mechanics during stance phase of gait with full terminal hip/knee extension and 4/5 strength >200 ft.  Long Term Goals: To be accomplished in 6 weeks:   1)      The patient will be able to return to all household and full work activities without pain as demonstrated by LEFS>60.  2)      The patient will ascend/descend 1 flight of stairs with reciprocal pattern secondary to full LE ROM and 5/5 strength.  3)      The patient will be able to stand for 60 minutes without pain secondary to strength 5/5 in order to perform woodworking.   []   Patient will continue to benefit from skilled therapy to address remaining goals     PLAN  [x]   Upgrade activities as tolerated      [x]  Frequency/duration: 3x/week for 6 weeks  POC ends (date): 11/09/18  [x]   Continue plan of care    []   Discharge due to:_         Burnard L Raquet  10/11/2018  Lower Extremity Therapy Flow Sheet  [facility]     Classify Intervention   1 2 3 4 5 6 7 8 9 10    Date:  09/28/18 09/30/18 10/02/2018 10/04/2018 10/11/18        Non timed procedure              Evaluation/re-eval/  D/C  IE            Moist heat/cold pack   MH/CP MH x10' supine BOS and EOS  bolster under knee BOS xx xx X    Declined EOS        Electrical Stimulation   ES             Timed procedure                STM/MFR MT p scar (once steri strips off), retro for swelling Retro knee for swelling X plus quad in sitting x x        Joint mobilization MT p patella x x x x        Manual stretching/PassiveROM MT p flex, ext x X flex in sitting with tibial IR and distraction x x        Ultrasound US              Exercise/Activity              Quad set TE 20x5 x x x x        Heel slides TE x20 with SOS x10 x X 20  x        Hip add ball/ Hip abd Tband TE p Ball squeeze 20x5    UBKF green x20 xx DC ball sq        x UBKF green x20        Bridges with ball squeeze TE p   X 20 x        March>SLR TE p            SAQ/LAQ TE  x20 ea xx xx X LAQ xx        Sidelying hip abd/clams TE p            Prone ham curls TE p            Strap Hamstring/Quad stretch TE p   2 x 30 HS x        Stair Stretches TE p stair stretches flex, ext  10 x 5 each x 3x20 ea        Standing alternating-  marches, ham curls, hip abd, hip ext TA p            Minisquats/HR TA p   X 20  HR x20        Tandem>SLS NMR p            Wobbleboard/BAPS NMR             TKE TA p            Sidestepping TA p            Ascending/descending stairs reciprocally TA   4 x 5          Forward step ups/Lateral Step ups TA p  FSU 6 x 10   FSU 6 x 10  LSU 6 x 10 x20 ea        Forward step downs>dips TA p            Cable column/Totalgym TE  LegPress TE             Recumbent bike/elliptical TE p RB seat 12 rocking x8' X seat 11 x Full rev x10'        Treadmill/Elliptical TE                           Gait training GT             HEP updated  As above; printout provided    Educated on swelling and elevation            Gait training 02883 GT              Moist Heat/Cold Pack 97010   EStim 97014     MH/CP    ES  20 20 20 10         Manual Therapy 97140 MT  20 15 15 15         Ultrasound 97035  [place]              Therapeutic exercise 97110 TE 25 40 38 30 25        Therapeutic Activity 97530 TA   15 15 15         Neuromuscular Reeducation 97112  NMR             TOTAL TREATMENT TIME  (untimed+timed minutes)  55 80 88 90 65        TIMED CODE TREATMENT  (timed code minutes-  shared minutes)  25 60 68 60 55        ** Medicare-add KX at 5th visit if PT&ST/10h visit if PT only**           Add KX    Therapist's Initials  KR KR AW AW KR

## 2018-10-13 ENCOUNTER — Inpatient Hospital Stay: Admit: 2018-10-13 | Payer: PRIVATE HEALTH INSURANCE | Primary: Family Medicine

## 2018-10-13 NOTE — Progress Notes (Signed)
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]   Referral Source: Joel Graham, Joel S., MD     Date:10/13/2018  Visit #: 6     Treatment Area: L TKR  Date of Surgery: 09/08/18  Protocol  []  Yes  Precautions/Restrictions:  Patients prefers to be treated behind curtain when appropriate:  []  Yes   Patient has therapist preference:   []  Male         []  Male         SUBJECTIVE  Pain Level (0-10 scale):               Pain Level (0-10 scale) post treatment:   Medication Changes: []  No    []  Yes (see paper medication log)  Subjective functional status/changes:   []  No changes reported    Patient states he was given a muscle relaxer yesterday which he took last night.   He is till having the spasms whenever he relaxes so he tries to keep busy all day.     OBJECTIVE    []  Patient is post-surgical and on a strict surgical protocol that must be followed per MD instructions and to benefit the patient's successful recovery, so treatment started today    Modality: [x]  see flow sheet      Therapeutic Activities: [x]  see flow sheet     []  Other:_  Manual Therapy:  []  see flow sheet     []  Other:_    Patient Education: [x]  Review HEP    []  Progressed/Changed HEP  []  positioning   []  posture/body mechanics   []  transfers   []  heat/ice application    Other Objective/Functional Measures:  []  see evaluation/re-evaluation/progress         ASSESSMENT  []   See evaluation/progress note/reevaluation  Pt able to get to 100 degrees flexion passively with pain at end range which settled quickly.  Progressed step height and to forward step downs.  Patient felt good at end of session.    Short Term Goals: To be accomplished in 3 weeks:  1)      The patient will demonstrate independence with HEP.  2)      The patient will ambulate with proper mechanics during stance  phase of gait with full terminal hip/knee extension and 4/5 strength >200 ft.  Long Term Goals: To be accomplished in 6 weeks:   1)      The patient will be able to return to all household and full work activities without pain as demonstrated by LEFS>60.  2)      The patient will ascend/descend 1 flight of stairs with reciprocal pattern secondary to full LE ROM and 5/5 strength.  3)      The patient will be able to stand for 60 minutes without pain secondary to strength 5/5 in order to perform woodworking.   []   Patient will continue to benefit from skilled therapy to address remaining goals     PLAN  [x]   Upgrade activities as tolerated      [x]  Frequency/duration: 3x/week for 6 weeks  POC ends (date): 11/09/18  [x]   Continue plan of care    []   Discharge due to:_         Joel Blancolexis N Edison Wollschlager  10/13/2018  Lower Extremity Therapy Flow Sheet  Joel Graham     Classify Intervention   1 2 3 4 5 6 7 8 9 10    Date:  09/28/18 09/30/18 10/02/2018 10/04/2018 10/11/18 10/13/2018       Non timed procedure              Evaluation/re-eval/  D/C  IE            Moist heat/cold pack   MH/CP MH x10' supine BOS and EOS  bolster under knee BOS xx xx X    Declined EOS x       Electrical Stimulation   ES             Timed procedure                STM/MFR MT p scar (once steri strips off), retro for swelling Retro knee for swelling X plus quad in sitting x x X plus GT3       Joint mobilization MT p patella x x x x x       Manual stretching/PassiveROM MT p flex, ext x X flex in sitting with tibial IR and distraction x x X plus ext in supine x 10 each       Ultrasound [place]             Exercise/Activity              Quad set TE 20x5" x x x x x       Heel slides TE x20 with SOS x10 x X 20  x x       Hip add ball/ Hip abd Tband TE p Ball squeeze 20x5"    UBKF green x20 xx DC ball sq        x UBKF green x20 x       Bridges with ball squeeze TE p   X 20 x x       March>SLR TE p             SAQ/LAQ TE x20 ea xx xx X LAQ xx xx       Sidelying hip abd/clams TE p            Prone ham curls TE p            Strap Hamstring/Quad stretch TE p   2 x 30" HS X   X  Plus prone quad stretch 2 x 30"       Stair Stretches TE p stair stretches flex, ext  10 x 5" each x 3x20" ea 10 x 5"       Standing alternating-  marches, ham curls, hip abd, hip ext TA p            Minisquats/HR TA p   X 20  HR x20 x       Tandem>SLS NMR p            Wobbleboard/BAPS NMR             TKE TA p            Sidestepping TA p            Ascending/descending stairs reciprocally TA   4" x 5          Forward step ups/Lateral Step ups TA p  FSU 6" x 10   FSU 6" x 10  LSU 6" x 10 X 20 x20 ea 8"  Forward step downs>dips TA p     4" x 20       Cable column/Totalgym TE             LegPress TE             Recumbent bike/elliptical TE p RB seat 12 rocking x8' X seat 11 x Full rev x10' x       Treadmill/Elliptical TE                           Gait training GT             HEP updated  As above; printout provided    Educated on swelling and elevation            Gait training 65790 GT              Moist Heat/Cold Pack 97010   EStim 97014     MH/CP    ES  20 20 20 10 10        Manual Therapy 97140 MT  20 15 15 15 15        Ultrasound 97035  [place]             Therapeutic exercise 97110 TE 25 40 38 30 25 30        Therapeutic Activity 97530 TA   15 15 15 15        Neuromuscular Reeducation 97112  NMR             TOTAL TREATMENT TIME  (untimed+timed minutes)  55 80 88 90 65 70       TIMED CODE TREATMENT  (timed code minutes-  shared minutes)  25 60 68 60 55 60       ** Medicare-add KX at 5th visit if PT&ST/10h visit if PT only**           Add KX    Therapist's Initials  KR KR AW AW KR AW

## 2018-10-13 NOTE — Progress Notes (Signed)
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]   Referral Source: Joycie Peek., MD     Date:10/13/2018  Visit #: 6     Treatment Area: L TKR  Date of Surgery: 09/08/18  Protocol  []  Yes  Precautions/Restrictions:  Patients prefers to be treated behind curtain when appropriate:  []  Yes   Patient has therapist preference:   []  Male         []  Male         SUBJECTIVE  Pain Level (0-10 scale):               Pain Level (0-10 scale) post treatment:   Medication Changes: []  No    []  Yes (see paper medication log)  Subjective functional status/changes:   []  No changes reported    Patient states he was given a muscle relaxer yesterday which he took last night.   He is till having the spasms whenever he relaxes so he tries to keep busy all day.     OBJECTIVE    []  Patient is post-surgical and on a strict surgical protocol that must be followed per MD instructions and to benefit the patient's successful recovery, so treatment started today    Modality: [x]  see flow sheet      Therapeutic Activities: [x]  see flow sheet     []  Other:_  Manual Therapy:  []  see flow sheet     []  Other:_    Patient Education: [x]  Review HEP    []  Progressed/Changed HEP  []  positioning   []  posture/body mechanics   []  transfers   []  heat/ice application    Other Objective/Functional Measures:  []  see evaluation/re-evaluation/progress         ASSESSMENT  []   See evaluation/progress note/reevaluation  Pt able to get to 100 degrees flexion passively with pain at end range which settled quickly.  Progressed step height and to forward step downs.  Patient felt good at end of session.    Short Term Goals: To be accomplished in 3 weeks:  1)      The patient will demonstrate independence with HEP.  2)      The patient will ambulate with proper mechanics during stance phase of gait with  full terminal hip/knee extension and 4/5 strength >200 ft.  Long Term Goals: To be accomplished in 6 weeks:   1)      The patient will be able to return to all household and full work activities without pain as demonstrated by LEFS>60.  2)      The patient will ascend/descend 1 flight of stairs with reciprocal pattern secondary to full LE ROM and 5/5 strength.  3)      The patient will be able to stand for 60 minutes without pain secondary to strength 5/5 in order to perform woodworking.   []   Patient will continue to benefit from skilled therapy to address remaining goals     PLAN  [x]   Upgrade activities as tolerated      [x]  Frequency/duration: 3x/week for 6 weeks  POC ends (date): 11/09/18  [x]   Continue plan of care    []   Discharge due to:_         [name]  10/13/2018  Lower Extremity Therapy Flow Sheet  [place]a Carra     Classify Intervention   1 2 3 4 5 6 7 8 9 10    Date:  09/28/18 09/30/18 10/02/2018 10/04/2018 10/11/18 10/13/2018       Non timed procedure              Evaluation/re-eval/  D/C  IE            Moist heat/cold pack   MH/CP MH x10' supine BOS and EOS  bolster under knee BOS xx xx X    Declined EOS x       Electrical Stimulation   ES             Timed procedure                STM/MFR MT p scar (once steri strips off), retro for swelling Retro knee for swelling X plus quad in sitting x x X plus GT3       Joint mobilization MT p patella x x x x x       Manual stretching/PassiveROM MT p flex, ext x X flex in sitting with tibial IR and distraction x x X plus ext in supine x 10 each       Ultrasound [place]             Exercise/Activity              Quad set TE 20x5" x x x x x       Heel slides TE x20 with SOS x10 x X 20  x x       Hip add ball/ Hip abd Tband TE p Ball squeeze 20x5"    UBKF green x20 xx DC ball sq        x UBKF green x20 x       Bridges with ball squeeze TE p   X 20 x x       March>SLR TE p            SAQ/LAQ TE x20 ea xx  xx X LAQ xx xx       Sidelying hip abd/clams TE p            Prone ham curls TE p            Strap Hamstring/Quad stretch TE p   2 x 30" HS X   X  Plus prone quad stretch 2 x 30"       Stair Stretches TE p stair stretches flex, ext  10 x 5" each x 3x20" ea 10 x 5"       Standing alternating-  marches, ham curls, hip abd, hip ext TA p            Minisquats/HR TA p   X 20  HR x20 x       Tandem>SLS NMR p            Wobbleboard/BAPS NMR             TKE TA p            Sidestepping TA p            Ascending/descending stairs reciprocally TA   4" x 5          Forward step ups/Lateral Step ups TA p  FSU 6" x 10   FSU 6" x 10  LSU 6" x 10 X 20 x20 ea 8"  Forward step downs>dips TA p     4" x 20       Cable column/Totalgym TE             LegPress TE             Recumbent bike/elliptical TE p RB seat 12 rocking x8' X seat 11 x Full rev x10' x       Treadmill/Elliptical TE                           Gait training GT             HEP updated  As above; printout provided    Educated on swelling and elevation            Gait training 44010 GT              Moist Heat/Cold Pack 97010   EStim 97014     MH/CP    ES  20 20 20 10 10        Manual Therapy 97140 MT  20 15 15 15 15        Ultrasound 97035  [place]             Therapeutic exercise 97110 TE 25 40 38 30 25 30        Therapeutic Activity 97530 TA   15 15 15 15        Neuromuscular Reeducation 97112  NMR             TOTAL TREATMENT TIME  (untimed+timed minutes)  55 80 88 90 65 70       TIMED CODE TREATMENT  (timed code minutes-  shared minutes)  25 60 68 60 55 60       ** Medicare-add KX at 5th visit if PT&ST/10h visit if PT only**           Add KX    Therapist's Initials  KR KR AW AW KR AW

## 2018-10-15 ENCOUNTER — Inpatient Hospital Stay: Admit: 2018-10-15 | Payer: PRIVATE HEALTH INSURANCE | Primary: Family Medicine

## 2018-10-15 NOTE — Progress Notes (Signed)
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]   Referral Source: Joycie Peek., MD     Date:10/15/2018  Visit #: 7     Treatment Area: L TKR  Date of Surgery: 09/08/18  Protocol  []  Yes  Precautions/Restrictions:  Patients prefers to be treated behind curtain when appropriate:  []  Yes   Patient has therapist preference:   []  Male         []  Male         SUBJECTIVE  Pain Level (0-10 scale):               Pain Level (0-10 scale) post treatment:   Medication Changes: []  No    []  Yes (see paper medication log)  Subjective functional status/changes:   []  No changes reported    Pt states he is on so many things for the spasms and nothing seems to help and a lot of it makes him sick to his stomach. He sees his doctor today.     OBJECTIVE    []  Patient is post-surgical and on a strict surgical protocol that must be followed per MD instructions and to benefit the patient's successful recovery, so treatment started today    Modality: [x]  see flow sheet      Therapeutic Activities: [x]  see flow sheet     []  Other:_  Manual Therapy:  []  see flow sheet     []  Other:_    Patient Education: [x]  Review HEP    []  Progressed/Changed HEP  []  positioning   []  posture/body mechanics   []  transfers   []  heat/ice application    Other Objective/Functional Measures:  []  see evaluation/re-evaluation/progress         ASSESSMENT  []   See evaluation/progress note/reevaluation  Rehabilitation esp PROM limited by significant spasms t/o treatment. Able to push himself through exercises; plan to increase band as below next session.    Short Term Goals: To be accomplished in 3 weeks:  1)      The patient will demonstrate independence with HEP.  2)      The patient will ambulate with proper mechanics during stance  phase of gait with full terminal hip/knee extension and 4/5 strength >200 ft.  Long Term Goals: To be accomplished in 6 weeks:   1)      The patient will be able to return to all household and full work activities without pain as demonstrated by LEFS>60.  2)      The patient will ascend/descend 1 flight of stairs with reciprocal pattern secondary to full LE ROM and 5/5 strength.  3)      The patient will be able to stand for 60 minutes without pain secondary to strength 5/5 in order to perform woodworking.   []   Patient will continue to benefit from skilled therapy to address remaining goals     PLAN  [x]   Upgrade activities as tolerated      [x]  Frequency/duration: 3x/week for 6 weeks  POC ends (date): 11/09/18  [x]   Continue plan of care    []   Discharge due to:_         Tresa Endo L Siriah Treat  10/15/2018  Lower Extremity Therapy Flow Sheet  Joel Graham     Classify Intervention   1 2 3 4 5 6 7 8 9 10    Date:  09/28/18 09/30/18 10/02/2018 10/04/2018 10/11/18 10/13/2018 10/15/18      Non timed procedure              Evaluation/re-eval/  D/C  IE            Moist heat/cold pack   MH/CP MH x10' supine BOS and EOS  bolster under knee BOS xx xx X    Declined EOS x x      Electrical Stimulation   ES             Timed procedure                STM/MFR MT p scar (once steri strips off), retro for swelling Retro knee for swelling X plus quad in sitting x x X plus GT3 x      Joint mobilization MT p patella x x x x x x      Manual stretching/PassiveROM MT p flex, ext x X flex in sitting with tibial IR and distraction x x X plus ext in supine x 10 each x      Ultrasound US             Exercise/Activity              Quad set TE 20x5" x x x x x x      Heel slides TE x20 with SOS x10 x X 20  x x x      Hip add ball/ Hip abd Tband TE p Ball squeeze 20x5"    UBKF green x20 xx DC ball sq        x UBKF green x20 x x p blue     Bridges with ball squeeze TE p   X 20 x x x       March>SLR TE p            SAQ/LAQ TE x20 ea xx xx X LAQ xx xx xx      Sidelying hip abd/clams TE p            Prone ham curls TE p            Strap Hamstring/Quad stretch TE p   2 x 30" HS X   X  Plus prone quad stretch 2 x 30" xx      Stair Stretches TE p stair stretches flex, ext  10 x 5" each x 3x20" ea 10 x 5" 3x20" ea      Standing alternating-  marches, ham curls, hip abd, hip ext TA p            Minisquats/HR TA p   X 20  HR x20 x       Tandem>SLS NMR p            Wobbleboard/BAPS NMR             TKE TA p            Sidestepping TA p            Ascending/descending stairs reciprocally TA   4" x 5          Forward step ups/Lateral Step ups TA p  FSU 6" x 10   FSU 6" x 10  LSU 6" x 10 X 20 x20 ea 8"  xx      Forward step downs>dips TA p     4" x 20 x      Cable column/Totalgym TE             LegPress TE             Recumbent bike/elliptical TE p RB seat 12 rocking x8' X seat 11 x Full rev x10' x x      Treadmill/Elliptical TE                           Gait training GT             HEP updated  As above; printout provided    Educated on swelling and elevation            Gait training 96886 GT              Moist Heat/Cold Pack 97010   EStim 97014     MH/CP    ES  20 20 20 10 10 10       Manual Therapy 97140 MT  20 15 15 15 15 15       Ultrasound 97035  [place]             Therapeutic exercise 97110 TE 25 40 38 30 25 30 30       Therapeutic Activity 97530 TA   15 15 15 15 10       Neuromuscular Reeducation 97112  NMR             TOTAL TREATMENT TIME  (untimed+timed minutes)  55 80 88 90 65 70 65      TIMED CODE TREATMENT  (timed code minutes-  shared minutes)  25 60 68 60 55 60 55      ** Medicare-add KX at 5th visit if PT&ST/10h visit if PT only**           Add KX    Therapist's Initials  KR KR AW AW KR AW KR

## 2018-10-15 NOTE — Progress Notes (Signed)
 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]   Referral Source: [name], MD     Date:10/15/2018  Visit #: 7     Treatment Area: L TKR  Date of Surgery: 09/08/18  Protocol  []  Yes  Precautions/Restrictions:  Patients prefers to be treated behind curtain when appropriate:  []  Yes   Patient has therapist preference:   []  Male         []  Male         SUBJECTIVE  Pain Level (0-10 scale):               Pain Level (0-10 scale) post treatment:   Medication Changes: []  No    []  Yes (see paper medication log)  Subjective functional status/changes:   []  No changes reported    Pt states he is on so many things for the spasms and nothing seems to help and a lot of it makes him sick to his stomach. He sees his doctor today.     OBJECTIVE    []  Patient is post-surgical and on a strict surgical protocol that must be followed per MD instructions and to benefit the patient's successful recovery, so treatment started today    Modality: [x]  see flow sheet      Therapeutic Activities: [x]  see flow sheet     []  Other:_  Manual Therapy:  []  see flow sheet     []  Other:_    Patient Education: [x]  Review HEP    []  Progressed/Changed HEP  []  positioning   []  posture/body mechanics   []  transfers   []  heat/ice application    Other Objective/Functional Measures:  []  see evaluation/re-evaluation/progress         ASSESSMENT  []   See evaluation/progress note/reevaluation  Rehabilitation esp PROM limited by significant spasms t/o treatment. Able to push himself through exercises; plan to increase band as below next session.    Short Term Goals: To be accomplished in 3 weeks:  1)      The patient will demonstrate independence with HEP.  2)      The patient will ambulate with proper mechanics during stance phase of gait with full terminal hip/knee extension and 4/5  strength >200 ft.  Long Term Goals: To be accomplished in 6 weeks:   1)      The patient will be able to return to all household and full work activities without pain as demonstrated by LEFS>60.  2)      The patient will ascend/descend 1 flight of stairs with reciprocal pattern secondary to full LE ROM and 5/5 strength.  3)      The patient will be able to stand for 60 minutes without pain secondary to strength 5/5 in order to perform woodworking.   []   Patient will continue to benefit from skilled therapy to address remaining goals     PLAN  [x]   Upgrade activities as tolerated      [x]  Frequency/duration: 3x/week for 6 weeks  POC ends (date): 11/09/18  [x]   Continue plan of care    []   Discharge due to:_         Kelly L Raquet  10/15/2018  Lower Extremity Therapy Flow Sheet  Lawerance Matsuo     Classify Intervention   1 2 3 4 5 6 7 8 9 10    Date:  09/28/18 09/30/18 10/02/2018 10/04/2018 10/11/18 10/13/2018 10/15/18      Non timed procedure              Evaluation/re-eval/  D/C  IE            Moist heat/cold pack   MH/CP MH x10' supine BOS and EOS  bolster under knee BOS xx xx X    Declined EOS x x      Electrical Stimulation   ES             Timed procedure                STM/MFR MT p scar (once steri strips off), retro for swelling Retro knee for swelling X plus quad in sitting x x X plus GT3 x      Joint mobilization MT p patella x x x x x x      Manual stretching/PassiveROM MT p flex, ext x X flex in sitting with tibial IR and distraction x x X plus ext in supine x 10 each x      Ultrasound US              Exercise/Activity              Quad set TE 20x5 x x x x x x      Heel slides TE x20 with SOS x10 x X 20  x x x      Hip add ball/ Hip abd Tband TE p Ball squeeze 20x5    UBKF green x20 xx DC ball sq        x UBKF green x20 x x p blue     Bridges with ball squeeze TE p   X 20 x x x      March>SLR TE p            SAQ/LAQ TE x20 ea xx xx X LAQ xx xx xx       Sidelying hip abd/clams TE p            Prone ham curls TE p            Strap Hamstring/Quad stretch TE p   2 x 30 HS X   X  Plus prone quad stretch 2 x 30 xx      Stair Stretches TE p stair stretches flex, ext  10 x 5 each x 3x20 ea 10 x 5 3x20 ea      Standing alternating-  marches, ham curls, hip abd, hip ext TA p            Minisquats/HR TA p   X 20  HR x20 x       Tandem>SLS NMR p            Wobbleboard/BAPS NMR             TKE TA p            Sidestepping TA p            Ascending/descending stairs reciprocally TA   4 x 5          Forward step ups/Lateral Step ups TA p  FSU 6 x 10   FSU 6 x 10  LSU 6 x 10 X 20 x20 ea 8  xx      Forward step downs>dips TA p     4 x 20 x      Cable column/Totalgym TE             LegPress TE             Recumbent bike/elliptical TE p RB seat 12 rocking x8' X seat 11 x Full rev x10' x x      Treadmill/Elliptical TE                           Gait training GT             HEP updated  As above; printout provided    Educated on swelling and elevation            Gait training [id number] GT              Moist Heat/Cold Pack 97010   EStim 97014     MH/CP    ES  20 20 20 10 10 10       Manual Therapy 97140 MT  20 15 15 15 15 15       Ultrasound 97035  [place]              Therapeutic exercise 97110 TE 25 40 38 30 25 30 30       Therapeutic Activity 97530 TA   15 15 15 15 10       Neuromuscular Reeducation 97112  NMR             TOTAL TREATMENT TIME  (untimed+timed minutes)  55 80 88 90 65 70 65      TIMED CODE TREATMENT  (timed code minutes-  shared minutes)  25 60 68 60 55 60 55      ** Medicare-add KX at 5th visit if PT&ST/10h visit if PT only**           Add KX    Therapist's Initials  KR KR AW AW KR AW KR

## 2018-10-18 ENCOUNTER — Inpatient Hospital Stay: Admit: 2018-10-18 | Payer: PRIVATE HEALTH INSURANCE | Primary: Family Medicine

## 2018-10-18 NOTE — Progress Notes (Signed)
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]   Referral Source: Joycie Peek., MD     Date:10/18/2018  Visit #: 8     Treatment Area: L TKR  Date of Surgery: 09/08/18  Protocol  []  Yes  Precautions/Restrictions:  Patients prefers to be treated behind curtain when appropriate:  []  Yes   Patient has therapist preference:   []  Male         []  Male         SUBJECTIVE  Pain Level (0-10 scale):               Pain Level (0-10 scale) post treatment:   Medication Changes: []  No    []  Yes (see paper medication log)  Subjective functional status/changes:   []  No changes reported    Pt states he was started on Friday on Baclofen and Diazapem. Dr.Juliano said to give 3-5 days and if not better suggested neurologist or pain management.     OBJECTIVE    []  Patient is post-surgical and on a strict surgical protocol that must be followed per MD instructions and to benefit the patient's successful recovery, so treatment started today    Modality: [x]  see flow sheet      Therapeutic Activities: [x]  see flow sheet     []  Other:_  Manual Therapy:  []  see flow sheet     []  Other:_    Patient Education: [x]  Review HEP    []  Progressed/Changed HEP  []  positioning   []  posture/body mechanics   []  transfers   []  heat/ice application    Other Objective/Functional Measures:  []  see evaluation/re-evaluation/progress         ASSESSMENT  []   See evaluation/progress note/reevaluation  Spasms only occurring once tried STM to distal quad/incision mob in full supine flexion-rest of time fine-resolved with AAROM heel slides. Discussed with patient if really sore limiting the distance of his his 2 time/day walks (helps spasms per patient) and do just stretching that helps spasms. Worked on ROM in prone for both motions. L LE missing  terminal extension in stance secondary to tight calf/ham complex along with L LE longer with TKR and R knee varus-added TKE to focus on this.    Short Term Goals: To be accomplished in 3 weeks:  1)      The patient will demonstrate independence with HEP.  2)      The patient will ambulate with proper mechanics during stance phase of gait with full terminal hip/knee extension and 4/5 strength >200 ft.  Long Term Goals: To be accomplished in 6 weeks:   1)      The patient will be able to return to all household and full work activities without pain as demonstrated by LEFS>60.  2)      The patient will ascend/descend 1 flight of stairs with reciprocal pattern secondary to full LE ROM and 5/5 strength.  3)      The patient will be able to stand for 60 minutes without pain secondary to strength 5/5 in order to perform woodworking.   []   Patient will continue to benefit from skilled therapy to address remaining goals     PLAN  [x]   Upgrade activities as tolerated      [x]  Frequency/duration: 3x/week for 6 weeks  POC ends (date):  11/09/18  [x]   Continue plan of care    []   Discharge due to:_         Lanetta Inch, PT  10/18/2018                                                                      Lower Extremity Therapy Flow Sheet  Joel Graham     Classify Intervention   1 2 3 4 5 6 7 8 9 10    Date:  09/28/18 09/30/18 10/02/2018 10/04/2018 10/11/18 10/13/2018 10/15/18 10/18/18     Non timed procedure              Evaluation/re-eval/  D/C  IE            Moist heat/cold pack   MH/CP MH x10' supine BOS and EOS  bolster under knee BOS xx xx X    Declined EOS x x x     Electrical Stimulation   ES             Timed procedure                STM/MFR MT p scar (once steri strips off), retro for swelling Retro knee for swelling X plus quad in sitting x x X plus GT3 x Post knee in straight  incision and quad in flex     Joint mobilization MT p patella x x x x x x x     Manual stretching/PassiveROM MT p flex, ext x X flex in sitting with  tibial IR and distraction x x X plus ext in supine x 10 each x Ext/flex in supine and prone     Ultrasound [place]             Exercise/Activity              Quad set TE 20x5" x x x x x x x     Heel slides TE x20 with SOS x10 x X 20  x x x Blue pball under heel  With strap  20/5"     Hip add ball/ Hip abd Tband TE p Ball squeeze 20x5"    UBKF green x20 xx DC ball sq        x UBKF green x20 x x UBKF  BTB  20/5"       Bridges with ball squeeze TE p   X 20 x x x W/ ball  x20     March>SLR TE p       SLR  10/3"     SAQ/LAQ TE x20 ea xx xx X LAQ xx xx xx Both  x20ea     Sidelying hip abd/clams TE p            Prone ham curls TE p       x20  2 Towel folded thigh     Strap Hamstring/Quad stretch TE p   2 x 30" HS X   X  Plus prone quad stretch 2 x 30" xx Calf/ham  2/30sec     Stair Stretches TE p stair stretches flex, ext  10 x 5" each x 3x20" ea 10 x 5" 3x20" ea x     Standing alternating-  marches,  ham curls, hip abd, hip ext TA p            Minisquats/HR TA p   X 20  HR x20 x  x     Tandem>SLS NMR p            Wobbleboard/BAPS NMR             TKE TA p       GTB  10/10"     Sidestepping TA p            Ascending/descending stairs reciprocally TA   4" x 5          Forward step ups/Lateral Step ups TA p  FSU 6" x 10   FSU 6" x 10  LSU 6" x 10 X 20 x20 ea 8"  xx Both 8"  x20ea     Forward step downs>dips TA p     4" x 20 x FSD 4" x20     Cable column/Totalgym TE             LegPress TE             Recumbent bike/elliptical TE p RB seat 12 rocking x8' X seat 11 x Full rev x10' x x M2 seat 11  x10     Treadmill/Elliptical TE                           Gait training GT             HEP updated  As above; printout provided    Educated on swelling and elevation            Gait training [id number] GT              Moist Heat/Cold Pack 97010   EStim 97014     MH/CP    ES  20 20 20 10 10 10 10      Manual Therapy 97140 MT  20 15 15 15 15 15 10      Ultrasound 97035  US             Therapeutic exercise 97110 TE 25 40 38 30 25 30 30  35      Therapeutic Activity 97530 TA   15 15 15 15 10 10      Neuromuscular Reeducation 97112  NMR             TOTAL TREATMENT TIME  (untimed+timed minutes)  55 80 88 90 65 70 65 65     TIMED CODE TREATMENT  (timed code minutes-  shared minutes)  25 60 68 60 55 60 55 55     ** Medicare-add KX at 5th visit if PT&ST/10h visit if PT only**           Add KX    Therapist's Initials  KR KR AW AW KR AW KR HDB

## 2018-10-18 NOTE — Progress Notes (Signed)
 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]   Referral Source: Joel Norleen RAMAN., MD     Date:10/18/2018  Visit #: 8     Treatment Area: L TKR  Date of Surgery: 09/08/18  Protocol  []  Yes  Precautions/Restrictions:  Patients prefers to be treated behind curtain when appropriate:  []  Yes   Patient has therapist preference:   []  Male         []  Male         SUBJECTIVE  Pain Level (0-10 scale):               Pain Level (0-10 scale) post treatment:   Medication Changes: []  No    []  Yes (see paper medication log)  Subjective functional status/changes:   []  No changes reported    Pt states he was started on Friday on Baclofen and Diazapem. Dr.Juliano said to give 3-5 days and if not better suggested neurologist or pain management.     OBJECTIVE    []  Patient is post-surgical and on a strict surgical protocol that must be followed per MD instructions and to benefit the patient's successful recovery, so treatment started today    Modality: [x]  see flow sheet      Therapeutic Activities: [x]  see flow sheet     []  Other:_  Manual Therapy:  []  see flow sheet     []  Other:_    Patient Education: [x]  Review HEP    []  Progressed/Changed HEP  []  positioning   []  posture/body mechanics   []  transfers   []  heat/ice application    Other Objective/Functional Measures:  []  see evaluation/re-evaluation/progress         ASSESSMENT  []   See evaluation/progress note/reevaluation  Spasms only occurring once tried STM to distal quad/incision mob in full supine flexion-rest of time fine-resolved with AAROM heel slides. Discussed with patient if really sore limiting the distance of his his 2 time/day walks (helps spasms per patient) and do just stretching that helps spasms. Worked on ROM in prone for both motions. L LE missing terminal extension in stance secondary to tight  calf/ham complex along with L LE longer with TKR and R knee varus-added TKE to focus on this.    Short Term Goals: To be accomplished in 3 weeks:  1)      The patient will demonstrate independence with HEP.  2)      The patient will ambulate with proper mechanics during stance phase of gait with full terminal hip/knee extension and 4/5 strength >200 ft.  Long Term Goals: To be accomplished in 6 weeks:   1)      The patient will be able to return to all household and full work activities without pain as demonstrated by LEFS>60.  2)      The patient will ascend/descend 1 flight of stairs with reciprocal pattern secondary to full LE ROM and 5/5 strength.  3)      The patient will be able to stand for 60 minutes without pain secondary to strength 5/5 in order to perform woodworking.   []   Patient will continue to benefit from skilled therapy to address remaining goals     PLAN  [x]   Upgrade activities as tolerated      [x]  Frequency/duration: 3x/week for 6 weeks  POC ends (date):  11/09/18  [x]   Continue plan of care    []   Discharge due to:_         Joel Graham, PT  10/18/2018                                                                      Lower Extremity Therapy Flow Sheet  Joel Graham     Classify Intervention   1 2 3 4 5 6 7 8 9 10    Date:  09/28/18 09/30/18 10/02/2018 10/04/2018 10/11/18 10/13/2018 10/15/18 10/18/18     Non timed procedure              Evaluation/re-eval/  D/C  IE            Moist heat/cold pack   MH/CP MH x10' supine BOS and EOS  bolster under knee BOS xx xx X    Declined EOS x x x     Electrical Stimulation   ES             Timed procedure                STM/MFR MT p scar (once steri strips off), retro for swelling Retro knee for swelling X plus quad in sitting x x X plus GT3 x Post knee in straight  incision and quad in flex     Joint mobilization MT p patella x x x x x x x     Manual stretching/PassiveROM MT p flex, ext x X flex in sitting with tibial IR and distraction x x X plus ext in  supine x 10 each x Ext/flex in supine and prone     Ultrasound US              Exercise/Activity              Quad set TE 20x5 x x x x x x x     Heel slides TE x20 with SOS x10 x X 20  x x x Blue pball under heel  With strap  20/5     Hip add ball/ Hip abd Tband TE p Ball squeeze 20x5    UBKF green x20 xx DC ball sq        x UBKF green x20 x x UBKF  BTB  20/5       Bridges with ball squeeze TE p   X 20 x x x W/ ball  x20     March>SLR TE p       SLR  10/3     SAQ/LAQ TE x20 ea xx xx X LAQ xx xx xx Both  x20ea     Sidelying hip abd/clams TE p            Prone ham curls TE p       x20  2 Towel folded thigh     Strap Hamstring/Quad stretch TE p   2 x 30 HS X   X  Plus prone quad stretch 2 x 30 xx Calf/ham  2/30sec     Stair Stretches TE p stair stretches flex, ext  10 x 5 each x 3x20 ea 10 x 5 3x20 ea x     Standing alternating-  marches,  ham curls, hip abd, hip ext TA p            Minisquats/HR TA p   X 20  HR x20 x  x     Tandem>SLS NMR p            Wobbleboard/BAPS NMR             TKE TA p       GTB  10/10     Sidestepping TA p            Ascending/descending stairs reciprocally TA   4 x 5          Forward step ups/Lateral Step ups TA p  FSU 6 x 10   FSU 6 x 10  LSU 6 x 10 X 20 x20 ea 8  xx Both 8  x20ea     Forward step downs>dips TA p     4 x 20 x FSD 4 x20     Cable column/Totalgym TE             LegPress TE             Recumbent bike/elliptical TE p RB seat 12 rocking x8' X seat 11 x Full rev x10' x x M2 seat 11  x10     Treadmill/Elliptical TE                           Gait training GT             HEP updated  As above; printout provided    Educated on swelling and elevation            Gait training [id number] GT              Moist Heat/Cold Pack 97010   EStim 97014     MH/CP    ES  20 20 20 10 10 10 10      Manual Therapy 97140 MT  20 15 15 15 15 15 10      Ultrasound 97035  US              Therapeutic exercise 97110 TE 25 40 38 30 25 30 30  35     Therapeutic Activity 97530 TA   15 15 15 15 10 10       Neuromuscular Reeducation 97112  NMR             TOTAL TREATMENT TIME  (untimed+timed minutes)  55 80 88 90 65 70 65 65     TIMED CODE TREATMENT  (timed code minutes-  shared minutes)  25 60 68 60 55 60 55 55     ** Medicare-add KX at 5th visit if PT&ST/10h visit if PT only**           Add KX    Therapist's Initials  KR KR AW AW KR AW KR HDB

## 2018-10-20 ENCOUNTER — Inpatient Hospital Stay: Admit: 2018-10-20 | Payer: PRIVATE HEALTH INSURANCE | Primary: Family Medicine

## 2018-10-20 NOTE — Progress Notes (Signed)
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]   Referral Source: Joycie Peek., MD     Date:10/20/2018  Visit #: 9     Treatment Area: L TKR  Date of Surgery: 09/08/18  Protocol  []  Yes  Precautions/Restrictions:  Patients prefers to be treated behind curtain when appropriate:  []  Yes   Patient has therapist preference:   []  Male         []  Male         SUBJECTIVE  Pain Level (0-10 scale):               Pain Level (0-10 scale) post treatment:   Medication Changes: []  No    []  Yes (see paper medication log)  Subjective functional status/changes:   []  No changes reported    Pt states he felt really good the first day after he started the new medication and was able to slept 7 hours.  Yesterday was not as good and he had the spasms though not as badly as before, but only slept 3 hours last night.  He also did not go for his twice daily walk that first day but stayed active outside and throughout the day.  Feels he is not really able to stretch his knee straight so well at home.     OBJECTIVE    []  Patient is post-surgical and on a strict surgical protocol that must be followed per MD instructions and to benefit the patient's successful recovery, so treatment started today    Modality: [x]  see flow sheet      Therapeutic Activities: [x]  see flow sheet     []  Other:_  Manual Therapy:  []  see flow sheet     []  Other:_    Patient Education: [x]  Review HEP    []  Progressed/Changed HEP  []  positioning   []  posture/body mechanics   []  transfers   []  heat/ice application    Other Objective/Functional Measures:  []  see evaluation/re-evaluation/progress         ASSESSMENT  []   See evaluation/progress note/reevaluation    Patient with minimal spasming during session.  Discussed with pain  avoiding the walks to see if his spasms improve.  Patient still exhibits quad weakness and lack of full extension.  Incorporated prone knee hang and recommended patient perform it at home to work on extension ROM.  Progressed strengthening further.      Short Term Goals: To be accomplished in 3 weeks:  1)      The patient will demonstrate independence with HEP.  2)      The patient will ambulate with proper mechanics during stance phase of gait with full terminal hip/knee extension and 4/5 strength >200 ft.  Long Term Goals: To be accomplished in 6 weeks:   1)      The patient will be able to return to all household and full work activities without pain as demonstrated by LEFS>60.  2)      The patient will ascend/descend 1 flight of stairs with reciprocal pattern secondary to full LE ROM and 5/5 strength.  3)      The patient will be able to stand for 60 minutes without pain secondary to strength 5/5 in order to perform woodworking.   []   Patient will continue to benefit from skilled therapy to  address remaining goals     PLAN  [x]   Upgrade activities as tolerated      [x]  Frequency/duration: 3x/week for 6 weeks  POC ends (date): 11/09/18  [x]   Continue plan of care    []   Discharge due to:_         [name]  10/20/2018                                                                      Lower Extremity Therapy Flow Sheet  Joel Graham     Classify Intervention   1 2 3 4 5 6 7 8 9 10    Date:  09/28/18 09/30/18 10/02/2018 10/04/2018 10/11/18 10/13/2018 10/15/18 10/18/18 10/20/2018    Non timed procedure              Evaluation/re-eval/  D/C  IE            Moist heat/cold pack   MH/CP MH x10' supine BOS and EOS  bolster under knee BOS xx xx X    Declined EOS x x x x    Electrical Stimulation   ES             Timed procedure                STM/MFR MT p scar (once steri strips off), retro for swelling Retro knee for swelling X plus quad in sitting x x X plus GT3 x Post knee in straight  incision and quad in flex x     Joint mobilization MT p patella x x x x x x x     Manual stretching/PassiveROM MT p flex, ext x X flex in sitting with tibial IR and distraction x x X plus ext in supine x 10 each x Ext/flex in supine and prone X ext in supine, flex in supine and seated x 10 each    Ultrasound [place]             Exercise/Activity              Quad set TE 20x5" x x x x x x x     Heel slides TE x20 with SOS x10 x X 20  x x x Blue pball under heel  With strap  20/5" x    Hip add ball/ Hip abd Tband TE p Ball squeeze 20x5"    UBKF green x20 xx DC ball sq        x UBKF green x20 x x UBKF  BTB  20/5"   X black    Bridges with ball squeeze TE p   X 20 x x x W/ ball  x20 DC    March>SLR TE p       SLR  10/3" x    SAQ/LAQ TE x20 ea xx xx X LAQ xx xx xx Both  x20ea xx    Prone knee hang TE         2'    Sidelying hip abd/clams TE p            Prone ham curls TE p       x20  2 Towel folded thigh x    Strap Hamstring/Quad stretch TE  p   2 x 30" HS X   X  Plus prone quad stretch 2 x 30" xx Calf/ham  2/30sec X HS and prone quad 3 x 30" each    Stair Stretches TE p stair stretches flex, ext  10 x 5" each x 3x20" ea 10 x 5" 3x20" ea x xx    Standing alternating-  marches, ham curls, hip abd, hip ext TA p        X 20 ext, abd with red band    Minisquats/HR TA p   X 20  HR x20 x  x Xx plus mini squats    Tandem>SLS NMR p            Wobbleboard/BAPS NMR             TKE TA p       GTB  10/10"  p   Sidestepping TA p            Ascending/descending stairs reciprocally TA   4" x 5          Forward step ups/Lateral Step ups TA p  FSU 6" x 10   FSU 6" x 10  LSU 6" x 10 X 20 x20 ea 8"  xx Both 8"  x20ea xx    Forward step downs>dips TA p     4" x 20 x FSD 4" x20 x    Cable column/Totalgym TE             LegPress TE             Recumbent bike/elliptical TE p RB seat 12 rocking x8' X seat 11 x Full rev x10' x x M2 seat 11  x10 x    Treadmill/Elliptical TE                           Gait training GT             HEP updated  As above; printout provided     Educated on swelling and elevation            Gait training [id number] GT              Moist Heat/Cold Pack 97010   EStim 97014     MH/CP    ES  20 20 20 10 10 10 10 10     Manual Therapy 97140 MT  20 15 15 15 15 15 10 15     Ultrasound 97035  US             Therapeutic exercise 97110 TE 25 40 38 30 25 30 30  35 35    Therapeutic Activity 97530 TA   15 15 15 15 10 10 15     Neuromuscular Reeducation 97112  NMR             TOTAL TREATMENT TIME  (untimed+timed minutes)  55 80 88 90 65 70 65 65 85    TIMED CODE TREATMENT  (timed code minutes-  shared minutes)  25 60 68 60 55 60 55 55 65    ** Medicare-add KX at 5th visit if PT&ST/10h visit if PT only**           Add KX    Therapist's Initials  KR KR AW AW KR AW KR HDB AW

## 2018-10-20 NOTE — Progress Notes (Signed)
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]   Referral Source: Joycie Peek., MD     Date:10/20/2018  Visit #: 9     Treatment Area: L TKR  Date of Surgery: 09/08/18  Protocol  []  Yes  Precautions/Restrictions:  Patients prefers to be treated behind curtain when appropriate:  []  Yes   Patient has therapist preference:   []  Male         []  Male         SUBJECTIVE  Pain Level (0-10 scale):               Pain Level (0-10 scale) post treatment:   Medication Changes: []  No    []  Yes (see paper medication log)  Subjective functional status/changes:   []  No changes reported    Pt states he felt really good the first day after he started the new medication and was able to slept 7 hours.  Yesterday was not as good and he had the spasms though not as badly as before, but only slept 3 hours last night.  He also did not go for his twice daily walk that first day but stayed active outside and throughout the day.  Feels he is not really able to stretch his knee straight so well at home.     OBJECTIVE    []  Patient is post-surgical and on a strict surgical protocol that must be followed per MD instructions and to benefit the patient's successful recovery, so treatment started today    Modality: [x]  see flow sheet      Therapeutic Activities: [x]  see flow sheet     []  Other:_  Manual Therapy:  []  see flow sheet     []  Other:_    Patient Education: [x]  Review HEP    []  Progressed/Changed HEP  []  positioning   []  posture/body mechanics   []  transfers   []  heat/ice application    Other Objective/Functional Measures:  []  see evaluation/re-evaluation/progress         ASSESSMENT  []   See evaluation/progress note/reevaluation    Patient with minimal spasming during session.  Discussed with pain avoiding the walks to see if his spasms improve.  Patient still  exhibits quad weakness and lack of full extension.  Incorporated prone knee hang and recommended patient perform it at home to work on extension ROM.  Progressed strengthening further.      Short Term Goals: To be accomplished in 3 weeks:  1)      The patient will demonstrate independence with HEP.  2)      The patient will ambulate with proper mechanics during stance phase of gait with full terminal hip/knee extension and 4/5 strength >200 ft.  Long Term Goals: To be accomplished in 6 weeks:   1)      The patient will be able to return to all household and full work activities without pain as demonstrated by LEFS>60.  2)      The patient will ascend/descend 1 flight of stairs with reciprocal pattern secondary to full LE ROM and 5/5 strength.  3)      The patient will be able to stand for 60 minutes without pain secondary to strength 5/5 in order to perform woodworking.   []   Patient will continue to benefit from skilled therapy to  address remaining goals     PLAN  [x]   Upgrade activities as tolerated      [x]  Frequency/duration: 3x/week for 6 weeks  POC ends (date): 11/09/18  [x]   Continue plan of care    []   Discharge due to:_         Joel Graham  10/20/2018                                                                      Lower Extremity Therapy Flow Sheet  Joel Graham     Classify Intervention   1 2 3 4 5 6 7 8 9 10    Date:  09/28/18 09/30/18 10/02/2018 10/04/2018 10/11/18 10/13/2018 10/15/18 10/18/18 10/20/2018    Non timed procedure              Evaluation/re-eval/  D/C  IE            Moist heat/cold pack   MH/CP MH x10' supine BOS and EOS  bolster under knee BOS xx xx X    Declined EOS x x x x    Electrical Stimulation   ES             Timed procedure                STM/MFR MT p scar (once steri strips off), retro for swelling Retro knee for swelling X plus quad in sitting x x X plus GT3 x Post knee in straight  incision and quad in flex x    Joint mobilization MT p patella x x x x x x x     Manual  stretching/PassiveROM MT p flex, ext x X flex in sitting with tibial IR and distraction x x X plus ext in supine x 10 each x Ext/flex in supine and prone X ext in supine, flex in supine and seated x 10 each    Ultrasound [place]             Exercise/Activity              Quad set TE 20x5" x x x x x x x     Heel slides TE x20 with SOS x10 x X 20  x x x Blue pball under heel  With strap  20/5" x    Hip add ball/ Hip abd Tband TE p Ball squeeze 20x5"    UBKF green x20 xx DC ball sq        x UBKF green x20 x x UBKF  BTB  20/5"   X black    Bridges with ball squeeze TE p   X 20 x x x W/ ball  x20 DC    March>SLR TE p       SLR  10/3" x    SAQ/LAQ TE x20 ea xx xx X LAQ xx xx xx Both  x20ea xx    Prone knee hang TE         2'    Sidelying hip abd/clams TE p            Prone ham curls TE p       x20  2 Towel folded thigh x    Strap Hamstring/Quad stretch TE  p   2 x 30" HS X   X  Plus prone quad stretch 2 x 30" xx Calf/ham  2/30sec X HS and prone quad 3 x 30" each    Stair Stretches TE p stair stretches flex, ext  10 x 5" each x 3x20" ea 10 x 5" 3x20" ea x xx    Standing alternating-  marches, ham curls, hip abd, hip ext TA p        X 20 ext, abd with red band    Minisquats/HR TA p   X 20  HR x20 x  x Xx plus mini squats    Tandem>SLS NMR p            Wobbleboard/BAPS NMR             TKE TA p       GTB  10/10"  p   Sidestepping TA p            Ascending/descending stairs reciprocally TA   4" x 5          Forward step ups/Lateral Step ups TA p  FSU 6" x 10   FSU 6" x 10  LSU 6" x 10 X 20 x20 ea 8"  xx Both 8"  x20ea xx    Forward step downs>dips TA p     4" x 20 x FSD 4" x20 x    Cable column/Totalgym TE             LegPress TE             Recumbent bike/elliptical TE p RB seat 12 rocking x8' X seat 11 x Full rev x10' x x M2 seat 11  x10 x    Treadmill/Elliptical TE                           Gait training GT             HEP updated  As above; printout provided    Educated on swelling and elevation            Gait training 28413 GT               Moist Heat/Cold Pack 97010   EStim 97014     MH/CP    ES  20 20 20 10 10 10 10 10     Manual Therapy 97140 MT  20 15 15 15 15 15 10 15     Ultrasound 97035  [place]             Therapeutic exercise 97110 TE 25 40 38 30 25 30 30  35 35    Therapeutic Activity 97530 TA   15 15 15 15 10 10 15     Neuromuscular Reeducation 97112  NMR             TOTAL TREATMENT TIME  (untimed+timed minutes)  55 80 88 90 65 70 65 65 85    TIMED CODE TREATMENT  (timed code minutes-  shared minutes)  25 60 68 60 55 60 55 55 65    ** Medicare-add KX at 5th visit if PT&ST/10h visit if PT only**           Add KX    Therapist's Initials  KR KR AW AW KR AW KR HDB AW

## 2018-10-22 ENCOUNTER — Inpatient Hospital Stay: Admit: 2018-10-22 | Payer: PRIVATE HEALTH INSURANCE | Primary: Family Medicine

## 2018-10-22 NOTE — Progress Notes (Signed)
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]   Referral Source: Joel Graham., MD     Date:10/22/2018  Visit #: 10     Treatment Area: L TKR  Date of Surgery: 09/08/18  Protocol  []  Yes  Precautions/Restrictions:  Patients prefers to be treated behind curtain when appropriate:  []  Yes   Patient has therapist preference:   []  Male         []  Male         SUBJECTIVE  Pain Level (0-10 scale):               Pain Level (0-10 scale) post treatment:   Medication Changes: []  No    []  Yes (see paper medication log)  Subjective functional status/changes:   []  No changes reported    Pt states he tried another medication that he is taking along with the other new one and that seems to be helping with the spasms.     OBJECTIVE    []  Patient is post-surgical and on a strict surgical protocol that must be followed per MD instructions and to benefit the patient's successful recovery, so treatment started today    Modality: [x]  see flow sheet      Therapeutic Activities: [x]  see flow sheet     []  Other:_  Manual Therapy:  []  see flow sheet     []  Other:_    Patient Education: [x]  Review HEP    []  Progressed/Changed HEP  []  positioning   []  posture/body mechanics   []  transfers   []  heat/ice application    Other Objective/Functional Measures:  []  see evaluation/re-evaluation/progress    Pre-stretch: flex 104 deg, ext -7 deg  Post-stretch: flex 106, ext -4 deg     ASSESSMENT  []   See evaluation/progress note/reevaluation  Began with warm up on bike; pt a little tight at first but loosened up quickly. See measurements above; pt with only minimal improvement in ROM after significant PROM stretching by PT. PT emphasized importance of stretching in multiple ways at home to help improve ROM while pt is not at therapy.        Short Term Goals: To be accomplished in 3 weeks:  1)      The patient will demonstrate independence with HEP.  2)      The patient will ambulate with proper mechanics during stance phase of gait with full terminal hip/knee extension and 4/5 strength >200 ft.  Long Term Goals: To be accomplished in 6 weeks:   1)      The patient will be able to return to all household and full work activities without pain as demonstrated by LEFS>60.  2)      The patient will ascend/descend 1 flight of stairs with reciprocal pattern secondary to full LE ROM and 5/5 strength.  3)      The patient will be able to stand for 60 minutes without pain secondary to strength 5/5 in order to perform woodworking.   []   Patient will continue to benefit from skilled therapy to address remaining goals     PLAN  [x]   Upgrade activities as tolerated      [x]  Frequency/duration: 3x/week for 6 weeks  POC ends (date): 11/09/18  [x]   Continue plan of care    []   Discharge due to:_  Joel Graham L Joel Graham  10/22/2018                                                                      Lower Extremity Therapy Flow Sheet  Joel BreedWilliam Graham     Classify Intervention   1 2 3 4 5 6 7 8 9 10    Date:  09/28/18 09/30/18 10/02/2018 10/04/2018 10/11/18 10/13/2018 10/15/18 10/18/18 10/20/2018 10/22/18   Non timed procedure              Evaluation/re-eval/  D/C  IE            Moist heat/cold pack   MH/CP MH x10' supine BOS and EOS  bolster under knee BOS xx xx X    Declined EOS x x x x declined   Electrical Stimulation   ES             Timed procedure                STM/MFR MT p scar (once steri strips off), retro for swelling Retro knee for swelling X plus quad in sitting x x X plus GT3 x Post knee in straight  incision and quad in flex x x   Joint mobilization MT p patella x x x x x x x     Manual stretching/PassiveROM MT p flex, ext x X flex in sitting with tibial IR and distraction x x X plus ext in supine x 10 each x Ext/flex in  supine and prone X ext in supine, flex in supine and seated x 10 each xx   Ultrasound US             Exercise/Activity              Quad set TE 20x5" x x x x x x x  20x10" on 1/2 foam   Heel slides TE x20 with SOS x10 x X 20  x x x Blue pball under heel  With strap  20/5" x With SOS 15x10"   Hip add ball/ Hip abd Tband TE p Ball squeeze 20x5"    UBKF green x20 xx DC ball sq        x UBKF green x20 x x UBKF  BTB  20/5"   X black x   Bridges with ball squeeze TE p   X 20 x x x W/ ball  x20 DC    March>SLR TE p       SLR  10/3" x    SAQ/LAQ TE x20 ea xx xx X LAQ xx xx xx Both  x20ea xx 1# ea   Prone knee hang TE         2'    Sidelying hip abd/clams TE p            Prone ham curls TE p       x20  2 Towel folded thigh x    Strap Hamstring/Quad stretch TE p   2 x 30" HS X   X  Plus prone quad stretch 2 x 30" xx Calf/ham  2/30sec X HS and prone quad 3 x 30" each x   Stair Stretches TE p stair stretches flex, ext  10 x 5" each  x 3x20" ea 10 x 5" 3x20" ea x xx 10x10" ea   Standing alternating-  marches, ham curls, hip abd, hip ext TA p        X 20 ext, abd with red band    Minisquats/HR TA p   X 20  HR x20 x  x Xx plus mini squats    Tandem>SLS NMR p            Wobbleboard/BAPS NMR             TKE TA p       GTB  10/10"  p   Sidestepping TA p            Ascending/descending stairs reciprocally TA   4" x 5          Forward step ups/Lateral Step ups TA p  FSU 6" x 10   FSU 6" x 10  LSU 6" x 10 X 20 x20 ea 8"  xx Both 8"  x20ea xx    Forward step downs>dips TA p     4" x 20 x FSD 4" x20 x    Cable column/Totalgym TE             LegPress TE             Recumbent bike/elliptical TE p RB seat 12 rocking x8' X seat 11 x Full rev x10' x x M2 seat 11  x10 x BOS   Treadmill/Elliptical TE                           Gait training GT             HEP updated  As above; printout provided    Educated on swelling and elevation            Gait training 03474 GT              Moist Heat/Cold Pack 97010   EStim 97014     MH/CP     ES  20 20 20 10 10 10 10 10     Manual Therapy 97140 MT  20 15 15 15 15 15 10 15 15    Ultrasound 97035  [place]             Therapeutic exercise 97110 TE 25 40 38 30 25 30 30 35 35 40    Therapeutic Activity 97530 TA   15 15 15 15 10 10 15     Neuromuscular Reeducation 97112  NMR             TOTAL TREATMENT TIME  (untimed+timed minutes)  55 80 88 90 65 70 65 65 85 55    TIMED CODE TREATMENT  (timed code minutes-  shared minutes)  25 60 68 60 55 60 55 55 65 55    ** Medicare-add KX at 5th visit if PT&ST/10h visit if PT only**           Add KX    Therapist's Initials  KR KR AW AW KR AW KR HDB AW KR

## 2018-10-22 NOTE — Progress Notes (Signed)
 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]   Referral Source: [name], MD     Date:10/22/2018  Visit #: 10     Treatment Area: L TKR  Date of Surgery: 09/08/18  Protocol  []  Yes  Precautions/Restrictions:  Patients prefers to be treated behind curtain when appropriate:  []  Yes   Patient has therapist preference:   []  Male         []  Male         SUBJECTIVE  Pain Level (0-10 scale):               Pain Level (0-10 scale) post treatment:   Medication Changes: []  No    []  Yes (see paper medication log)  Subjective functional status/changes:   []  No changes reported    Pt states he tried another medication that he is taking along with the other new one and that seems to be helping with the spasms.     OBJECTIVE    []  Patient is post-surgical and on a strict surgical protocol that must be followed per MD instructions and to benefit the patient's successful recovery, so treatment started today    Modality: [x]  see flow sheet      Therapeutic Activities: [x]  see flow sheet     []  Other:_  Manual Therapy:  []  see flow sheet     []  Other:_    Patient Education: [x]  Review HEP    []  Progressed/Changed HEP  []  positioning   []  posture/body mechanics   []  transfers   []  heat/ice application    Other Objective/Functional Measures:  []  see evaluation/re-evaluation/progress    Pre-stretch: flex 104 deg, ext -7 deg  Post-stretch: flex 106, ext -4 deg     ASSESSMENT  []   See evaluation/progress note/reevaluation  Began with warm up on bike; pt a little tight at first but loosened up quickly. See measurements above; pt with only minimal improvement in ROM after significant PROM stretching by PT. PT emphasized importance of stretching in multiple ways at home to help improve ROM while pt is not at therapy.       Short Term Goals: To be accomplished in 3  weeks:  1)      The patient will demonstrate independence with HEP.  2)      The patient will ambulate with proper mechanics during stance phase of gait with full terminal hip/knee extension and 4/5 strength >200 ft.  Long Term Goals: To be accomplished in 6 weeks:   1)      The patient will be able to return to all household and full work activities without pain as demonstrated by LEFS>60.  2)      The patient will ascend/descend 1 flight of stairs with reciprocal pattern secondary to full LE ROM and 5/5 strength.  3)      The patient will be able to stand for 60 minutes without pain secondary to strength 5/5 in order to perform woodworking.   []   Patient will continue to benefit from skilled therapy to address remaining goals     PLAN  [x]   Upgrade activities as tolerated      [x]  Frequency/duration: 3x/week for 6 weeks  POC ends (date): 11/09/18  [x]   Continue plan of care    []   Discharge due to:_  Kelly L Raquet  10/22/2018                                                                      Lower Extremity Therapy Flow Sheet  Joel Graham     Classify Intervention   1 2 3 4 5 6 7 8 9 10    Date:  09/28/18 09/30/18 10/02/2018 10/04/2018 10/11/18 10/13/2018 10/15/18 10/18/18 10/20/2018 10/22/18   Non timed procedure              Evaluation/re-eval/  D/C  IE            Moist heat/cold pack   MH/CP MH x10' supine BOS and EOS  bolster under knee BOS xx xx X    Declined EOS x x x x declined   Electrical Stimulation   ES             Timed procedure                STM/MFR MT p scar (once steri strips off), retro for swelling Retro knee for swelling X plus quad in sitting x x X plus GT3 x Post knee in straight  incision and quad in flex x x   Joint mobilization MT p patella x x x x x x x     Manual stretching/PassiveROM MT p flex, ext x X flex in sitting with tibial IR and distraction x x X plus ext in supine x 10 each x Ext/flex in supine and prone X ext in supine, flex in supine and seated x 10 each xx   Ultrasound US               Exercise/Activity              Quad set TE 20x5 x x x x x x x  20x10 on 1/2 foam   Heel slides TE x20 with SOS x10 x X 20  x x x Blue pball under heel  With strap  20/5 x With SOS 15x10   Hip add ball/ Hip abd Tband TE p Ball squeeze 20x5    UBKF green x20 xx DC ball sq        x UBKF green x20 x x UBKF  BTB  20/5   X black x   Bridges with ball squeeze TE p   X 20 x x x W/ ball  x20 DC    March>SLR TE p       SLR  10/3 x    SAQ/LAQ TE x20 ea xx xx X LAQ xx xx xx Both  x20ea xx 1# ea   Prone knee hang TE         2'    Sidelying hip abd/clams TE p            Prone ham curls TE p       x20  2 Towel folded thigh x    Strap Hamstring/Quad stretch TE p   2 x 30 HS X   X  Plus prone quad stretch 2 x 30 xx Calf/ham  2/30sec X HS and prone quad 3 x 30 each x   Stair Stretches TE p stair stretches flex, ext  10 x 5 each  x 3x20 ea 10 x 5 3x20 ea x xx 10x10 ea   Standing alternating-  marches, ham curls, hip abd, hip ext TA p        X 20 ext, abd with red band    Minisquats/HR TA p   X 20  HR x20 x  x Xx plus mini squats    Tandem>SLS NMR p            Wobbleboard/BAPS NMR             TKE TA p       GTB  10/10  p   Sidestepping TA p            Ascending/descending stairs reciprocally TA   4 x 5          Forward step ups/Lateral Step ups TA p  FSU 6 x 10   FSU 6 x 10  LSU 6 x 10 X 20 x20 ea 8  xx Both 8  x20ea xx    Forward step downs>dips TA p     4 x 20 x FSD 4 x20 x    Cable column/Totalgym TE             LegPress TE             Recumbent bike/elliptical TE p RB seat 12 rocking x8' X seat 11 x Full rev x10' x x M2 seat 11  x10 x BOS   Treadmill/Elliptical TE                           Gait training GT             HEP updated  As above; printout provided    Educated on swelling and elevation            Gait training 02883 GT              Moist Heat/Cold Pack 97010   EStim 97014     MH/CP    ES  20 20 20 10 10 10 10 10     Manual Therapy 97140 MT  20 15 15 15 15 15 10 15 15    Ultrasound 97035  [place]               Therapeutic exercise 97110 TE 25 40 38 30 25 30 30 35 35 40    Therapeutic Activity 97530 TA   15 15 15 15 10 10 15     Neuromuscular Reeducation 97112  NMR             TOTAL TREATMENT TIME  (untimed+timed minutes)  55 80 88 90 65 70 65 65 85 55    TIMED CODE TREATMENT  (timed code minutes-  shared minutes)  25 60 68 60 55 60 55 55 65 55    ** Medicare-add KX at 5th visit if PT&ST/10h visit if PT only**           Add KX    Therapist's Initials  KR KR AW AW KR AW KR HDB AW KR

## 2018-10-25 ENCOUNTER — Inpatient Hospital Stay: Admit: 2018-10-25 | Payer: PRIVATE HEALTH INSURANCE | Primary: Family Medicine

## 2018-10-25 DIAGNOSIS — R262 Difficulty in walking, not elsewhere classified: Secondary | ICD-10-CM

## 2018-10-25 NOTE — Progress Notes (Signed)
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]   Referral Source: Joycie Peek., MD     Date:10/25/2018  Visit #: 11     Treatment Area: L TKR  Date of Surgery: 09/08/18  Protocol  []  Yes  Precautions/Restrictions:  Patients prefers to be treated behind curtain when appropriate:  []  Yes   Patient has therapist preference:   []  Male         []  Male         SUBJECTIVE  Pain Level (0-10 scale):               Pain Level (0-10 scale) post treatment:   Medication Changes: []  No    []  Yes (see paper medication log)  Subjective functional status/changes:   []  No changes reported    Pt states none of the medications seem to be helping him at this point.  The doctor is sending him to a neurologist today he thinks or maybe pain management, he is not sure.  He states it was too uncomfortable to start on the bike at his last visit.  He has tried not walking this whole past weekend.     OBJECTIVE    []  Patient is post-surgical and on a strict surgical protocol that must be followed per MD instructions and to benefit the patient's successful recovery, so treatment started today    Modality: [x]  see flow sheet      Therapeutic Activities: [x]  see flow sheet     []  Other:_  Manual Therapy:  []  see flow sheet     []  Other:_    Patient Education: [x]  Review HEP    []  Progressed/Changed HEP  []  positioning   []  posture/body mechanics   []  transfers   []  heat/ice application    Other Objective/Functional Measures:  []  see evaluation/re-evaluation/progress    Pre-stretch: flex 104 deg, ext -7 deg  Post-stretch: flex 106, ext -4 deg     ASSESSMENT  []   See evaluation/progress note/reevaluation    Initiated session with HP, no pain with bike at EOS.   Patient experienced intermittent spasms throughout exercises and took short breaks and dangled  LE.  Needs cues for correct form with squats.      Short Term Goals: To be accomplished in 3 weeks:  1)      The patient will demonstrate independence with HEP.  2)      The patient will ambulate with proper mechanics during stance phase of gait with full terminal hip/knee extension and 4/5 strength >200 ft.  Long Term Goals: To be accomplished in 6 weeks:   1)      The patient will be able to return to all household and full work activities without pain as demonstrated by LEFS>60.  2)      The patient will ascend/descend 1 flight of stairs with reciprocal pattern secondary to full LE ROM and 5/5 strength.  3)      The patient will be able to stand for 60 minutes without pain secondary to strength 5/5 in order to perform woodworking.   []   Patient will continue to benefit from skilled therapy to address remaining goals     PLAN  [x]   Upgrade activities as tolerated      [x]  Frequency/duration: 3x/week for 6 weeks  POC ends (  date): 11/09/18  [x]   Continue plan of care    []   Discharge due to:_         [name]  10/25/2018                                                                             Lower Extremity Therapy Flow Sheet  Joel Graham     Classify Intervention   11            Date:  10/25/2018            Non timed procedure              Evaluation/re-eval/  D/C              Moist heat/cold pack   MH/CP MH x10' supine            Electrical Stimulation   ES             Timed procedure                STM/MFR MT Post knee in straight  incision and quad in flex            Joint mobilization MT p patella            Manual stretching/PassiveROM MT X ext in supine, flex in supine and seated x 10 each            Ultrasound [place]             Exercise/Activity              Quad set TE 20 x 10"            Heel slides TE X 20 with strap, foot on ball            UBKFO TE Black x 20            SLR TE X 10            SAQ/LAQ TE SAQ x 2 p  LAQ x 20            Prone knee hang TE 2'             Sidelying hip abd/clams TE p            Prone ham curls TE X 20 towel under thigh            Strap Hamstring/Quad stretch TE 3 x 30" each            Stair Stretches TE  flex, ext 10 x 10" each            Standing alternating-hip abd, hip ext TA Red x 20 each            Minisquats/HR TA X 20 each            TKE TA             Sidestepping TA p            Ascending/descending stairs reciprocally TA             Forward step ups/Lateral Step ups TA 8" x 20  each            Forward step downs>dips TA 4" x 20            Cable column/Totalgym TE             LegPress TE             Recumbent bike/elliptical TE Seat 11 x 10            Gait training GT             HEP updated              Gait training [id number] GT              Moist Heat/Cold Pack 97010   EStim 97014     MH/CP    ES 10            Manual Therapy 97140 MT 15            Ultrasound 97035  [place]             Therapeutic exercise 97110 TE 35            Therapeutic Activity 97530 TA 15            Neuromuscular Reeducation 97112  NMR             TOTAL TREATMENT TIME  (untimed+timed minutes)  95            TIMED CODE TREATMENT  (timed code minutes-  shared minutes)  65            ** Medicare-add KX at 5th visit if PT&ST/10h visit if PT only**           Add KX    Therapist's Initials  AW                      Lower Extremity Therapy Flow Sheet  Joel Graham     Classify Intervention   1 2 3 4 5 6 7 8 9 10    Date:  09/28/18 09/30/18 10/02/2018 10/04/2018 10/11/18 10/13/2018 10/15/18 10/18/18 10/20/2018 10/22/18   Non timed procedure              Evaluation/re-eval/  D/C  IE            Moist heat/cold pack   MH/CP MH x10' supine BOS and EOS  bolster under knee BOS xx xx X    Declined EOS x x x x declined   Electrical Stimulation   ES             Timed procedure                STM/MFR MT p scar (once steri strips off), retro for swelling Retro knee for swelling X plus quad in sitting x x X plus GT3 x Post knee in straight  incision and quad in flex x x    Joint mobilization MT p patella x x x x x x x     Manual stretching/PassiveROM MT p flex, ext x X flex in sitting with tibial IR and distraction x x X plus ext in supine x 10 each x Ext/flex in supine and prone X ext in supine, flex in supine and seated x 10 each xx   Ultrasound US             Exercise/Activity  Quad set TE 20x5" x x x x x x x  20x10" on 1/2 foam   Heel slides TE x20 with SOS x10 x X 20  x x x Blue pball under heel  With strap  20/5" x With SOS 15x10"   Hip add ball/ Hip abd Tband TE p Ball squeeze 20x5"    UBKF green x20 xx DC ball sq        x UBKF green x20 x x UBKF  BTB  20/5"   X black x   Bridges with ball squeeze TE p   X 20 x x x W/ ball  x20 DC    March>SLR TE p       SLR  10/3" x    SAQ/LAQ TE x20 ea xx xx X LAQ xx xx xx Both  x20ea xx 1# ea   Prone knee hang TE         2'    Sidelying hip abd/clams TE p            Prone ham curls TE p       x20  2 Towel folded thigh x    Strap Hamstring/Quad stretch TE p   2 x 30" HS X   X  Plus prone quad stretch 2 x 30" xx Calf/ham  2/30sec X HS and prone quad 3 x 30" each x   Stair Stretches TE p stair stretches flex, ext  10 x 5" each x 3x20" ea 10 x 5" 3x20" ea x xx 10x10" ea   Standing alternating-  marches, ham curls, hip abd, hip ext TA p        X 20 ext, abd with red band    Minisquats/HR TA p   X 20  HR x20 x  x Xx plus mini squats    Tandem>SLS NMR p            Wobbleboard/BAPS NMR             TKE TA p       GTB  10/10"  p   Sidestepping TA p            Ascending/descending stairs reciprocally TA   4" x 5          Forward step ups/Lateral Step ups TA p  FSU 6" x 10   FSU 6" x 10  LSU 6" x 10 X 20 x20 ea 8"  xx Both 8"  x20ea xx    Forward step downs>dips TA p     4" x 20 x FSD 4" x20 x    Cable column/Totalgym TE             LegPress TE             Recumbent bike/elliptical TE p RB seat 12 rocking x8' X seat 11 x Full rev x10' x x M2 seat 11  x10 x BOS   Treadmill/Elliptical TE                           Gait training GT              HEP updated  As above; printout provided    Educated on swelling and elevation            Gait training 16109 GT              Moist Heat/Cold Pack 97010  EStim 97014     MH/CP    ES  20 20 20 10 10 10 10 10     Manual Therapy 97140 MT  20 15 15 15 15 15 10 15 15    Ultrasound 49702  [place]             Therapeutic exercise 97110 TE 25 40 38 30 25 30 30 35 35 40    Therapeutic Activity 97530 TA   15 15 15 15 10 10 15     Neuromuscular Reeducation 97112  NMR             TOTAL TREATMENT TIME  (untimed+timed minutes)  55 80 88 90 65 70 65 65 85 55    TIMED CODE TREATMENT  (timed code minutes-  shared minutes)  25 60 68 60 55 60 55 55 65 55    ** Medicare-add KX at 5th visit if PT&ST/10h visit if PT only**           Add KX    Therapist's Initials  KR KR AW AW KR AW KR HDB AW KR

## 2018-10-25 NOTE — Progress Notes (Signed)
 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]   Referral Source: Joel Norleen RAMAN., MD     Date:10/25/2018  Visit #: 11     Treatment Area: L TKR  Date of Surgery: 09/08/18  Protocol  []  Yes  Precautions/Restrictions:  Patients prefers to be treated behind curtain when appropriate:  []  Yes   Patient has therapist preference:   []  Male         []  Male         SUBJECTIVE  Pain Level (0-10 scale):               Pain Level (0-10 scale) post treatment:   Medication Changes: []  No    []  Yes (see paper medication log)  Subjective functional status/changes:   []  No changes reported    Pt states none of the medications seem to be helping him at this point.  The doctor is sending him to a neurologist today he thinks or maybe pain management, he is not sure.  He states it was too uncomfortable to start on the bike at his last visit.  He has tried not walking this whole past weekend.     OBJECTIVE    []  Patient is post-surgical and on a strict surgical protocol that must be followed per MD instructions and to benefit the patient's successful recovery, so treatment started today    Modality: [x]  see flow sheet      Therapeutic Activities: [x]  see flow sheet     []  Other:_  Manual Therapy:  []  see flow sheet     []  Other:_    Patient Education: [x]  Review HEP    []  Progressed/Changed HEP  []  positioning   []  posture/body mechanics   []  transfers   []  heat/ice application    Other Objective/Functional Measures:  []  see evaluation/re-evaluation/progress    Pre-stretch: flex 104 deg, ext -7 deg  Post-stretch: flex 106, ext -4 deg     ASSESSMENT  []   See evaluation/progress note/reevaluation    Initiated session with HP, no pain with bike at EOS.   Patient experienced intermittent spasms throughout exercises and took short breaks and dangled LE.  Needs cues for  correct form with squats.      Short Term Goals: To be accomplished in 3 weeks:  1)      The patient will demonstrate independence with HEP.  2)      The patient will ambulate with proper mechanics during stance phase of gait with full terminal hip/knee extension and 4/5 strength >200 ft.  Long Term Goals: To be accomplished in 6 weeks:   1)      The patient will be able to return to all household and full work activities without pain as demonstrated by LEFS>60.  2)      The patient will ascend/descend 1 flight of stairs with reciprocal pattern secondary to full LE ROM and 5/5 strength.  3)      The patient will be able to stand for 60 minutes without pain secondary to strength 5/5 in order to perform woodworking.   []   Patient will continue to benefit from skilled therapy to address remaining goals     PLAN  [x]   Upgrade activities as tolerated      [x]  Frequency/duration: 3x/week for 6 weeks  POC ends (  date): 11/09/18  [x]   Continue plan of care    []   Discharge due to:_         Joel Graham  10/25/2018                                                                             Lower Extremity Therapy Flow Sheet  Joel Graham     Classify Intervention   11            Date:  10/25/2018            Non timed procedure              Evaluation/re-eval/  D/C              Moist heat/cold pack   MH/CP MH x10' supine            Electrical Stimulation   ES             Timed procedure                STM/MFR MT Post knee in straight  incision and quad in flex            Joint mobilization MT p patella            Manual stretching/PassiveROM MT X ext in supine, flex in supine and seated x 10 each            Ultrasound US              Exercise/Activity              Quad set TE 20 x 10            Heel slides TE X 20 with strap, foot on ball            UBKFO TE Black x 20            SLR TE X 10            SAQ/LAQ TE SAQ x 2 p  LAQ x 20            Prone knee hang TE 2'            Sidelying hip abd/clams TE p            Prone ham  curls TE X 20 towel under thigh            Strap Hamstring/Quad stretch TE 3 x 30 each            Stair Stretches TE  flex, ext 10 x 10 each            Standing alternating-hip abd, hip ext TA Red x 20 each            Minisquats/HR TA X 20 each            TKE TA             Sidestepping TA p            Ascending/descending stairs reciprocally TA             Forward step ups/Lateral Step ups TA 8 x 20  each            Forward step downs>dips TA 4 x 20            Cable column/Totalgym TE             LegPress TE             Recumbent bike/elliptical TE Seat 11 x 10            Gait training GT             HEP updated              Gait training 02883 GT              Moist Heat/Cold Pack 97010   EStim 97014     MH/CP    ES 10            Manual Therapy 97140 MT 15            Ultrasound 97035  US              Therapeutic exercise 97110 TE 35            Therapeutic Activity 97530 TA 15            Neuromuscular Reeducation 97112  NMR             TOTAL TREATMENT TIME  (untimed+timed minutes)  95            TIMED CODE TREATMENT  (timed code minutes-  shared minutes)  65            ** Medicare-add KX at 5th visit if PT&ST/10h visit if PT only**           Add KX    Therapist's Initials  AW                      Lower Extremity Therapy Flow Sheet  Joel Graham     Classify Intervention   1 2 3 4 5 6 7 8 9 10    Date:  09/28/18 09/30/18 10/02/2018 10/04/2018 10/11/18 10/13/2018 10/15/18 10/18/18 10/20/2018 10/22/18   Non timed procedure              Evaluation/re-eval/  D/C  IE            Moist heat/cold pack   MH/CP MH x10' supine BOS and EOS  bolster under knee BOS xx xx X    Declined EOS x x x x declined   Electrical Stimulation   ES             Timed procedure                STM/MFR MT p scar (once steri strips off), retro for swelling Retro knee for swelling X plus quad in sitting x x X plus GT3 x Post knee in straight  incision and quad in flex x x   Joint mobilization MT p patella x x x x x x x     Manual stretching/PassiveROM MT p  flex, ext x X flex in sitting with tibial IR and distraction x x X plus ext in supine x 10 each x Ext/flex in supine and prone X ext in supine, flex in supine and seated x 10 each xx   Ultrasound US              Exercise/Activity  Quad set TE 20x5 x x x x x x x  20x10 on 1/2 foam   Heel slides TE x20 with SOS x10 x X 20  x x x Blue pball under heel  With strap  20/5 x With SOS 15x10   Hip add ball/ Hip abd Tband TE p Ball squeeze 20x5    UBKF green x20 xx DC ball sq        x UBKF green x20 x x UBKF  BTB  20/5   X black x   Bridges with ball squeeze TE p   X 20 x x x W/ ball  x20 DC    March>SLR TE p       SLR  10/3 x    SAQ/LAQ TE x20 ea xx xx X LAQ xx xx xx Both  x20ea xx 1# ea   Prone knee hang TE         2'    Sidelying hip abd/clams TE p            Prone ham curls TE p       x20  2 Towel folded thigh x    Strap Hamstring/Quad stretch TE p   2 x 30 HS X   X  Plus prone quad stretch 2 x 30 xx Calf/ham  2/30sec X HS and prone quad 3 x 30 each x   Stair Stretches TE p stair stretches flex, ext  10 x 5 each x 3x20 ea 10 x 5 3x20 ea x xx 10x10 ea   Standing alternating-  marches, ham curls, hip abd, hip ext TA p        X 20 ext, abd with red band    Minisquats/HR TA p   X 20  HR x20 x  x Xx plus mini squats    Tandem>SLS NMR p            Wobbleboard/BAPS NMR             TKE TA p       GTB  10/10  p   Sidestepping TA p            Ascending/descending stairs reciprocally TA   4 x 5          Forward step ups/Lateral Step ups TA p  FSU 6 x 10   FSU 6 x 10  LSU 6 x 10 X 20 x20 ea 8  xx Both 8  x20ea xx    Forward step downs>dips TA p     4 x 20 x FSD 4 x20 x    Cable column/Totalgym TE             LegPress TE             Recumbent bike/elliptical TE p RB seat 12 rocking x8' X seat 11 x Full rev x10' x x M2 seat 11  x10 x BOS   Treadmill/Elliptical TE                           Gait training GT             HEP updated  As above; printout provided    Educated on swelling and elevation             Gait training 02883 GT              Moist Heat/Cold Pack 97010  EStim 97014     MH/CP    ES  20 20 20 10 10 10 10 10     Manual Therapy 97140 MT  20 15 15 15 15 15 10 15 15    Ultrasound 02964  US              Therapeutic exercise 97110 TE 25 40 38 30 25 30 30 35 35 40    Therapeutic Activity 97530 TA   15 15 15 15 10 10 15     Neuromuscular Reeducation 97112  NMR             TOTAL TREATMENT TIME  (untimed+timed minutes)  55 80 88 90 65 70 65 65 85 55    TIMED CODE TREATMENT  (timed code minutes-  shared minutes)  25 60 68 60 55 60 55 55 65 55    ** Medicare-add KX at 5th visit if PT&ST/10h visit if PT only**           Add KX    Therapist's Initials  KR KR AW AW KR AW KR HDB AW KR

## 2018-10-27 ENCOUNTER — Inpatient Hospital Stay: Admit: 2018-10-27 | Payer: PRIVATE HEALTH INSURANCE | Primary: Family Medicine

## 2018-10-27 NOTE — Progress Notes (Signed)
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]   Referral Source: Joycie Peek., MD     Date:10/27/2018  Visit #: 12     Treatment Area: L TKR  Date of Surgery: 09/08/18  Protocol  []  Yes  Precautions/Restrictions:  Patients prefers to be treated behind curtain when appropriate:  []  Yes   Patient has therapist preference:   []  Male         []  Male         SUBJECTIVE  Pain Level (0-10 scale):               Pain Level (0-10 scale) post treatment:   Medication Changes: []  No    []  Yes (see paper medication log)  Subjective functional status/changes:   []  No changes reported    Pt states he saw a neurologist yesterday who increased his medication for RLS.  He also recommended medical marijuana and patient has initiated the process for getting approved for that.  Patient noted he is not having the RSL symptoms, which seem to have been replaced by the spasms since the surgery.     OBJECTIVE    []  Patient is post-surgical and on a strict surgical protocol that must be followed per MD instructions and to benefit the patient's successful recovery, so treatment started today    Modality: [x]  see flow sheet      Therapeutic Activities: [x]  see flow sheet     []  Other:_  Manual Therapy:  []  see flow sheet     []  Other:_    Patient Education: [x]  Review HEP    []  Progressed/Changed HEP  []  positioning   []  posture/body mechanics   []  transfers   []  heat/ice application    Other Objective/Functional Measures:  []  see evaluation/re-evaluation/progress         ASSESSMENT  []   See evaluation/progress note/reevaluation    Patient without spasms or needing to rest during session.  Patient noted feeling looser and like he was strengthening.  Able to make full revolutions on bike with less compensation than previous visit.       Short Term Goals: To be accomplished in 3 weeks:  1)      The patient will demonstrate independence with HEP.  2)      The patient will ambulate with proper mechanics during stance phase of gait with full terminal hip/knee extension and 4/5 strength >200 ft.  Long Term Goals: To be accomplished in 6 weeks:   1)      The patient will be able to return to all household and full work activities without pain as demonstrated by LEFS>60.  2)      The patient will ascend/descend 1 flight of stairs with reciprocal pattern secondary to full LE ROM and 5/5 strength.  3)      The patient will be able to stand for 60 minutes without pain secondary to strength 5/5 in order to perform woodworking.   []   Patient will continue to benefit from skilled therapy to address remaining goals     PLAN  [x]   Upgrade activities as tolerated      [x]  Frequency/duration: 3x/week for 6 weeks  POC ends (date): 11/09/18  [x]   Continue plan of care    []   Discharge due to:_  [name]  10/27/2018                                                                             Lower Extremity Therapy Flow Sheet  Joel Graham     Classify Intervention   11 12           Date:  10/25/2018 10/27/2018           Non timed procedure              Evaluation/re-eval/  D/C              Moist heat/cold pack   MH/CP MH x10' supine x           Electrical Stimulation   ES             Timed procedure                STM/MFR MT Post knee in straight  incision and quad in flex x           Joint mobilization MT p patella X inf/sup           Manual stretching/PassiveROM MT X ext in supine, flex in supine and seated x 10 each x           Ultrasound [place]             Exercise/Activity              Quad set TE 20 x 10" x           Heel slides TE X 20 with strap, foot on ball X no ball p - ball          UBKFO TE Black x 20 x           SLR TE X 10 x           SAQ/LAQ TE SAQ x 2 p  LAQ x 20 X 20 LAQ           Prone knee hang TE 2' x            Sidelying hip abd/clams TE p            Prone ham curls TE X 20 towel under thigh x           Strap Hamstring/Quad stretch TE 3 x 30" each x           Stair Stretches TE  flex, ext 10 x 10" each x           Standing alternating-hip abd, hip ext TA Red x 20 each x           Minisquats/HR TA X 20 each x           TKE TA             Sidestepping TA p            Ascending/descending stairs reciprocally TA             Forward step ups/Lateral Step ups TA 8" x 20 each x           Forward step downs>dips TA 4" x  20 x           Cable column/Totalgym TE             LegPress TE             Recumbent bike/elliptical TE Seat 11 x 10 x           Gait training GT             HEP updated              Gait training [id number] GT              Moist Heat/Cold Pack 97010   EStim 97014     MH/CP    ES 10 10           Manual Therapy 97140 MT 15 15           Ultrasound 97035  US             Therapeutic exercise 97110 TE 35 35           Therapeutic Activity 97530 TA 15 15           Neuromuscular Reeducation 97112  NMR             TOTAL TREATMENT TIME  (untimed+timed minutes)  95 85           TIMED CODE TREATMENT  (timed code minutes-  shared minutes)  65 65           ** Medicare-add KX at 5th visit if PT&ST/10h visit if PT only**           Add KX    Therapist's Initials  AW AW                     Lower Extremity Therapy Flow Sheet  Joel Graham     Classify Intervention   1 2 3 4 5 6 7 8 9 10    Date:  09/28/18 09/30/18 10/02/2018 10/04/2018 10/11/18 10/13/2018 10/15/18 10/18/18 10/20/2018 10/22/18   Non timed procedure              Evaluation/re-eval/  D/C  IE            Moist heat/cold pack   MH/CP MH x10' supine BOS and EOS  bolster under knee BOS xx xx X    Declined EOS x x x x declined   Electrical Stimulation   ES             Timed procedure                STM/MFR MT p scar (once steri strips off), retro for swelling Retro knee for swelling X plus quad in sitting x x X plus GT3 x Post knee in straight  incision and quad in flex x x    Joint mobilization MT p patella x x x x x x x     Manual stretching/PassiveROM MT p flex, ext x X flex in sitting with tibial IR and distraction x x X plus ext in supine x 10 each x Ext/flex in supine and prone X ext in supine, flex in supine and seated x 10 each xx   Ultrasound US             Exercise/Activity              Quad set TE 20x5" x x x x x x x  20x10" on 1/2 foam  Heel slides TE x20 with SOS x10 x X 20  x x x Blue pball under heel  With strap  20/5" x With SOS 15x10"   Hip add ball/ Hip abd Tband TE p Ball squeeze 20x5"    UBKF green x20 xx DC ball sq        x UBKF green x20 x x UBKF  BTB  20/5"   X black x   Bridges with ball squeeze TE p   X 20 x x x W/ ball  x20 DC    March>SLR TE p       SLR  10/3" x    SAQ/LAQ TE x20 ea xx xx X LAQ xx xx xx Both  x20ea xx 1# ea   Prone knee hang TE         2'    Sidelying hip abd/clams TE p            Prone ham curls TE p       x20  2 Towel folded thigh x    Strap Hamstring/Quad stretch TE p   2 x 30" HS X   X  Plus prone quad stretch 2 x 30" xx Calf/ham  2/30sec X HS and prone quad 3 x 30" each x   Stair Stretches TE p stair stretches flex, ext  10 x 5" each x 3x20" ea 10 x 5" 3x20" ea x xx 10x10" ea   Standing alternating-  marches, ham curls, hip abd, hip ext TA p        X 20 ext, abd with red band    Minisquats/HR TA p   X 20  HR x20 x  x Xx plus mini squats    Tandem>SLS NMR p            Wobbleboard/BAPS NMR             TKE TA p       GTB  10/10"  p   Sidestepping TA p            Ascending/descending stairs reciprocally TA   4" x 5          Forward step ups/Lateral Step ups TA p  FSU 6" x 10   FSU 6" x 10  LSU 6" x 10 X 20 x20 ea 8"  xx Both 8"  x20ea xx    Forward step downs>dips TA p     4" x 20 x FSD 4" x20 x    Cable column/Totalgym TE             LegPress TE             Recumbent bike/elliptical TE p RB seat 12 rocking x8' X seat 11 x Full rev x10' x x M2 seat 11  x10 x BOS   Treadmill/Elliptical TE                           Gait training GT              HEP updated  As above; printout provided    Educated on swelling and elevation            Gait training [id number] GT              Moist Heat/Cold Pack 97010   EStim 97014     MH/CP    ES  20 20 20 10 10  10  10 10    Manual Therapy 97140 MT  20 15 15 15 15 15 10 15 15    Ultrasound 30940  [place]             Therapeutic exercise 97110 TE 25 40 38 30 25 30 30 35 35 40    Therapeutic Activity 97530 TA   15 15 15 15 10 10 15     Neuromuscular Reeducation 97112  NMR             TOTAL TREATMENT TIME  (untimed+timed minutes)  55 80 88 90 65 70 65 65 85 55    TIMED CODE TREATMENT  (timed code minutes-  shared minutes)  25 60 68 60 55 60 55 55 65 55    ** Medicare-add KX at 5th visit if PT&ST/10h visit if PT only**           Add KX    Therapist's Initials  KR KR AW AW KR AW KR HDB AW KR

## 2018-10-27 NOTE — Progress Notes (Signed)
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]   Referral Source: Joycie Peek., MD     Date:10/27/2018  Visit #: 12     Treatment Area: L TKR  Date of Surgery: 09/08/18  Protocol  []  Yes  Precautions/Restrictions:  Patients prefers to be treated behind curtain when appropriate:  []  Yes   Patient has therapist preference:   []  Male         []  Male         SUBJECTIVE  Pain Level (0-10 scale):               Pain Level (0-10 scale) post treatment:   Medication Changes: []  No    []  Yes (see paper medication log)  Subjective functional status/changes:   []  No changes reported    Pt states he saw a neurologist yesterday who increased his medication for RLS.  He also recommended medical marijuana and patient has initiated the process for getting approved for that.  Patient noted he is not having the RSL symptoms, which seem to have been replaced by the spasms since the surgery.     OBJECTIVE    []  Patient is post-surgical and on a strict surgical protocol that must be followed per MD instructions and to benefit the patient's successful recovery, so treatment started today    Modality: [x]  see flow sheet      Therapeutic Activities: [x]  see flow sheet     []  Other:_  Manual Therapy:  []  see flow sheet     []  Other:_    Patient Education: [x]  Review HEP    []  Progressed/Changed HEP  []  positioning   []  posture/body mechanics   []  transfers   []  heat/ice application    Other Objective/Functional Measures:  []  see evaluation/re-evaluation/progress         ASSESSMENT  []   See evaluation/progress note/reevaluation    Patient without spasms or needing to rest during session.  Patient noted feeling looser and like he was strengthening.  Able to make full revolutions on bike with less compensation than previous visit.      Short Term Goals: To be accomplished in  3 weeks:  1)      The patient will demonstrate independence with HEP.  2)      The patient will ambulate with proper mechanics during stance phase of gait with full terminal hip/knee extension and 4/5 strength >200 ft.  Long Term Goals: To be accomplished in 6 weeks:   1)      The patient will be able to return to all household and full work activities without pain as demonstrated by LEFS>60.  2)      The patient will ascend/descend 1 flight of stairs with reciprocal pattern secondary to full LE ROM and 5/5 strength.  3)      The patient will be able to stand for 60 minutes without pain secondary to strength 5/5 in order to perform woodworking.   []   Patient will continue to benefit from skilled therapy to address remaining goals     PLAN  [x]   Upgrade activities as tolerated      [x]  Frequency/duration: 3x/week for 6 weeks  POC ends (date): 11/09/18  [x]   Continue plan of care    []   Discharge due to:_  [name]  10/27/2018                                                                             Lower Extremity Therapy Flow Sheet  Joel Graham     Classify Intervention   11 12           Date:  10/25/2018 10/27/2018           Non timed procedure              Evaluation/re-eval/  D/C              Moist heat/cold pack   MH/CP MH x10' supine x           Electrical Stimulation   ES             Timed procedure                STM/MFR MT Post knee in straight  incision and quad in flex x           Joint mobilization MT p patella X inf/sup           Manual stretching/PassiveROM MT X ext in supine, flex in supine and seated x 10 each x           Ultrasound [place]             Exercise/Activity              Quad set TE 20 x 10" x           Heel slides TE X 20 with strap, foot on ball X no ball p - ball          UBKFO TE Black x 20 x           SLR TE X 10 x           SAQ/LAQ TE SAQ x 2 p  LAQ x 20 X 20 LAQ           Prone knee hang TE 2' x           Sidelying hip abd/clams TE p            Prone ham curls TE X 20 towel  under thigh x           Strap Hamstring/Quad stretch TE 3 x 30" each x           Stair Stretches TE  flex, ext 10 x 10" each x           Standing alternating-hip abd, hip ext TA Red x 20 each x           Minisquats/HR TA X 20 each x           TKE TA             Sidestepping TA p            Ascending/descending stairs reciprocally TA             Forward step ups/Lateral Step ups TA 8" x 20 each x           Forward step downs>dips TA 4" x  20 x           Cable column/Totalgym TE             LegPress TE             Recumbent bike/elliptical TE Seat 11 x 10 x           Gait training GT             HEP updated              Gait training 16109 GT              Moist Heat/Cold Pack 97010   EStim 97014     MH/CP    ES 10 10           Manual Therapy 97140 MT 15 15           Ultrasound 97035  [place]             Therapeutic exercise 97110 TE 35 35           Therapeutic Activity 97530 TA 15 15           Neuromuscular Reeducation 97112  NMR             TOTAL TREATMENT TIME  (untimed+timed minutes)  95 85           TIMED CODE TREATMENT  (timed code minutes-  shared minutes)  65 65           ** Medicare-add KX at 5th visit if PT&ST/10h visit if PT only**           Add KX    Therapist's Initials  AW AW                     Lower Extremity Therapy Flow Sheet  Joel Graham     Classify Intervention   1 2 3 4 5 6 7 8 9 10    Date:  09/28/18 09/30/18 10/02/2018 10/04/2018 10/11/18 10/13/2018 10/15/18 10/18/18 10/20/2018 10/22/18   Non timed procedure              Evaluation/re-eval/  D/C  IE            Moist heat/cold pack   MH/CP MH x10' supine BOS and EOS  bolster under knee BOS xx xx X    Declined EOS x x x x declined   Electrical Stimulation   ES             Timed procedure                STM/MFR MT p scar (once steri strips off), retro for swelling Retro knee for swelling X plus quad in sitting x x X plus GT3 x Post knee in straight  incision and quad in flex x x   Joint mobilization MT p patella x x x x x x x     Manual stretching/PassiveROM MT p  flex, ext x X flex in sitting with tibial IR and distraction x x X plus ext in supine x 10 each x Ext/flex in supine and prone X ext in supine, flex in supine and seated x 10 each xx   Ultrasound [place]             Exercise/Activity              Quad set TE 20x5" x x x x x x x  20x10" on 1/2 foam  Heel slides TE x20 with SOS x10 x X 20  x x x Blue pball under heel  With strap  20/5" x With SOS 15x10"   Hip add ball/ Hip abd Tband TE p Ball squeeze 20x5"    UBKF green x20 xx DC ball sq        x UBKF green x20 x x UBKF  BTB  20/5"   X black x   Bridges with ball squeeze TE p   X 20 x x x W/ ball  x20 DC    March>SLR TE p       SLR  10/3" x    SAQ/LAQ TE x20 ea xx xx X LAQ xx xx xx Both  x20ea xx 1# ea   Prone knee hang TE         2'    Sidelying hip abd/clams TE p            Prone ham curls TE p       x20  2 Towel folded thigh x    Strap Hamstring/Quad stretch TE p   2 x 30" HS X   X  Plus prone quad stretch 2 x 30" xx Calf/ham  2/30sec X HS and prone quad 3 x 30" each x   Stair Stretches TE p stair stretches flex, ext  10 x 5" each x 3x20" ea 10 x 5" 3x20" ea x xx 10x10" ea   Standing alternating-  marches, ham curls, hip abd, hip ext TA p        X 20 ext, abd with red band    Minisquats/HR TA p   X 20  HR x20 x  x Xx plus mini squats    Tandem>SLS NMR p            Wobbleboard/BAPS NMR             TKE TA p       GTB  10/10"  p   Sidestepping TA p            Ascending/descending stairs reciprocally TA   4" x 5          Forward step ups/Lateral Step ups TA p  FSU 6" x 10   FSU 6" x 10  LSU 6" x 10 X 20 x20 ea 8"  xx Both 8"  x20ea xx    Forward step downs>dips TA p     4" x 20 x FSD 4" x20 x    Cable column/Totalgym TE             LegPress TE             Recumbent bike/elliptical TE p RB seat 12 rocking x8' X seat 11 x Full rev x10' x x M2 seat 11  x10 x BOS   Treadmill/Elliptical TE                           Gait training GT             HEP updated  As above; printout provided    Educated on swelling and elevation             Gait training 25956 GT              Moist Heat/Cold Pack 97010   EStim 97014     MH/CP    ES  20 20 20 10 10  10  10 10    Manual Therapy 97140 MT  20 15 15 15 15 15 10 15 15    Ultrasound 29562  [place]             Therapeutic exercise 97110 TE 25 40 38 30 25 30 30 35 35 40    Therapeutic Activity 97530 TA   15 15 15 15 10 10 15     Neuromuscular Reeducation 97112  NMR             TOTAL TREATMENT TIME  (untimed+timed minutes)  55 80 88 90 65 70 65 65 85 55    TIMED CODE TREATMENT  (timed code minutes-  shared minutes)  25 60 68 60 55 60 55 55 65 55    ** Medicare-add KX at 5th visit if PT&ST/10h visit if PT only**           Add KX    Therapist's Initials  KR KR AW AW KR AW KR HDB AW KR

## 2018-10-29 ENCOUNTER — Inpatient Hospital Stay: Admit: 2018-10-29 | Payer: PRIVATE HEALTH INSURANCE | Primary: Family Medicine

## 2018-10-29 NOTE — Progress Notes (Signed)
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 The new medication is helping at night but doesn't seem to be helping during the day, and the spasms are really bothering him today.     OBJECTIVE    []  Patient is post-surgical and on a strict surgical protocol that must be followed per MD instructions and to benefit the patient's successful recovery, so treatment started today    Modality: [x]  see flow sheet      Therapeutic Activities: [x]  see flow sheet     []  Other:_  Manual Therapy:  []  see flow sheet     []  Other:_    Patient Education: [x]  Review HEP    []  Progressed/Changed HEP  []  positioning   []  posture/body mechanics   []  transfers   []  heat/ice application    Other Objective/Functional Measures:  []  see evaluation/re-evaluation/progress         ASSESSMENT  []   See evaluation/progress note/reevaluation  Focused on increased flex and ext ROM with manual and exercises as below. Pt with moderate discomfort during treatment due to increased spasms. Sees his doc next Thursday.       Short Term Goals: To be accomplished in 3 weeks:  1)      The patient will demonstrate independence with HEP.  2)      The patient will ambulate with proper mechanics during stance  phase of gait with full terminal hip/knee extension and 4/5 strength >200 ft.  Long Term Goals: To be accomplished in 6 weeks:   1)      The patient will be able to return to all household and full work activities without pain as demonstrated by LEFS>60.  2)      The patient will ascend/descend 1 flight of stairs with reciprocal pattern secondary to full LE ROM and 5/5 strength.  3)      The patient will be able to stand for 60 minutes without pain secondary to strength 5/5 in order to perform woodworking.   []   Patient will continue to benefit from skilled therapy to address remaining goals     PLAN  [x]   Upgrade activities as tolerated      [x]  Frequency/duration: 3x/week for 6 weeks  POC ends (date): 11/09/18  [x]   Continue plan of care    []   Discharge due to:_         Tresa EndoKelly L Samual Beals  10/29/2018  Lower Extremity Therapy Flow Sheet  Joel Graham     Classify Intervention   11 12 13 14 15 16 17 18 19 20    Date:  10/25/2018 10/27/2018 10/29/18          Non timed procedure              Evaluation/re-eval/  D/C              Moist heat/cold pack   MH/CP MH x10' supine x x          Electrical Stimulation   ES             Timed procedure                STM/MFR MT Post knee in straight  incision and quad in flex x x          Joint mobilization MT p patella X inf/sup x          Manual stretching/PassiveROM MT X ext in supine, flex in supine and seated x 10 each x x          Ultrasound [place]             Exercise/Activity              Quad set TE 20 x 10" x Heel on foam          Heel slides TE X 20 with strap, foot on ball X no ball 10x10"          UBKFO TE Black x 20 x           SLR TE X 10 x           SAQ/LAQ TE SAQ x 2 p  LAQ x 20 X 20 LAQ           Prone knee hang TE 2' x           Sidelying hip abd/clams TE p            Prone ham curls TE X 20 towel under thigh x           Strap Hamstring/Quad stretch TE 3 x 30" each x            Stair Stretches TE  flex, ext 10 x 10" each x xx          Standing alternating-hip abd, hip ext TA Red x 20 each x           Minisquats/HR TA X 20 each x           TKE TA   Green 10x10"          Sidestepping TA p            Ascending/descending stairs reciprocally TA             Forward step ups/Lateral Step ups TA 8" x 20 each x           Forward step downs>dips TA 4" x 20 x           Cable column/Totalgym TE             LegPress TE             Recumbent bike/elliptical TE Seat 11 x 10 x Seat 9 for flex          Gait training GT             HEP updated  Gait training 85277 GT              Moist Heat/Cold Pack 97010   EStim 97014     MH/CP    ES 10 10 10           Manual Therapy 97140 MT 15 15 15           Ultrasound 97035  [place]             Therapeutic exercise 97110 TE 35 35 25          Therapeutic Activity 97530 TA 15 15 5           Neuromuscular Reeducation 97112  NMR             TOTAL TREATMENT TIME  (untimed+timed minutes)  95 85 55          TIMED CODE TREATMENT  (timed code minutes-  shared minutes)  65 65 45          ** Medicare-add KX at 5th visit if PT&ST/10h visit if PT only**           Add KX    Therapist's Initials  AW AW KR                    Lower Extremity Therapy Flow Sheet  Joel Graham     Classify Intervention   1 2 3 4 5 6 7 8 9 10    Date:  09/28/18 09/30/18 10/02/2018 10/04/2018 10/11/18 10/13/2018 10/15/18 10/18/18 10/20/2018 10/22/18   Non timed procedure              Evaluation/re-eval/  D/C  IE            Moist heat/cold pack   MH/CP MH x10' supine BOS and EOS  bolster under knee BOS xx xx X    Declined EOS x x x x declined   Electrical Stimulation   ES             Timed procedure                STM/MFR MT p scar (once steri strips off), retro for swelling Retro knee for swelling X plus quad in sitting x x X plus GT3 x Post knee in straight  incision and quad in flex x x   Joint mobilization MT p patella x x x x x x x     Manual stretching/PassiveROM MT p flex, ext x X flex in sitting with  tibial IR and distraction x x X plus ext in supine x 10 each x Ext/flex in supine and prone X ext in supine, flex in supine and seated x 10 each xx   Ultrasound [place]             Exercise/Activity              Quad set TE 20x5" x x x x x x x  20x10" on 1/2 foam   Heel slides TE x20 with SOS x10 x X 20  x x x Blue pball under heel  With strap  20/5" x With SOS 15x10"   Hip add ball/ Hip abd Tband TE p Ball squeeze 20x5"    UBKF green x20 xx DC ball sq        x UBKF green x20 x x UBKF  BTB  20/5"   X black x   Bridges with ball squeeze TE p   X 20 x x x W/  ball  x20 DC    March>SLR TE p       SLR  10/3" x    SAQ/LAQ TE x20 ea xx xx X LAQ xx xx xx Both  x20ea xx 1# ea   Prone knee hang TE         2'    Sidelying hip abd/clams TE p            Prone ham curls TE p       x20  2 Towel folded thigh x    Strap Hamstring/Quad stretch TE p   2 x 30" HS X   X  Plus prone quad stretch 2 x 30" xx Calf/ham  2/30sec X HS and prone quad 3 x 30" each x   Stair Stretches TE p stair stretches flex, ext  10 x 5" each x 3x20" ea 10 x 5" 3x20" ea x xx 10x10" ea   Standing alternating-  marches, ham curls, hip abd, hip ext TA p        X 20 ext, abd with red band    Minisquats/HR TA p   X 20  HR x20 x  x Xx plus mini squats    Tandem>SLS NMR p            Wobbleboard/BAPS NMR             TKE TA p       GTB  10/10"  p   Sidestepping TA p            Ascending/descending stairs reciprocally TA   4" x 5          Forward step ups/Lateral Step ups TA p  FSU 6" x 10   FSU 6" x 10  LSU 6" x 10 X 20 x20 ea 8"  xx Both 8"  x20ea xx    Forward step downs>dips TA p     4" x 20 x FSD 4" x20 x    Cable column/Totalgym TE             LegPress TE             Recumbent bike/elliptical TE p RB seat 12 rocking x8' X seat 11 x Full rev x10' x x M2 seat 11  x10 x BOS   Treadmill/Elliptical TE                           Gait training GT             HEP updated  As above; printout provided    Educated on swelling and elevation             Gait training [id number] GT              Moist Heat/Cold Pack 97010   EStim 97014     MH/CP    ES  20 20 20 10 10 10 10 10     Manual Therapy 97140 MT  20 15 15 15 15 15 10 15 15    Ultrasound 97035  [place]             Therapeutic exercise 97110 TE 25 40 38 30 25 30 30 35 35 40    Therapeutic Activity 97530 TA   15 15 15 15 10 10 15     Neuromuscular Reeducation 97112  NMR             TOTAL TREATMENT TIME  (  untimed+timed minutes)  55 80 88 90 65 70 65 65 85 55    TIMED CODE TREATMENT  (timed code minutes-  shared minutes)  25 60 68 60 55 60 55 55 65 55    ** Medicare-add KX at 5th visit if PT&ST/10h visit if PT only**           Add KX    Therapist's Initials  KR KR AW AW KR AW KR HDB AW KR

## 2018-10-29 NOTE — Progress Notes (Signed)
 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 The new medication is helping at night but doesn't seem to be helping during the day, and the spasms are really bothering him today.     OBJECTIVE    []  Patient is post-surgical and on a strict surgical protocol that must be followed per MD instructions and to benefit the patient's successful recovery, so treatment started today    Modality: [x]  see flow sheet      Therapeutic Activities: [x]  see flow sheet     []  Other:_  Manual Therapy:  []  see flow sheet     []  Other:_    Patient Education: [x]  Review HEP    []  Progressed/Changed HEP  []  positioning   []  posture/body mechanics   []  transfers   []  heat/ice application    Other Objective/Functional Measures:  []  see evaluation/re-evaluation/progress         ASSESSMENT  []   See evaluation/progress note/reevaluation  Focused on increased flex and ext ROM with manual and exercises as below. Pt with moderate discomfort during treatment due to increased spasms. Sees his doc next Thursday.       Short Term Goals: To be accomplished in 3 weeks:  1)      The patient will demonstrate independence with HEP.  2)      The patient will ambulate with proper mechanics during stance phase of gait with full  terminal hip/knee extension and 4/5 strength >200 ft.  Long Term Goals: To be accomplished in 6 weeks:   1)      The patient will be able to return to all household and full work activities without pain as demonstrated by LEFS>60.  2)      The patient will ascend/descend 1 flight of stairs with reciprocal pattern secondary to full LE ROM and 5/5 strength.  3)      The patient will be able to stand for 60 minutes without pain secondary to strength 5/5 in order to perform woodworking.   []   Patient will continue to benefit from skilled therapy to address remaining goals     PLAN  [x]   Upgrade activities as tolerated      [x]  Frequency/duration: 3x/week for 6 weeks  POC ends (date): 11/09/18  [x]   Continue plan of care    []   Discharge due to:_         Burnard L Raquet  10/29/2018  Lower Extremity Therapy Flow Sheet  Rashun Grattan     Classify Intervention   11 12 13 14 15 16 17 18 19 20    Date:  10/25/2018 10/27/2018 10/29/18          Non timed procedure              Evaluation/re-eval/  D/C              Moist heat/cold pack   MH/CP MH x10' supine x x          Electrical Stimulation   ES             Timed procedure                STM/MFR MT Post knee in straight  incision and quad in flex x x          Joint mobilization MT p patella X inf/sup x          Manual stretching/PassiveROM MT X ext in supine, flex in supine and seated x 10 each x x          Ultrasound US              Exercise/Activity              Quad set TE 20 x 10 x Heel on foam          Heel slides TE X 20 with strap, foot on ball X no ball 10x10          UBKFO TE Black x 20 x           SLR TE X 10 x           SAQ/LAQ TE SAQ x 2 p  LAQ x 20 X 20 LAQ           Prone knee hang TE 2' x           Sidelying hip abd/clams TE p            Prone ham curls TE X 20 towel under thigh x           Strap Hamstring/Quad stretch TE 3 x 30 each x           Stair Stretches TE  flex, ext 10 x 10 each x xx           Standing alternating-hip abd, hip ext TA Red x 20 each x           Minisquats/HR TA X 20 each x           TKE TA   Green 10x10          Sidestepping TA p            Ascending/descending stairs reciprocally TA             Forward step ups/Lateral Step ups TA 8 x 20 each x           Forward step downs>dips TA 4 x 20 x           Cable column/Totalgym TE             LegPress TE             Recumbent bike/elliptical TE Seat 11 x 10 x Seat 9 for flex          Gait training GT             HEP updated  Gait training 02883 GT              Moist Heat/Cold Pack 97010   EStim 97014     MH/CP    ES 10 10 10           Manual Therapy 97140 MT 15 15 15           Ultrasound 97035  [place]              Therapeutic exercise 97110 TE 35 35 25          Therapeutic Activity 97530 TA 15 15 5           Neuromuscular Reeducation 97112  NMR             TOTAL TREATMENT TIME  (untimed+timed minutes)  95 85 55          TIMED CODE TREATMENT  (timed code minutes-  shared minutes)  65 65 45          ** Medicare-add KX at 5th visit if PT&ST/10h visit if PT only**           Add KX    Therapist's Initials  AW AW KR                    Lower Extremity Therapy Flow Sheet  Ezeriah Luty     Classify Intervention   1 2 3 4 5 6 7 8 9 10    Date:  09/28/18 09/30/18 10/02/2018 10/04/2018 10/11/18 10/13/2018 10/15/18 10/18/18 10/20/2018 10/22/18   Non timed procedure              Evaluation/re-eval/  D/C  IE            Moist heat/cold pack   MH/CP MH x10' supine BOS and EOS  bolster under knee BOS xx xx X    Declined EOS x x x x declined   Electrical Stimulation   ES             Timed procedure                STM/MFR MT p scar (once steri strips off), retro for swelling Retro knee for swelling X plus quad in sitting x x X plus GT3 x Post knee in straight  incision and quad in flex x x   Joint mobilization MT p patella x x x x x x x     Manual stretching/PassiveROM MT p flex, ext x X flex in sitting with tibial IR and distraction x x X plus ext in supine x 10  each x Ext/flex in supine and prone X ext in supine, flex in supine and seated x 10 each xx   Ultrasound US              Exercise/Activity              Quad set TE 20x5 x x x x x x x  20x10 on 1/2 foam   Heel slides TE x20 with SOS x10 x X 20  x x x Blue pball under heel  With strap  20/5 x With SOS 15x10   Hip add ball/ Hip abd Tband TE p Ball squeeze 20x5    UBKF green x20 xx DC ball sq        x UBKF green x20 x x UBKF  BTB  20/5   X black x   Bridges with ball squeeze TE p   X 20 x x x W/  ball  x20 DC    March>SLR TE p       SLR  10/3 x    SAQ/LAQ TE x20 ea xx xx X LAQ xx xx xx Both  x20ea xx 1# ea   Prone knee hang TE         2'    Sidelying hip abd/clams TE p            Prone ham curls TE p       x20  2 Towel folded thigh x    Strap Hamstring/Quad stretch TE p   2 x 30 HS X   X  Plus prone quad stretch 2 x 30 xx Calf/ham  2/30sec X HS and prone quad 3 x 30 each x   Stair Stretches TE p stair stretches flex, ext  10 x 5 each x 3x20 ea 10 x 5 3x20 ea x xx 10x10 ea   Standing alternating-  marches, ham curls, hip abd, hip ext TA p        X 20 ext, abd with red band    Minisquats/HR TA p   X 20  HR x20 x  x Xx plus mini squats    Tandem>SLS NMR p            Wobbleboard/BAPS NMR             TKE TA p       GTB  10/10  p   Sidestepping TA p            Ascending/descending stairs reciprocally TA   4 x 5          Forward step ups/Lateral Step ups TA p  FSU 6 x 10   FSU 6 x 10  LSU 6 x 10 X 20 x20 ea 8  xx Both 8  x20ea xx    Forward step downs>dips TA p     4 x 20 x FSD 4 x20 x    Cable column/Totalgym TE             LegPress TE             Recumbent bike/elliptical TE p RB seat 12 rocking x8' X seat 11 x Full rev x10' x x M2 seat 11  x10 x BOS   Treadmill/Elliptical TE                           Gait training GT             HEP updated  As above; printout provided    Educated on swelling and elevation            Gait training 02883 GT              Moist Heat/Cold Pack 97010   EStim 97014      MH/CP    ES  20 20 20 10 10 10 10 10     Manual Therapy 97140 MT  20 15 15 15 15 15 10 15 15    Ultrasound 97035  US              Therapeutic exercise 97110 TE 25 40 38 30 25 30 30 35 35 40    Therapeutic Activity 97530 TA   15 15 15 15 10 10 15     Neuromuscular Reeducation 97112  NMR             TOTAL TREATMENT TIME  (  untimed+timed minutes)  55 80 88 90 65 70 65 65 85 55    TIMED CODE TREATMENT  (timed code minutes-  shared minutes)  25 60 68 60 55 60 55 55 65 55    ** Medicare-add KX at 5th visit if PT&ST/10h visit if PT only**           Add KX    Therapist's Initials  KR KR AW AW KR AW KR HDB AW KR

## 2018-11-01 ENCOUNTER — Inpatient Hospital Stay: Admit: 2018-11-01 | Payer: PRIVATE HEALTH INSURANCE | Primary: Family Medicine

## 2018-11-01 NOTE — Progress Notes (Signed)
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]   Referral Source: Joycie Peek., MD     Date:11/01/2018  Visit #: 14     Treatment Area: L TKR  Date of Surgery: 09/08/18  Protocol  []  Yes  Precautions/Restrictions:  Patients prefers to be treated behind curtain when appropriate:  []  Yes   Patient has therapist preference:   []  Male         []  Male         SUBJECTIVE  Pain Level (0-10 scale):               Pain Level (0-10 scale) post treatment:   Medication Changes: []  No    []  Yes (see paper medication log)  Subjective functional status/changes:   []  No changes reported    The new medication is helping at night but doesn't seem to be helping during the day, woke up at 5 with spasms-had to walk around.     OBJECTIVE    []  Patient is post-surgical and on a strict surgical protocol that must be followed per MD instructions and to benefit the patient's successful recovery, so treatment started today    Modality: [x]  see flow sheet      Therapeutic Activities: [x]  see flow sheet     []  Other:_  Manual Therapy:  []  see flow sheet     []  Other:_    Patient Education: [x]  Review HEP    []  Progressed/Changed HEP  []  positioning   []  posture/body mechanics   []  transfers   []  heat/ice application    Other Objective/Functional Measures:  []  see evaluation/re-evaluation/progress         ASSESSMENT  []   See evaluation/progress note/reevaluation  Focused on increased flex and ext ROM with manual and exercises as below. Pt with good tolerance to session. Prone extension full/flex AROM 90/PROM 105. Sees his docThursday.       Short Term Goals: To be accomplished in 3 weeks:  1)      The patient will demonstrate independence with HEP.  2)      The patient will ambulate with proper mechanics during stance  phase of gait with full terminal hip/knee extension and 4/5 strength >200 ft.  Long Term Goals: To be accomplished in 6 weeks:   1)      The patient will be able to return to all household and full work activities without pain as demonstrated by LEFS>60.  2)      The patient will ascend/descend 1 flight of stairs with reciprocal pattern secondary to full LE ROM and 5/5 strength.  3)      The patient will be able to stand for 60 minutes without pain secondary to strength 5/5 in order to perform woodworking.   []   Patient will continue to benefit from skilled therapy to address remaining goals     PLAN  [x]   Upgrade activities as tolerated      [x]  Frequency/duration: 3x/week for 6 weeks  POC ends (date): 11/09/18  [x]   Continue plan of care    []   Discharge due to:_         Lanetta Inch, PT  11/01/2018  Lower Extremity Therapy Flow Sheet  Joel Graham     Classify Intervention   11 12 13 14 15 16 17 18 19 20    Date:  10/25/2018 10/27/2018 10/29/18 11/01/17         Non timed procedure              Evaluation/re-eval/  D/C              Moist heat/cold pack   MH/CP MH x10' supine x x x         Electrical Stimulation   ES             Timed procedure                STM/MFR MT Post knee in straight  incision and quad in flex x x Distal quad/incision in full flex         Joint mobilization MT p patella X inf/sup x          Manual stretching/PassiveROM MT X ext in supine, flex in supine and seated x 10 each x x Ext/flex  Both supine/prone         Ultrasound US             Exercise/Activity              Quad set TE 20 x 10" x Heel on foam Foam  20/10"         Heel slides TE X 20 with strap, foot on ball X no ball 10x10" Ball/strap  X10/10"         UBKFO TE Black x 20 x  BLK  tband  x20ea         SLR TE X 10 x  2x10         SAQ/LAQ TE SAQ x 2 p  LAQ x 20 X 20 LAQ  1.5# both x10ea  Focus quad set         Prone knee hang TE 2' x  x2'          Sidelying hip abd/clams TE p            Prone ham curls TE X 20 towel under thigh x  x20         Strap Hamstring/Quad stretch TE 3 x 30" each x  x         Stair Stretches TE  flex, ext 10 x 10" each x xx xx         Standing alternating-hip abd, hip ext TA Red x 20 each x           Minisquats/HR TA X 20 each x  x20ea         TKE TA   Green 10x10" BTB  20/10"         Sidestepping TA p            Ascending/descending stairs reciprocally TA             Forward step ups/Lateral Step ups TA 8" x 20 each x  Both  x20 ea         Forward step downs>dips TA 4" x 20 x  x         Cable column/Totalgym TE             LegPress TE             Recumbent bike/elliptical TE Seat 11 x 10 x Seat 9 for flex Seat  10  x10'         Gait training GT             HEP updated              Gait training 16109 GT              Moist Heat/Cold Pack 97010   EStim 97014     MH/CP    ES 10 10 10 10          Manual Therapy 97140 MT 15 15 15 15          Ultrasound 97035  [place]             Therapeutic exercise 97110 TE 35 35 25 30         Therapeutic Activity 97530 TA 15 15 5 10          Neuromuscular Reeducation 97112  NMR             TOTAL TREATMENT TIME  (untimed+timed minutes)  95 85 55 65         TIMED CODE TREATMENT  (timed code minutes-  shared minutes)  65 65 45 55         ** Medicare-add KX at 5th visit if PT&ST/10h visit if PT only**           Add KX    Therapist's Initials  AW AW KR HDB                   Lower Extremity Therapy Flow Sheet  Joel Graham     Classify Intervention   1 2 3 4 5 6 7 8 9 10    Date:  09/28/18 09/30/18 10/02/2018 10/04/2018 10/11/18 10/13/2018 10/15/18 10/18/18 10/20/2018 10/22/18   Non timed procedure              Evaluation/re-eval/  D/C  IE            Moist heat/cold pack   MH/CP MH x10' supine BOS and EOS  bolster under knee BOS xx xx X    Declined EOS x x x x declined   Electrical Stimulation   ES             Timed procedure                STM/MFR MT p scar (once steri strips off), retro for swelling Retro knee  for swelling X plus quad in sitting x x X plus GT3 x Post knee in straight  incision and quad in flex x x   Joint mobilization MT p patella x x x x x x x     Manual stretching/PassiveROM MT p flex, ext x X flex in sitting with tibial IR and distraction x x X plus ext in supine x 10 each x Ext/flex in supine and prone X ext in supine, flex in supine and seated x 10 each xx   Ultrasound [place]             Exercise/Activity              Quad set TE 20x5" x x x x x x x  20x10" on 1/2 foam   Heel slides TE x20 with SOS x10 x X 20  x x x Blue pball under heel  With strap  20/5" x With SOS 15x10"   Hip add ball/ Hip abd Tband TE p Ball squeeze 20x5"    UBKF green x20 xx DC  ball sq        x UBKF green x20 x x UBKF  BTB  20/5"   X black x   Bridges with ball squeeze TE p   X 20 x x x W/ ball  x20 DC    March>SLR TE p       SLR  10/3" x    SAQ/LAQ TE x20 ea xx xx X LAQ xx xx xx Both  x20ea xx 1# ea   Prone knee hang TE         2'    Sidelying hip abd/clams TE p            Prone ham curls TE p       x20  2 Towel folded thigh x    Strap Hamstring/Quad stretch TE p   2 x 30" HS X   X  Plus prone quad stretch 2 x 30" xx Calf/ham  2/30sec X HS and prone quad 3 x 30" each x   Stair Stretches TE p stair stretches flex, ext  10 x 5" each x 3x20" ea 10 x 5" 3x20" ea x xx 10x10" ea   Standing alternating-  marches, ham curls, hip abd, hip ext TA p        X 20 ext, abd with red band    Minisquats/HR TA p   X 20  HR x20 x  x Xx plus mini squats    Tandem>SLS NMR p            Wobbleboard/BAPS NMR             TKE TA p       GTB  10/10"  p   Sidestepping TA p            Ascending/descending stairs reciprocally TA   4" x 5          Forward step ups/Lateral Step ups TA p  FSU 6" x 10   FSU 6" x 10  LSU 6" x 10 X 20 x20 ea 8"  xx Both 8"  x20ea xx    Forward step downs>dips TA p     4" x 20 x FSD 4" x20 x    Cable column/Totalgym TE             LegPress TE             Recumbent bike/elliptical TE p RB seat 12 rocking x8' X seat 11 x Full rev  x10' x x M2 seat 11  x10 x BOS   Treadmill/Elliptical TE                           Gait training GT             HEP updated  As above; printout provided    Educated on swelling and elevation            Gait training 18299 GT              Moist Heat/Cold Pack 97010   EStim 97014     MH/CP    ES  20 20 20 10 10 10 10 10     Manual Therapy 97140 MT  20 15 15 15 15 15 10 15 15    Ultrasound 97035  [place]             Therapeutic exercise 97110 TE 25 40 38 30 25 30  30  35 35 40   Therapeutic Activity 97530 TA   15 15 15 15 10 10 15     Neuromuscular Reeducation 97112  NMR             TOTAL TREATMENT TIME  (untimed+timed minutes)  55 80 88 90 65 70 65 65 85 55    TIMED CODE TREATMENT  (timed code minutes-  shared minutes)  25 60 68 60 55 60 55 55 65 55    ** Medicare-add KX at 5th visit if PT&ST/10h visit if PT only**           Add KX    Therapist's Initials  KR KR AW AW KR AW KR HDB AW KR

## 2018-11-01 NOTE — Progress Notes (Signed)
 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]   Referral Source: [name], MD     Date:11/01/2018  Visit #: 14     Treatment Area: L TKR  Date of Surgery: 09/08/18  Protocol  []  Yes  Precautions/Restrictions:  Patients prefers to be treated behind curtain when appropriate:  []  Yes   Patient has therapist preference:   []  Male         []  Male         SUBJECTIVE  Pain Level (0-10 scale):               Pain Level (0-10 scale) post treatment:   Medication Changes: []  No    []  Yes (see paper medication log)  Subjective functional status/changes:   []  No changes reported    The new medication is helping at night but doesn't seem to be helping during the day, woke up at 5 with spasms-had to walk around.     OBJECTIVE    []  Patient is post-surgical and on a strict surgical protocol that must be followed per MD instructions and to benefit the patient's successful recovery, so treatment started today    Modality: [x]  see flow sheet      Therapeutic Activities: [x]  see flow sheet     []  Other:_  Manual Therapy:  []  see flow sheet     []  Other:_    Patient Education: [x]  Review HEP    []  Progressed/Changed HEP  []  positioning   []  posture/body mechanics   []  transfers   []  heat/ice application    Other Objective/Functional Measures:  []  see evaluation/re-evaluation/progress         ASSESSMENT  []   See evaluation/progress note/reevaluation  Focused on increased flex and ext ROM with manual and exercises as below. Pt with good tolerance to session. Prone extension full/flex AROM 90/PROM 105. Sees his docThursday.       Short Term Goals: To be accomplished in 3 weeks:  1)      The patient will demonstrate independence with HEP.  2)      The patient will ambulate with proper mechanics during stance phase of gait with full terminal hip/knee extension and  4/5 strength >200 ft.  Long Term Goals: To be accomplished in 6 weeks:   1)      The patient will be able to return to all household and full work activities without pain as demonstrated by LEFS>60.  2)      The patient will ascend/descend 1 flight of stairs with reciprocal pattern secondary to full LE ROM and 5/5 strength.  3)      The patient will be able to stand for 60 minutes without pain secondary to strength 5/5 in order to perform woodworking.   []   Patient will continue to benefit from skilled therapy to address remaining goals     PLAN  [x]   Upgrade activities as tolerated      [x]  Frequency/duration: 3x/week for 6 weeks  POC ends (date): 11/09/18  [x]   Continue plan of care    []   Discharge due to:_         Rolland JONETTA Fam, PT  11/01/2018  Lower Extremity Therapy Flow Sheet  Shamar Engelmann     Classify Intervention   11 12 13 14 15 16 17 18 19 20    Date:  10/25/2018 10/27/2018 10/29/18 11/01/17         Non timed procedure              Evaluation/re-eval/  D/C              Moist heat/cold pack   MH/CP MH x10' supine x x x         Electrical Stimulation   ES             Timed procedure                STM/MFR MT Post knee in straight  incision and quad in flex x x Distal quad/incision in full flex         Joint mobilization MT p patella X inf/sup x          Manual stretching/PassiveROM MT X ext in supine, flex in supine and seated x 10 each x x Ext/flex  Both supine/prone         Ultrasound US              Exercise/Activity              Quad set TE 20 x 10 x Heel on foam Foam  20/10         Heel slides TE X 20 with strap, foot on ball X no ball 10x10 Ball/strap  X10/10         UBKFO TE Black x 20 x  BLK  tband  x20ea         SLR TE X 10 x  2x10         SAQ/LAQ TE SAQ x 2 p  LAQ x 20 X 20 LAQ  1.5# both x10ea  Focus quad set         Prone knee hang TE 2' x  x2'         Sidelying hip abd/clams TE p            Prone ham curls TE X 20 towel  under thigh x  x20         Strap Hamstring/Quad stretch TE 3 x 30 each x  x         Stair Stretches TE  flex, ext 10 x 10 each x xx xx         Standing alternating-hip abd, hip ext TA Red x 20 each x           Minisquats/HR TA X 20 each x  x20ea         TKE TA   Green 10x10 BTB  20/10         Sidestepping TA p            Ascending/descending stairs reciprocally TA             Forward step ups/Lateral Step ups TA 8 x 20 each x  Both  x20 ea         Forward step downs>dips TA 4 x 20 x  x         Cable column/Totalgym TE             LegPress TE             Recumbent bike/elliptical TE Seat 11 x 10 x Seat 9 for flex Seat  10  x10'         Gait training GT             HEP updated              Gait training 02883 GT              Moist Heat/Cold Pack 97010   EStim 97014     MH/CP    ES 10 10 10 10          Manual Therapy 97140 MT 15 15 15 15          Ultrasound 97035  US              Therapeutic exercise 97110 TE 35 35 25 30         Therapeutic Activity 97530 TA 15 15 5 10          Neuromuscular Reeducation 97112  NMR             TOTAL TREATMENT TIME  (untimed+timed minutes)  95 85 55 65         TIMED CODE TREATMENT  (timed code minutes-  shared minutes)  65 65 45 55         ** Medicare-add KX at 5th visit if PT&ST/10h visit if PT only**           Add KX    Therapist's Initials  AW AW KR HDB                   Lower Extremity Therapy Flow Sheet  Ardian Haberland     Classify Intervention   1 2 3 4 5 6 7 8 9 10    Date:  09/28/18 09/30/18 10/02/2018 10/04/2018 10/11/18 10/13/2018 10/15/18 10/18/18 10/20/2018 10/22/18   Non timed procedure              Evaluation/re-eval/  D/C  IE            Moist heat/cold pack   MH/CP MH x10' supine BOS and EOS  bolster under knee BOS xx xx X    Declined EOS x x x x declined   Electrical Stimulation   ES             Timed procedure                STM/MFR MT p scar (once steri strips off), retro for swelling Retro knee for swelling X plus quad in sitting x x X plus GT3 x Post knee in straight  incision  and quad in flex x x   Joint mobilization MT p patella x x x x x x x     Manual stretching/PassiveROM MT p flex, ext x X flex in sitting with tibial IR and distraction x x X plus ext in supine x 10 each x Ext/flex in supine and prone X ext in supine, flex in supine and seated x 10 each xx   Ultrasound US              Exercise/Activity              Quad set TE 20x5 x x x x x x x  20x10 on 1/2 foam   Heel slides TE x20 with SOS x10 x X 20  x x x Blue pball under heel  With strap  20/5 x With SOS 15x10   Hip add ball/ Hip abd Tband TE p Ball squeeze 20x5    UBKF green x20 xx DC  ball sq        x UBKF green x20 x x UBKF  BTB  20/5   X black x   Bridges with ball squeeze TE p   X 20 x x x W/ ball  x20 DC    March>SLR TE p       SLR  10/3 x    SAQ/LAQ TE x20 ea xx xx X LAQ xx xx xx Both  x20ea xx 1# ea   Prone knee hang TE         2'    Sidelying hip abd/clams TE p            Prone ham curls TE p       x20  2 Towel folded thigh x    Strap Hamstring/Quad stretch TE p   2 x 30 HS X   X  Plus prone quad stretch 2 x 30 xx Calf/ham  2/30sec X HS and prone quad 3 x 30 each x   Stair Stretches TE p stair stretches flex, ext  10 x 5 each x 3x20 ea 10 x 5 3x20 ea x xx 10x10 ea   Standing alternating-  marches, ham curls, hip abd, hip ext TA p        X 20 ext, abd with red band    Minisquats/HR TA p   X 20  HR x20 x  x Xx plus mini squats    Tandem>SLS NMR p            Wobbleboard/BAPS NMR             TKE TA p       GTB  10/10  p   Sidestepping TA p            Ascending/descending stairs reciprocally TA   4 x 5          Forward step ups/Lateral Step ups TA p  FSU 6 x 10   FSU 6 x 10  LSU 6 x 10 X 20 x20 ea 8  xx Both 8  x20ea xx    Forward step downs>dips TA p     4 x 20 x FSD 4 x20 x    Cable column/Totalgym TE             LegPress TE             Recumbent bike/elliptical TE p RB seat 12 rocking x8' X seat 11 x Full rev x10' x x M2 seat 11  x10 x BOS   Treadmill/Elliptical TE                           Gait  training GT             HEP updated  As above; printout provided    Educated on swelling and elevation            Gait training 02883 GT              Moist Heat/Cold Pack 97010   EStim 97014     MH/CP    ES  20 20 20 10 10 10 10 10     Manual Therapy 97140 MT  20 15 15 15 15 15 10 15 15    Ultrasound 97035  US              Therapeutic exercise 97110 TE 25 40 38 30 25 30  30  35 35 40   Therapeutic Activity 97530 TA   15 15 15 15 10 10 15     Neuromuscular Reeducation 97112  NMR             TOTAL TREATMENT TIME  (untimed+timed minutes)  55 80 88 90 65 70 65 65 85 55    TIMED CODE TREATMENT  (timed code minutes-  shared minutes)  25 60 68 60 55 60 55 55 65 55    ** Medicare-add KX at 5th visit if PT&ST/10h visit if PT only**           Add KX    Therapist's Initials  KR KR AW AW KR AW KR HDB AW KR

## 2018-11-03 ENCOUNTER — Inpatient Hospital Stay: Admit: 2018-11-03 | Payer: PRIVATE HEALTH INSURANCE | Primary: Family Medicine

## 2018-11-03 NOTE — Progress Notes (Signed)
[][facility], [place], [place], [place] [phone]   Fax [phone]    [x]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 Spasms are bad again; even at night the new medication is not helping. He goes to see his surgeon tomorrow. He has to think before he does anything and still has difficulty with most activities. He sees improvement and things have gotten easier, but not nearly as much as he expected.     Assessment: The patient has been compliant with therapy attendance, along with compliant participation with his home exercise program. He has made moderate change in his functional status and his goal progress can be seen below. Certain goals have not be attained secondary to cont severe spasms in his LLE that are limiting his range of motion. Until the spasms are under control, PT is concerned that pt will cont to have difficulty increasing ROM; with limited flexion ROM he will cont to have difficulty with functional mobility.     Goals: Status Initial Eval: Status last Re-eval/ Progress: Current   Status: Goals Met?     1. The patient will demonstrate independence with HEP. initiated  Pt performs exercises 1-2x/day yes    2. The patient will ambulate with proper mechanics during stance phase of gait with full terminal hip/knee extension and 4/5 strength >200 ft. Antalgic, absent TKE, increased antalgia without AD  Uses his cane in the middle of the night if he has to get up; cont mod antalgia and absent TKE (no AD) progressing   3. The patient will be able to return to all household and full work activities without pain as demonstrated by LEFS>60. LEFS: 9/80; 89% limitation  LEFS: 40/80; 50% limitation progressing   4. The patient will ascend/descend 1 flight of stairs with reciprocal pattern secondary to full LE ROM and 5/5 strength. Step-to pattern on stairs with cane  Sometimes recpirocal pattern on stairs up, step-to pattern down progressing   5. The patient will be able to stand for 60 minutes without pain secondary to strength 5/5 in order to perform woodworking.     Unable to stand for a long time  Able to stand 45 min without pain progressing     Functional Outcome Measures:   LEFS score 9/80; 89% limitation to 40/80; 50% limitation  ??  Girth Measurements:??  ?? ????????joint line ??10 cm above jt line ??10 cm below jt line   Left 37.6 cm to 37.2 cm 37.4 cm to 36.8 cm 31.6 cm to 30.9 cm   ??  ROM /  Strength  []????Unable to assess????????????????????????????????????AROM ??????????????????????????????????????????PROM ????????????????????????????????????Strength (1-5)  ?? ?? Left 11/03/18 Right Left 11/03/18 Right Left 11/03/18 Right 11/03/18   Hip Flexion ??  ?? ??  ?? _0 ?? Abduction ??  ?? ??  ?? _1 4+   ?? Adduction ??  ?? ??  ?? 4+ 4+ 4+ 5   Knee Flexion 72 100 132 75 105 ?? 3- 4+** 5 5   ?? Extension -3 -3 0 ??  ?? 3- 4+** 5 5   Ankle Plantarflexion ??  ?? ??  ?? _2 ?? Dorsiflexion ??  ?? ??  ?? 4 4+ 5 5   ?? Inversion ??  ?? ??  ?? 4+ _3 ?? Eversion ??  ?? ??  ?? 4+ _4 ??????????????????????????????????????????????????????????????????????????????????[]????all WNLs/WFLs ????????[]????all WNLs/WFLs ????????????[]????all at least 4/5  **within available range. ??    Rhomberg EC:??30 sec    Tandem L: 30 sec, mod sway  Tandem R: 30 sec, min sway     SLS L: 30 sec, mod sway, pain  SLS R: 30 sec, min sway      Recommendations:  The patient will benefit from continued skilled therapy intervention to address the below problem list and updated goals in order to increase functional mobility, activities of daily living and quality of life.  Problem List: pain affecting function, decrease ROM, decrease strength, impaired gait/ balance, decrease ADL/ functional abilitiies, decrease activity tolerance and decrease flexibility/ joint mobility    Updated Patient Goals:   [x] Continue with the above unachieved goals   [] Additional goals:    Plan:  Treatment Plan: Therapeutic exercise, Therapeutic activities, Neuromuscular re-education, Physical agent/modality, Gait/balance training, Manual therapy and Patient education  Frequency / Duration:  [] Continue with original POC                                         [x] Benefit from 4 additional weeks: 12/01/18           [place] 11/03/2018 8:48 AM  ______________________________________________________________________  I certify that the above Therapy Services are being furnished while the patient is under my care. I agree with the treatment plan and certify that this therapy is necessary.  [] I have read the above and request that my patient continue as recommended  [] I have read the above report and request that my patient continue therapy with the following changes/special instructions:   [] I have read the above report and request that my patient be discharged from therapy  Physician's Signature:________________________Date:________Time:_________  Please sign and FAX back to appropriate hospital.

## 2018-11-03 NOTE — Progress Notes (Signed)
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      OBJECTIVE    []  Patient is post-surgical and on a strict surgical protocol that must be followed per MD instructions and to benefit the patient's successful recovery, so treatment started today    Modality: [x]  see flow sheet      Therapeutic Activities: [x]  see flow sheet     []  Other:_  Manual Therapy:  []  see flow sheet     []  Other:_    Patient Education: [x]  Review HEP    []  Progressed/Changed HEP  []  positioning   []  posture/body mechanics   []  transfers   []  heat/ice application    Other Objective/Functional Measures:  [x]  see evaluation/re-evaluation/progress         ASSESSMENT  [x]   See evaluation/progress note/reevaluation  Pt demonstrates moderate increase in flex and no change in ext ROM. He sees his surgeon tomorrow and will discuss options for treating spasms.     Short Term Goals: To be accomplished in 3 weeks:  1)      The patient will demonstrate independence with HEP.  2)      The patient will ambulate with proper mechanics during stance phase of gait with full terminal hip/knee extension and 4/5 strength >200 ft.  Long Term Goals: To be accomplished in 6 weeks:   1)      The patient will be able to return to all household and full work  activities without pain as demonstrated by LEFS>60.  2)      The patient will ascend/descend 1 flight of stairs with reciprocal pattern secondary to full LE ROM and 5/5 strength.  3)      The patient will be able to stand for 60 minutes without pain secondary to strength 5/5 in order to perform woodworking.   []   Patient will continue to benefit from skilled therapy to address remaining goals     PLAN  [x]   Upgrade activities as tolerated      [x]  Frequency/duration: 3x/week for 6 weeks  POC ends (date): 12/01/18  [x]   Continue plan of care    []   Discharge due to:_         Tresa Endo L Kaleya Douse  11/03/2018  Lower Extremity Therapy Flow Sheet  Joel Graham     Classify Intervention   11 12 13 14 15 16 17 18 19 20    Date:  10/25/2018 10/27/2018 10/29/18 11/01/17 11/03/18        Non timed procedure              Evaluation/re-eval/  D/C      RE        Moist heat/cold pack   MH/CP MH x10' supine x x x         Electrical Stimulation   ES             Timed procedure                STM/MFR MT Post knee in straight  incision and quad in flex x x Distal quad/incision in full flex         Joint mobilization MT p patella X inf/sup x          Manual stretching/PassiveROM MT X ext in supine, flex in supine and seated x 10 each x x Ext/flex  Both supine/prone         Ultrasound [place]             Exercise/Activity              Quad set TE 20 x 10" x Heel on foam Foam  20/10"         Heel slides TE X 20 with strap, foot on ball X no ball 10x10" Ball/strap  X10/10"         UBKFO TE Black x 20 x  BLK  tband  x20ea         SLR TE X 10 x  2x10         SAQ/LAQ TE SAQ x 2 p  LAQ x 20 X 20 LAQ  1.5# both x10ea  Focus quad set         Prone knee hang TE 2' x  x2'         Sidelying hip abd/clams TE p            Prone ham curls TE X 20 towel under thigh x  x20         Strap Hamstring/Quad stretch TE 3 x 30" each x  x         Stair Stretches TE  flex, ext 10 x 10" each x xx xx xx         Standing alternating-hip abd, hip ext TA Red x 20 each x           Minisquats/HR TA X 20 each x  x20ea         TKE TA   Green 10x10" BTB  20/10" x        Sidestepping TA p            Ascending/descending stairs reciprocally TA             Forward step ups/Lateral Step ups TA 8" x 20 each x  Both  x20 ea         Forward step downs>dips TA 4" x 20 x  x         Cable column/Totalgym TE             LegPress TE             Recumbent bike/elliptical TE Seat 11 x 10 x Seat 9 for flex Seat  10  x10' Seat 9 for flex        Gait training GT             HEP updated              Gait training 72094 GT              Moist Heat/Cold Pack 97010   EStim 97014     MH/CP    ES 10 10 10 10          Manual Therapy 97140 MT 15 15 15 15          Ultrasound 97035  [place]             Therapeutic exercise 97110 TE 35 35 25 30 20         Therapeutic Activity 97530 TA 15 15 5 10          Neuromuscular Reeducation 97112  NMR             TOTAL TREATMENT TIME  (untimed+timed minutes)  95 85 55 65 40        TIMED CODE TREATMENT  (timed code minutes-  shared minutes)  65 65 45 55 20        ** Medicare-add KX at 5th visit if PT&ST/10h visit if PT only**           Add KX    Therapist's Initials  AW AW KR HDB KR                  Lower Extremity Therapy Flow Sheet  Joel Graham     Classify Intervention   1 2 3 4 5 6 7 8 9 10    Date:  09/28/18 09/30/18 10/02/2018 10/04/2018 10/11/18 10/13/2018 10/15/18 10/18/18 10/20/2018 10/22/18   Non timed procedure              Evaluation/re-eval/  D/C  IE            Moist heat/cold pack   MH/CP MH x10' supine BOS and EOS  bolster under knee BOS xx xx X    Declined EOS x x x x declined   Electrical Stimulation   ES             Timed procedure                STM/MFR MT p scar (once steri strips off), retro for swelling Retro knee for swelling X plus quad in sitting x x X plus GT3 x Post knee in straight  incision and quad in flex x x   Joint mobilization MT p patella x x x x x x x      Manual stretching/PassiveROM MT p flex, ext x X flex in sitting with tibial IR and distraction x x X plus ext in supine x 10 each x Ext/flex in supine and prone X ext in supine, flex in supine and seated x 10 each xx   Ultrasound [place]             Exercise/Activity              Quad set TE 20x5" x x x x x x x  20x10" on 1/2 foam   Heel slides TE x20 with SOS x10 x X 20  x x x Blue pball under heel  With strap  20/5" x With SOS 15x10"   Hip add ball/ Hip abd Tband TE p Ball squeeze 20x5"    UBKF green  x20 xx DC ball sq        x UBKF green x20 x x UBKF  BTB  20/5"   X black x   Bridges with ball squeeze TE p   X 20 x x x W/ ball  x20 DC    March>SLR TE p       SLR  10/3" x    SAQ/LAQ TE x20 ea xx xx X LAQ xx xx xx Both  x20ea xx 1# ea   Prone knee hang TE         2'    Sidelying hip abd/clams TE p            Prone ham curls TE p       x20  2 Towel folded thigh x    Strap Hamstring/Quad stretch TE p   2 x 30" HS X   X  Plus prone quad stretch 2 x 30" xx Calf/ham  2/30sec X HS and prone quad 3 x 30" each x   Stair Stretches TE p stair stretches flex, ext  10 x 5" each x 3x20" ea 10 x 5" 3x20" ea x xx 10x10" ea   Standing alternating-  marches, ham curls, hip abd, hip ext TA p        X 20 ext, abd with red band    Minisquats/HR TA p   X 20  HR x20 x  x Xx plus mini squats    Tandem>SLS NMR p            Wobbleboard/BAPS NMR             TKE TA p       GTB  10/10"  p   Sidestepping TA p            Ascending/descending stairs reciprocally TA   4" x 5          Forward step ups/Lateral Step ups TA p  FSU 6" x 10   FSU 6" x 10  LSU 6" x 10 X 20 x20 ea 8"  xx Both 8"  x20ea xx    Forward step downs>dips TA p     4" x 20 x FSD 4" x20 x    Cable column/Totalgym TE             LegPress TE             Recumbent bike/elliptical TE p RB seat 12 rocking x8' X seat 11 x Full rev x10' x x M2 seat 11  x10 x BOS   Treadmill/Elliptical TE                           Gait training GT             HEP updated  As above; printout provided     Educated on swelling and elevation            Gait training [id number] GT              Moist Heat/Cold Pack 97010   EStim 97014     MH/CP    ES  20 20 20 10 10 10 10 10     Manual Therapy 97140 MT  20 15 15 15 15 15 10 15 15    Ultrasound 97035  US             Therapeutic exercise 97110 TE 25 40 38 30  25 30 30  35 35 40   Therapeutic Activity 97530 TA   15 15 15 15 10 10 15     Neuromuscular Reeducation 97112  NMR             TOTAL TREATMENT TIME  (untimed+timed minutes)  55 80 88 90 65 70 65 65 85 55    TIMED CODE TREATMENT  (timed code minutes-  shared minutes)  25 60 68 60 55 60 55 55 65 55    ** Medicare-add KX at 5th visit if PT&ST/10h visit if PT only**           Add KX    Therapist's Initials  KR KR AW AW KR AW KR HDB AW KR

## 2018-11-03 NOTE — Progress Notes (Signed)
 [] [facility], [place], [place], Ph [phone]   Fax [phone]    [x] [address] [place], [place]  Phone [phone]  Fax [phone]    [] [facility], [place], [place]: [phone]  Fax: [phone]  ______________________________________________________________________                    RE-EVALUATION/PROGRESS NOTE Joel PLAN OF CARE FOR                  PHYSICAL/OCCUPATIONAL/SPEECH Graham      Joel Graham        Date: 11/03/2018   DOB: 12-20-1950  [name], MD  Diagnosis Codes: Difficulty in walking, not elsewhere classified [R26.2]  Pain in left knee [M25.562]  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 Spasms are bad again; even at night the new medication is not helping. He goes to see his surgeon tomorrow. He has to think before he does anything and still has difficulty with most activities. He sees improvement and things have gotten easier, but not nearly as much as he expected.     Assessment: The patient has been compliant with Graham attendance, along with compliant participation with his home exercise program. He has made moderate change in his functional status and his goal progress can be seen below. Certain goals have not be attained secondary to cont severe spasms in his LLE that are limiting his range of motion. Until the spasms are under control, PT is concerned that pt will cont to have difficulty increasing ROM; with limited flexion ROM he will cont to have difficulty with functional mobility.     Goals: Status Initial Eval: Status last Re-eval/ Progress: Current   Status: Goals Met?     1. The patient will demonstrate independence with HEP. initiated  Pt performs exercises 1-2x/day yes    2. The patient will ambulate with proper mechanics during stance phase of gait with full terminal hip/knee extension and 4/5 strength >200 ft. Antalgic, absent TKE, increased antalgia without AD  Uses his cane in the middle of the night if he has to get up; cont mod antalgia and absent TKE (no AD) progressing   3. The patient will be able to return to all household and full work activities without pain as demonstrated by LEFS>60. LEFS: 9/80; 89% limitation  LEFS: 40/80; 50% limitation progressing   4. The patient will ascend/descend 1 flight of stairs with reciprocal pattern secondary to full LE ROM and 5/5 strength. Step-to pattern on stairs with cane  Sometimes recpirocal pattern on stairs up, step-to pattern down progressing   5. The patient will be able to stand for 60 minutes without pain secondary to strength 5/5 in order to perform woodworking.     Unable to stand for a long time  Able to stand 45 min without pain progressing     Functional Outcome Measures:   LEFS score 9/80; 89% limitation to 40/80; 50% limitation    Girth Measurements:   joint line 10 cm above jt line 10 cm below jt line   Left 37.6 cm to 37.2 cm 37.4 cm to 36.8 cm 31.6 cm to 30.9 cm     ROM /  Strength  [] ??Unable to assessAROM PROM Strength (1-5)    Left 11/03/18 Right Left 11/03/18 Right Left 11/03/18 Right 11/03/18   Hip Flexion       4 5 4 5     Abduction       4 4 4  4+    Adduction       4+ 4+ 4+ 5   Knee Flexion 72 100 132 75 105  3- 4+** 5 5    Extension -3 -3 0    3- 4+** 5 5   Ankle Plantarflexion       5 5 5 5     Dorsiflexion       4 4+ 5 5    Inversion       4+ 5 5 5     Eversion       4+ 5 5 5    [] ??all WNLs/WFLs [] ??all WNLs/WFLs [] ??all at least 4/5  **within available range.     Rhomberg EC:30 sec    Tandem L: 30 sec, mod sway  Tandem R: 30 sec, min sway    SLS L: 30  sec, mod sway, pain  SLS R: 30 sec, min sway      Recommendations:  The patient will benefit from continued skilled Graham intervention to address the below problem list and updated goals in order to increase functional mobility, activities of daily living and quality of life.  Problem List: pain affecting function, decrease ROM, decrease strength, impaired gait/ balance, decrease ADL/ functional abilitiies, decrease activity tolerance and decrease flexibility/ joint mobility    Updated Patient Goals:   [x]  Continue with the above unachieved goals   []  Additional goals:    Plan:  Treatment Plan: Therapeutic exercise, Therapeutic activities, Neuromuscular re-education, Physical agent/modality, Gait/balance training, Manual Graham and Patient education  Frequency / Duration:  []  Continue with original POC                                         [x]  Benefit from 4 additional weeks: 12/01/18           Burnard CROME Raquet 11/03/2018 8:48 AM  ______________________________________________________________________  I certify that the above Graham Services are being furnished while the patient is under my care. I agree with the treatment plan and certify that this Graham is necessary.  []  I have read the above and request that my patient continue as recommended  []  I have read the above report and request that my patient continue Graham with the following changes/special instructions:   []  I have read the above report and request that my patient be discharged from Graham  Physician's Signature:________________________Date:________Time:_________  Please sign and FAX back to appropriate hospital.

## 2018-11-03 NOTE — Progress Notes (Signed)
 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      OBJECTIVE    []  Patient is post-surgical and on a strict surgical protocol that must be followed per MD instructions and to benefit the patient's successful recovery, so treatment started today    Modality: [x]  see flow sheet      Therapeutic Activities: [x]  see flow sheet     []  Other:_  Manual Therapy:  []  see flow sheet     []  Other:_    Patient Education: [x]  Review HEP    []  Progressed/Changed HEP  []  positioning   []  posture/body mechanics   []  transfers   []  heat/ice application    Other Objective/Functional Measures:  [x]  see evaluation/re-evaluation/progress         ASSESSMENT  [x]   See evaluation/progress note/reevaluation  Pt demonstrates moderate increase in flex and no change in ext ROM. He sees his surgeon tomorrow and will discuss options for treating spasms.     Short Term Goals: To be accomplished in 3 weeks:  1)      The patient will demonstrate independence with HEP.  2)      The patient will ambulate with proper mechanics during stance phase of gait with full terminal hip/knee extension and 4/5 strength >200 ft.  Long Term Goals: To be accomplished in 6 weeks:   1)      The patient will be able to return to all household and full work  activities without pain as demonstrated by LEFS>60.  2)      The patient will ascend/descend 1 flight of stairs with reciprocal pattern secondary to full LE ROM and 5/5 strength.  3)      The patient will be able to stand for 60 minutes without pain secondary to strength 5/5 in order to perform woodworking.   []   Patient will continue to benefit from skilled therapy to address remaining goals     PLAN  [x]   Upgrade activities as tolerated      [x]  Frequency/duration: 3x/week for 6 weeks  POC ends (date): 12/01/18  [x]   Continue plan of care    []   Discharge due to:_         Joel Graham  11/03/2018  Lower Extremity Therapy Flow Sheet  Joel Graham     Classify Intervention   11 12 13 14 15 16 17 18 19 20    Date:  10/25/2018 10/27/2018 10/29/18 11/01/17 11/03/18        Non timed procedure              Evaluation/re-eval/  D/C      RE        Moist heat/cold pack   MH/CP MH x10' supine x x x         Electrical Stimulation   ES             Timed procedure                STM/MFR MT Post knee in straight  incision and quad in flex x x Distal quad/incision in full flex         Joint mobilization MT p patella X inf/sup x          Manual stretching/PassiveROM MT X ext in supine, flex in supine and seated x 10 each x x Ext/flex  Both supine/prone         Ultrasound US              Exercise/Activity              Quad set TE 20 x 10 x Heel on foam Foam  20/10         Heel slides TE X 20 with strap, foot on ball X no ball 10x10 Ball/strap  X10/10         UBKFO TE Black x 20 x  BLK  tband  x20ea         SLR TE X 10 x  2x10         SAQ/LAQ TE SAQ x 2 p  LAQ x 20 X 20 LAQ  1.5# both x10ea  Focus quad set         Prone knee hang TE 2' x  x2'         Sidelying hip abd/clams TE p            Prone ham curls TE X 20 towel under thigh x  x20         Strap Hamstring/Quad stretch TE 3 x 30 each x  x         Stair Stretches TE  flex, ext 10 x 10 each x xx xx xx         Standing alternating-hip abd, hip ext TA Red x 20 each x           Minisquats/HR TA X 20 each x  x20ea         TKE TA   Green 10x10 BTB  20/10 x        Sidestepping TA p            Ascending/descending stairs reciprocally TA             Forward step ups/Lateral Step ups TA 8 x 20 each x  Both  x20 ea         Forward step downs>dips TA 4 x 20 x  x         Cable column/Totalgym TE             LegPress TE             Recumbent bike/elliptical TE Seat 11 x 10 x Seat 9 for flex Seat  10  x10' Seat 9 for flex        Gait training GT             HEP updated              Gait training 02883 GT              Moist Heat/Cold Pack 97010   EStim 97014     MH/CP    ES 10 10 10 10          Manual Therapy 97140 MT 15 15 15 15          Ultrasound 97035  US              Therapeutic exercise 97110 TE 35 35 25 30 20         Therapeutic Activity 97530 TA 15 15 5 10          Neuromuscular Reeducation 97112  NMR             TOTAL TREATMENT TIME  (untimed+timed minutes)  95 85 55 65 40        TIMED CODE TREATMENT  (timed code minutes-  shared minutes)  65 65 45 55 20        ** Medicare-add KX at 5th visit if PT&ST/10h visit if PT only**           Add KX    Therapist's Initials  AW AW KR HDB KR                  Lower Extremity Therapy Flow Sheet  Joel Graham     Classify Intervention   1 2 3 4 5 6 7 8 9 10    Date:  09/28/18 09/30/18 10/02/2018 10/04/2018 10/11/18 10/13/2018 10/15/18 10/18/18 10/20/2018 10/22/18   Non timed procedure              Evaluation/re-eval/  D/C  IE            Moist heat/cold pack   MH/CP MH x10' supine BOS and EOS  bolster under knee BOS xx xx X    Declined EOS x x x x declined   Electrical Stimulation   ES             Timed procedure                STM/MFR MT p scar (once steri strips off), retro for swelling Retro knee for swelling X plus quad in sitting x x X plus GT3 x Post knee in straight  incision and quad in flex x x   Joint mobilization MT p patella x x x x x x x     Manual stretching/PassiveROM MT p flex, ext x X  flex in sitting with tibial IR and distraction x x X plus ext in supine x 10 each x Ext/flex in supine and prone X ext in supine, flex in supine and seated x 10 each xx   Ultrasound US              Exercise/Activity              Quad set TE 20x5 x x x x x x x  20x10 on 1/2 foam   Heel slides TE x20 with SOS x10 x X 20  x x x Blue pball under heel  With strap  20/5 x With SOS 15x10   Hip add ball/ Hip abd Tband TE p Ball squeeze 20x5    UBKF green  x20 xx DC ball sq        x UBKF green x20 x x UBKF  BTB  20/5   X black x   Bridges with ball squeeze TE p   X 20 x x x W/ ball  x20 DC    March>SLR TE p       SLR  10/3 x    SAQ/LAQ TE x20 ea xx xx X LAQ xx xx xx Both  x20ea xx 1# ea   Prone knee hang TE         2'    Sidelying hip abd/clams TE p            Prone ham curls TE p       x20  2 Towel folded thigh x    Strap Hamstring/Quad stretch TE p   2 x 30 HS X   X  Plus prone quad stretch 2 x 30 xx Calf/ham  2/30sec X HS and prone quad 3 x 30 each x   Stair Stretches TE p stair stretches flex, ext  10 x 5 each x 3x20 ea 10 x 5 3x20 ea x xx 10x10 ea   Standing alternating-  marches, ham curls, hip abd, hip ext TA p        X 20 ext, abd with red band    Minisquats/HR TA p   X 20  HR x20 x  x Xx plus mini squats    Tandem>SLS NMR p            Wobbleboard/BAPS NMR             TKE TA p       GTB  10/10  p   Sidestepping TA p            Ascending/descending stairs reciprocally TA   4 x 5          Forward step ups/Lateral Step ups TA p  FSU 6 x 10   FSU 6 x 10  LSU 6 x 10 X 20 x20 ea 8  xx Both 8  x20ea xx    Forward step downs>dips TA p     4 x 20 x FSD 4 x20 x    Cable column/Totalgym TE             LegPress TE             Recumbent bike/elliptical TE p RB seat 12 rocking x8' X seat 11 x Full rev x10' x x M2 seat 11  x10 x BOS   Treadmill/Elliptical TE                           Gait training GT             HEP updated  As above; printout provided    Educated on swelling and elevation            Gait  training 02883 GT              Moist Heat/Cold Pack 97010   EStim 97014     MH/CP    ES  20 20 20 10 10 10 10 10     Manual Therapy 97140 MT  20 15 15 15 15 15 10 15 15    Ultrasound 97035  US              Therapeutic exercise 97110 TE 25 40 38 30  25 30 30  35 35 40   Therapeutic Activity 97530 TA   15 15 15 15 10 10 15     Neuromuscular Reeducation 97112  NMR             TOTAL TREATMENT TIME  (untimed+timed minutes)  55 80 88 90 65 70 65 65 85 55    TIMED CODE TREATMENT  (timed code minutes-  shared minutes)  25 60 68 60 55 60 55 55 65 55    ** Medicare-add KX at 5th visit if PT&ST/10h visit if PT only**           Add KX    Therapist's Initials  KR KR AW AW KR AW KR HDB AW KR

## 2018-11-05 ENCOUNTER — Inpatient Hospital Stay: Admit: 2018-11-05 | Payer: PRIVATE HEALTH INSURANCE | Primary: Family Medicine

## 2018-11-05 NOTE — Progress Notes (Signed)
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]   Referral Source: Joycie Peek., MD     Date:11/05/2018  Visit #: 16     Treatment Area: L TKR  Date of Surgery: 09/08/18  Protocol  []  Yes  Precautions/Restrictions:  Patients prefers to be treated behind curtain when appropriate:  []  Yes   Patient has therapist preference:   []  Male         []  Male         SUBJECTIVE  Pain Level (0-10 scale):               Pain Level (0-10 scale) post treatment:   Medication Changes: []  No    []  Yes (see paper medication log)  Subjective functional status/changes:   []  No changes reported    Pt saw his surgeon yesterday who said he could manipulate him but he doesn't recommend it because it could just set things off again. He thinks it will just take time to resolve, like 6 months, and to keep going with therapy as best as he can. Bill increased his CBD dose to see if that will help.      OBJECTIVE    []  Patient is post-surgical and on a strict surgical protocol that must be followed per MD instructions and to benefit the patient's successful recovery, so treatment started today    Modality: [x]  see flow sheet      Therapeutic Activities: [x]  see flow sheet     []  Other:_  Manual Therapy:  []  see flow sheet     []  Other:_    Patient Education: [x]  Review HEP    []  Progressed/Changed HEP  []  positioning   []  posture/body mechanics   []  transfers   []  heat/ice application    Other Objective/Functional Measures:  []  see evaluation/re-evaluation/progress         ASSESSMENT  []   See evaluation/progress note/reevaluation  Knee flex AROM after stretching: 107, PROM: 111. Cont to focus on increasing ROM with exercises below.    Short Term Goals: To be accomplished in 3 weeks:  1)      The patient will demonstrate independence with HEP.   2)      The patient will ambulate with proper mechanics during stance phase of gait with full terminal hip/knee extension and 4/5 strength >200 ft.  Long Term Goals: To be accomplished in 6 weeks:   1)      The patient will be able to return to all household and full work activities without pain as demonstrated by LEFS>60.  2)      The patient will ascend/descend 1 flight of stairs with reciprocal pattern secondary to full LE ROM and 5/5 strength.  3)      The patient will be able to stand for 60 minutes without pain secondary to strength 5/5 in order to perform woodworking.   []   Patient will continue to benefit from skilled therapy to address remaining goals     PLAN  [x]   Upgrade activities as tolerated      [x]  Frequency/duration: 3x/week for 6 weeks  POC ends (date): 12/01/18  [x]   Continue plan of care    []   Discharge due to:_         Tresa Endo L Gerald Kuehl  11/05/2018  Lower Extremity Therapy Flow Sheet  Joel Graham     Classify Intervention   11 12 13 14 15 16 17 18 19 20    Date:  10/25/2018 10/27/2018 10/29/18 11/01/17 11/03/18 11/05/18       Non timed procedure              Evaluation/re-eval/  D/C      RE        Moist heat/cold pack   MH/CP MH x10' supine x x x  x       Electrical Stimulation   ES             Timed procedure                STM/MFR MT Post knee in straight  incision and quad in flex x x Distal quad/incision in full flex         Joint mobilization MT p patella X inf/sup x          Manual stretching/PassiveROM MT X ext in supine, flex in supine and seated x 10 each x x Ext/flex  Both supine/prone  x       Ultrasound [place]             Exercise/Activity              Quad set TE 20 x 10" x Heel on foam Foam  20/10"  x       Heel slides TE X 20 with strap, foot on ball X no ball 10x10" Ball/strap  X10/10"  x       UBKFO TE Black x 20 x  BLK  tband  x20ea         SLR TE X 10 x  2x10         SAQ/LAQ TE SAQ x 2 p   LAQ x 20 X 20 LAQ  1.5# both x10ea  Focus quad set  2# ea       Prone knee hang TE 2' x  x2'         Sidelying hip abd/clams TE p            Prone ham curls TE X 20 towel under thigh x  x20         Strap Hamstring/Quad stretch TE 3 x 30" each x  x         Stair Stretches TE  flex, ext 10 x 10" each x xx xx xx xx       Standing alternating-hip abd, hip ext TA Red x 20 each x           Minisquats/HR TA X 20 each x  x20ea         TKE TA   Green 10x10" BTB  20/10" x x       Sidestepping TA p            Ascending/descending stairs reciprocally TA             Forward step ups/Lateral Step ups TA 8" x 20 each x  Both  x20 ea         Forward step downs>dips TA 4" x 20 x  x         Cable column/Totalgym TE             LegPress TE             Recumbent bike/elliptical TE Seat 11 x 10 x Seat 9 for flex  Seat 10  x10' Seat 9 for flex X EOS       Gait training GT             HEP updated              Gait training 19147 GT              Moist Heat/Cold Pack 97010   EStim 97014     MH/CP    ES 10 10 10 10  10        Manual Therapy 97140 MT 15 15 15 15  15        Ultrasound 97035  [place]             Therapeutic exercise 97110 TE 35 35 25 30 20 30        Therapeutic Activity 97530 TA 15 15 5 10          Neuromuscular Reeducation 97112  NMR             TOTAL TREATMENT TIME  (untimed+timed minutes)  95 85 55 65 40 55       TIMED CODE TREATMENT  (timed code minutes-  shared minutes)  65 65 45 55 20 45       ** Medicare-add KX at 5th visit if PT&ST/10h visit if PT only**           Add KX    Therapist's Initials  AW AW KR HDB KR KR                 Lower Extremity Therapy Flow Sheet  Joel Graham     Classify Intervention   1 2 3 4 5 6 7 8 9 10    Date:  09/28/18 09/30/18 10/02/2018 10/04/2018 10/11/18 10/13/2018 10/15/18 10/18/18 10/20/2018 10/22/18   Non timed procedure              Evaluation/re-eval/  D/C  IE            Moist heat/cold pack   MH/CP MH x10' supine BOS and EOS  bolster under knee BOS xx xx X    Declined EOS x x x x declined    Electrical Stimulation   ES             Timed procedure                STM/MFR MT p scar (once steri strips off), retro for swelling Retro knee for swelling X plus quad in sitting x x X plus GT3 x Post knee in straight  incision and quad in flex x x   Joint mobilization MT p patella x x x x x x x     Manual stretching/PassiveROM MT p flex, ext x X flex in sitting with tibial IR and distraction x x X plus ext in supine x 10 each x Ext/flex in supine and prone X ext in supine, flex in supine and seated x 10 each xx   Ultrasound [place]             Exercise/Activity              Quad set TE 20x5" x x x x x x x  20x10" on 1/2 foam   Heel slides TE x20 with SOS x10 x X 20  x x x Blue pball under heel  With strap  20/5" x With SOS 15x10"   Hip add ball/ Hip abd Tband TE p Ball squeeze 20x5"  UBKF green x20 xx DC ball sq        x UBKF green x20 x x UBKF  BTB  20/5"   X black x   Bridges with ball squeeze TE p   X 20 x x x W/ ball  x20 DC    March>SLR TE p       SLR  10/3" x    SAQ/LAQ TE x20 ea xx xx X LAQ xx xx xx Both  x20ea xx 1# ea   Prone knee hang TE         2'    Sidelying hip abd/clams TE p            Prone ham curls TE p       x20  2 Towel folded thigh x    Strap Hamstring/Quad stretch TE p   2 x 30" HS X   X  Plus prone quad stretch 2 x 30" xx Calf/ham  2/30sec X HS and prone quad 3 x 30" each x   Stair Stretches TE p stair stretches flex, ext  10 x 5" each x 3x20" ea 10 x 5" 3x20" ea x xx 10x10" ea   Standing alternating-  marches, ham curls, hip abd, hip ext TA p        X 20 ext, abd with red band    Minisquats/HR TA p   X 20  HR x20 x  x Xx plus mini squats    Tandem>SLS NMR p            Wobbleboard/BAPS NMR             TKE TA p       GTB  10/10"  p   Sidestepping TA p            Ascending/descending stairs reciprocally TA   4" x 5          Forward step ups/Lateral Step ups TA p  FSU 6" x 10   FSU 6" x 10  LSU 6" x 10 X 20 x20 ea 8"  xx Both 8"  x20ea xx     Forward step downs>dips TA p     4" x 20 x FSD 4" x20 x    Cable column/Totalgym TE             LegPress TE             Recumbent bike/elliptical TE p RB seat 12 rocking x8' X seat 11 x Full rev x10' x x M2 seat 11  x10 x BOS   Treadmill/Elliptical TE                           Gait training GT             HEP updated  As above; printout provided    Educated on swelling and elevation            Gait training 16109 GT              Moist Heat/Cold Pack 97010   EStim 97014     MH/CP    ES  20 20 20 10 10 10 10 10     Manual Therapy 97140 MT  20 15 15 15 15 15 10 15 15    Ultrasound 97035  [place]             Therapeutic exercise 97110 TE 25 40  38 30 25 30 30  35 35 40   Therapeutic Activity 97530 TA   15 15 15 15 10 10 15     Neuromuscular Reeducation 97112  NMR             TOTAL TREATMENT TIME  (untimed+timed minutes)  55 80 88 90 65 70 65 65 85 55    TIMED CODE TREATMENT  (timed code minutes-  shared minutes)  25 60 68 60 55 60 55 55 65 55    ** Medicare-add KX at 5th visit if PT&ST/10h visit if PT only**           Add KX    Therapist's Initials  KR KR AW AW KR AW KR HDB AW KR

## 2018-11-05 NOTE — Progress Notes (Signed)
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]   Referral Source: Joycie Peek., MD     Date:11/05/2018  Visit #: 16     Treatment Area: L TKR  Date of Surgery: 09/08/18  Protocol  []  Yes  Precautions/Restrictions:  Patients prefers to be treated behind curtain when appropriate:  []  Yes   Patient has therapist preference:   []  Male         []  Male         SUBJECTIVE  Pain Level (0-10 scale):               Pain Level (0-10 scale) post treatment:   Medication Changes: []  No    []  Yes (see paper medication log)  Subjective functional status/changes:   []  No changes reported    Pt saw his surgeon yesterday who said he could manipulate him but he doesn't recommend it because it could just set things off again. He thinks it will just take time to resolve, like 6 months, and to keep going with therapy as best as he can. Bill increased his CBD dose to see if that will help.      OBJECTIVE    []  Patient is post-surgical and on a strict surgical protocol that must be followed per MD instructions and to benefit the patient's successful recovery, so treatment started today    Modality: [x]  see flow sheet      Therapeutic Activities: [x]  see flow sheet     []  Other:_  Manual Therapy:  []  see flow sheet     []  Other:_    Patient Education: [x]  Review HEP    []  Progressed/Changed HEP  []  positioning   []  posture/body mechanics   []  transfers   []  heat/ice application    Other Objective/Functional Measures:  []  see evaluation/re-evaluation/progress         ASSESSMENT  []   See evaluation/progress note/reevaluation  Knee flex AROM after stretching: 107, PROM: 111. Cont to focus on increasing ROM with exercises below.    Short Term Goals: To be accomplished in 3 weeks:  1)      The patient will demonstrate independence with HEP.  2)      The patient will ambulate  with proper mechanics during stance phase of gait with full terminal hip/knee extension and 4/5 strength >200 ft.  Long Term Goals: To be accomplished in 6 weeks:   1)      The patient will be able to return to all household and full work activities without pain as demonstrated by LEFS>60.  2)      The patient will ascend/descend 1 flight of stairs with reciprocal pattern secondary to full LE ROM and 5/5 strength.  3)      The patient will be able to stand for 60 minutes without pain secondary to strength 5/5 in order to perform woodworking.   []   Patient will continue to benefit from skilled therapy to address remaining goals     PLAN  [x]   Upgrade activities as tolerated      [x]  Frequency/duration: 3x/week for 6 weeks  POC ends (date): 12/01/18  [x]   Continue plan of care    []   Discharge due to:_         Tresa Endo L Raquet  11/05/2018  Lower Extremity Therapy Flow Sheet  Joas Sciullo     Classify Intervention   11 12 13 14 15 16 17 18 19 20    Date:  10/25/2018 10/27/2018 10/29/18 11/01/17 11/03/18 11/05/18       Non timed procedure              Evaluation/re-eval/  D/C      RE        Moist heat/cold pack   MH/CP MH x10' supine x x x  x       Electrical Stimulation   ES             Timed procedure                STM/MFR MT Post knee in straight  incision and quad in flex x x Distal quad/incision in full flex         Joint mobilization MT p patella X inf/sup x          Manual stretching/PassiveROM MT X ext in supine, flex in supine and seated x 10 each x x Ext/flex  Both supine/prone  x       Ultrasound [place]             Exercise/Activity              Quad set TE 20 x 10" x Heel on foam Foam  20/10"  x       Heel slides TE X 20 with strap, foot on ball X no ball 10x10" Ball/strap  X10/10"  x       UBKFO TE Black x 20 x  BLK  tband  x20ea         SLR TE X 10 x  2x10         SAQ/LAQ TE SAQ x 2 p  LAQ x 20 X 20 LAQ  1.5# both x10ea  Focus quad set  2# ea        Prone knee hang TE 2' x  x2'         Sidelying hip abd/clams TE p            Prone ham curls TE X 20 towel under thigh x  x20         Strap Hamstring/Quad stretch TE 3 x 30" each x  x         Stair Stretches TE  flex, ext 10 x 10" each x xx xx xx xx       Standing alternating-hip abd, hip ext TA Red x 20 each x           Minisquats/HR TA X 20 each x  x20ea         TKE TA   Green 10x10" BTB  20/10" x x       Sidestepping TA p            Ascending/descending stairs reciprocally TA             Forward step ups/Lateral Step ups TA 8" x 20 each x  Both  x20 ea         Forward step downs>dips TA 4" x 20 x  x         Cable column/Totalgym TE             LegPress TE             Recumbent bike/elliptical TE Seat 11 x 10 x Seat 9 for flex  Seat 10  x10' Seat 9 for flex X EOS       Gait training GT             HEP updated              Gait training 97116 GT              Moist Heat/Cold Pack 97010   EStim 97014     MH/CP    ES 10 10 10 10  10        Manual Therapy 97140 MT 15 15 15 15  15        Ultrasound 97035  [place]             Therapeutic exercise 97110 TE 35 35 25 30 20 30        Therapeutic Activity 97530 TA 15 15 5 10          Neuromuscular Reeducation 97112  NMR             TOTAL TREATMENT TIME  (untimed+timed minutes)  95 85 55 65 40 55       TIMED CODE TREATMENT  (timed code minutes-  shared minutes)  65 65 45 55 20 45       ** Medicare-add KX at 5th visit if PT&ST/10h visit if PT only**           Add KX    Therapist's Initials  AW AW KR HDB KR KR                 Lower Extremity Therapy Flow Sheet  Griselda Nova     Classify Intervention   1 2 3 4 5 6 7 8 9 10    Date:  09/28/18 09/30/18 10/02/2018 10/04/2018 10/11/18 10/13/2018 10/15/18 10/18/18 10/20/2018 10/22/18   Non timed procedure              Evaluation/re-eval/  D/C  IE            Moist heat/cold pack   MH/CP MH x10' supine BOS and EOS  bolster under knee BOS xx xx X    Declined EOS x x x x declined   Electrical Stimulation   ES             Timed procedure                 STM/MFR MT p scar (once steri strips off), retro for swelling Retro knee for swelling X plus quad in sitting x x X plus GT3 x Post knee in straight  incision and quad in flex x x   Joint mobilization MT p patella x x x x x x x     Manual stretching/PassiveROM MT p flex, ext x X flex in sitting with tibial IR and distraction x x X plus ext in supine x 10 each x Ext/flex in supine and prone X ext in supine, flex in supine and seated x 10 each xx   Ultrasound [place]             Exercise/Activity              Quad set TE 20x5" x x x x x x x  20x10" on 1/2 foam   Heel slides TE x20 with SOS x10 x X 20  x x x Blue pball under heel  With strap  20/5" x With SOS 15x10"   Hip add ball/ Hip abd Tband TE p Ball squeeze 20x5"  UBKF green x20 xx DC ball sq        x UBKF green x20 x x UBKF  BTB  20/5"   X black x   Bridges with ball squeeze TE p   X 20 x x x W/ ball  x20 DC    March>SLR TE p       SLR  10/3" x    SAQ/LAQ TE x20 ea xx xx X LAQ xx xx xx Both  x20ea xx 1# ea   Prone knee hang TE         2'    Sidelying hip abd/clams TE p            Prone ham curls TE p       x20  2 Towel folded thigh x    Strap Hamstring/Quad stretch TE p   2 x 30" HS X   X  Plus prone quad stretch 2 x 30" xx Calf/ham  2/30sec X HS and prone quad 3 x 30" each x   Stair Stretches TE p stair stretches flex, ext  10 x 5" each x 3x20" ea 10 x 5" 3x20" ea x xx 10x10" ea   Standing alternating-  marches, ham curls, hip abd, hip ext TA p        X 20 ext, abd with red band    Minisquats/HR TA p   X 20  HR x20 x  x Xx plus mini squats    Tandem>SLS NMR p            Wobbleboard/BAPS NMR             TKE TA p       GTB  10/10"  p   Sidestepping TA p            Ascending/descending stairs reciprocally TA   4" x 5          Forward step ups/Lateral Step ups TA p  FSU 6" x 10   FSU 6" x 10  LSU 6" x 10 X 20 x20 ea 8"  xx Both 8"  x20ea xx    Forward step downs>dips TA p     4" x 20 x FSD 4" x20 x    Cable column/Totalgym TE             LegPress TE              Recumbent bike/elliptical TE p RB seat 12 rocking x8' X seat 11 x Full rev x10' x x M2 seat 11  x10 x BOS   Treadmill/Elliptical TE                           Gait training GT             HEP updated  As above; printout provided    Educated on swelling and elevation            Gait training 60454 GT              Moist Heat/Cold Pack 97010   EStim 97014     MH/CP    ES  20 20 20 10 10 10 10 10     Manual Therapy 97140 MT  20 15 15 15 15 15 10 15 15    Ultrasound 97035  [place]             Therapeutic exercise 97110 TE 25 40  38 30 25 30 30  35 35 40   Therapeutic Activity 97530 TA   15 15 15 15 10 10 15     Neuromuscular Reeducation 97112  NMR             TOTAL TREATMENT TIME  (untimed+timed minutes)  55 80 88 90 65 70 65 65 85 55    TIMED CODE TREATMENT  (timed code minutes-  shared minutes)  25 60 68 60 55 60 55 55 65 55    ** Medicare-add KX at 5th visit if PT&ST/10h visit if PT only**           Add KX    Therapist's Initials  KR KR AW AW KR AW KR HDB AW KR

## 2018-11-08 ENCOUNTER — Inpatient Hospital Stay: Admit: 2018-11-08 | Payer: PRIVATE HEALTH INSURANCE | Primary: Family Medicine

## 2018-11-08 NOTE — Progress Notes (Signed)
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]   Referral Source: Joel Graham, Joel S., MD     Date:11/08/2018  Visit #: 16     Treatment Area: L TKR  Date of Surgery: 09/08/18  Protocol  []  Yes  Precautions/Restrictions:  Patients prefers to be treated behind curtain when appropriate:  []  Yes   Patient has therapist preference:   []  Male         []  Male         SUBJECTIVE  Pain Level (0-10 scale):               Pain Level (0-10 scale) post treatment:   Medication Changes: []  No    []  Yes (see paper medication log)  Subjective functional status/changes:   []  No changes reported     Bill increased his CBD dose to see if that will help. Still waking up at night and has to walk around.     OBJECTIVE    []  Patient is post-surgical and on a strict surgical protocol that must be followed per MD instructions and to benefit the patient's successful recovery, so treatment started today    Modality: [x]  see flow sheet      Therapeutic Activities: [x]  see flow sheet     []  Other:_  Manual Therapy:  []  see flow sheet     []  Other:_    Patient Education: [x]  Review HEP    []  Progressed/Changed HEP  []  positioning   []  posture/body mechanics   []  transfers   []  heat/ice application    Other Objective/Functional Measures:  []  see evaluation/re-evaluation/progress         ASSESSMENT  []   See evaluation/progress note/reevaluation  Prone ext PROM -3/prone flexion 100 A/108 P. Supine flex 114/120. Leaving strength to minimal and added seated knee distraction with belt followed by flex along with quadriped knee flexion stretch to focus on end range motions.    Short Term Goals: To be accomplished in 3 weeks:  1)      The patient will demonstrate independence with HEP.  2)      The patient will ambulate with proper mechanics during stance  phase of gait with full terminal hip/knee extension and 4/5 strength >200 ft.  Long Term Goals: To be accomplished in 6 weeks:   1)      The patient will be able to return to all household and full work activities without pain as demonstrated by LEFS>60.  2)      The patient will ascend/descend 1 flight of stairs with reciprocal pattern secondary to full LE ROM and 5/5 strength.  3)      The patient will be able to stand for 60 minutes without pain secondary to strength 5/5 in order to perform woodworking.   []   Patient will continue to benefit from skilled therapy to address remaining goals     PLAN  [x]   Upgrade activities as tolerated      [x]  Frequency/duration: 3x/week for 6 weeks  POC ends (date): 12/01/18  [x]   Continue plan of care    []   Discharge due to:_         Lanetta InchHillary D Melinda Pottinger, PT  11/08/2018  Lower Extremity Therapy Flow Sheet  Joel Graham     Classify Intervention   11 12 13 14 15 16 17 18 19 20    Date:  10/25/2018 10/27/2018 10/29/18 11/01/17 11/03/18 11/05/18 11/08/18      Non timed procedure              Evaluation/re-eval/  D/C      RE        Moist heat/cold pack   MH/CP MH x10' supine x x x  x MH x10      Electrical Stimulation   ES             Timed procedure                STM/MFR MT Post knee in straight  incision and quad in flex x x Distal quad/incision in full flex   Distal quad  In full flex supine      Joint mobilization MT p patella X inf/sup x    Mulligan belt seated distraction with flex       Manual stretching/PassiveROM MT X ext in supine, flex in supine and seated x 10 each x x Ext/flex  Both supine/prone  x x      Ultrasound [place]             Exercise/Activity              Quad set TE 20 x 10" x Heel on foam Foam  20/10"  x x      Heel slides TE X 20 with strap, foot on ball X no ball 10x10" Ball/strap  X10/10"  x x      UBKFO TE Black x 20 x  BLK  tband  x20ea   BLK x20ea      SLR TE X 10 x  2x10   1.5# x10       SAQ/LAQ TE SAQ x 2 p  LAQ x 20 X 20 LAQ  1.5# both x10ea  Focus quad set  2# ea 2.5# LAQ   x10      Prone knee hang TE 2' x  x2'   x2'      Quadriped knee flex stretch TE p      10/5"      Prone ham curls TE X 20 towel under thigh x  x20   x10A  x10AA      Strap Hamstring/Quad stretch TE 3 x 30" each x  x   2/30sec      Stair Stretches TE  flex, ext 10 x 10" each x xx xx xx xx xx      Standing alternating-hip abd, hip ext TA Red x 20 each x           Minisquats/HR TA X 20 each x  x20ea         TKE TA   Green 10x10" BTB  20/10" x x BTB  20/10"      Sidestepping TA p            Ascending/descending stairs reciprocally TA             Forward step ups/Lateral Step ups TA 8" x 20 each x  Both  x20 ea         Forward step downs>dips TA 4" x 20 x  x   6" FSD  x10      Cable column/Totalgym TE  LegPress TE             Recumbent bike/elliptical TE Seat 11 x 10 x Seat 9 for flex Seat 10  x10' Seat 9 for flex X EOS x10 EOS  Seat 9  M5      Gait training GT             HEP updated              Gait training 16109 GT              Moist Heat/Cold Pack 97010   EStim 97014     MH/CP    ES 10 10 10 10  10 10       Manual Therapy 97140 MT 15 15 15 15  15 15       Ultrasound 97035  [place]             Therapeutic exercise 97110 TE 35 35 25 30 20 30 30       Therapeutic Activity 97530 TA 15 15 5 10   10       Neuromuscular Reeducation 97112  NMR             TOTAL TREATMENT TIME  (untimed+timed minutes)  95 85 55 65 40 55 65      TIMED CODE TREATMENT  (timed code minutes-  shared minutes)  65 65 45 55 20 45 55      ** Medicare-add KX at 5th visit if PT&ST/10h visit if PT only**           Add KX    Therapist's Initials  AW AW KR HDB KR KR HDB                Lower Extremity Therapy Flow Sheet  Joel Graham     Classify Intervention   1 2 3 4 5 6 7 8 9 10    Date:  09/28/18 09/30/18 10/02/2018 10/04/2018 10/11/18 10/13/2018 10/15/18 10/18/18 10/20/2018 10/22/18   Non timed procedure              Evaluation/re-eval/  D/C  IE             Moist heat/cold pack   MH/CP MH x10' supine BOS and EOS  bolster under knee BOS xx xx X    Declined EOS x x x x declined   Electrical Stimulation   ES             Timed procedure                STM/MFR MT p scar (once steri strips off), retro for swelling Retro knee for swelling X plus quad in sitting x x X plus GT3 x Post knee in straight  incision and quad in flex x x   Joint mobilization MT p patella x x x x x x x     Manual stretching/PassiveROM MT p flex, ext x X flex in sitting with tibial IR and distraction x x X plus ext in supine x 10 each x Ext/flex in supine and prone X ext in supine, flex in supine and seated x 10 each xx   Ultrasound [place]             Exercise/Activity              Quad set TE 20x5" x x x x x x x  20x10" on 1/2 foam   Heel slides TE x20 with SOS x10 x X 20  x x x Blue pball under heel  With strap  20/5" x With SOS 15x10"   Hip add ball/ Hip abd Tband TE p Ball squeeze 20x5"    UBKF green x20 xx DC ball sq        x UBKF green x20 x x UBKF  BTB  20/5"   X black x   Bridges with ball squeeze TE p   X 20 x x x W/ ball  x20 DC    March>SLR TE p       SLR  10/3" x    SAQ/LAQ TE x20 ea xx xx X LAQ xx xx xx Both  x20ea xx 1# ea   Prone knee hang TE         2'    Sidelying hip abd/clams TE p            Prone ham curls TE p       x20  2 Towel folded thigh x    Strap Hamstring/Quad stretch TE p   2 x 30" HS X   X  Plus prone quad stretch 2 x 30" xx Calf/ham  2/30sec X HS and prone quad 3 x 30" each x   Stair Stretches TE p stair stretches flex, ext  10 x 5" each x 3x20" ea 10 x 5" 3x20" ea x xx 10x10" ea   Standing alternating-  marches, ham curls, hip abd, hip ext TA p        X 20 ext, abd with red band    Minisquats/HR TA p   X 20  HR x20 x  x Xx plus mini squats    Tandem>SLS NMR p            Wobbleboard/BAPS NMR             TKE TA p       GTB  10/10"  p   Sidestepping TA p            Ascending/descending stairs reciprocally TA   4" x 5          Forward step ups/Lateral Step ups TA p  FSU 6" x 10    FSU 6" x 10  LSU 6" x 10 X 20 x20 ea 8"  xx Both 8"  x20ea xx    Forward step downs>dips TA p     4" x 20 x FSD 4" x20 x    Cable column/Totalgym TE             LegPress TE             Recumbent bike/elliptical TE p RB seat 12 rocking x8' X seat 11 x Full rev x10' x x M2 seat 11  x10 x BOS   Treadmill/Elliptical TE                           Gait training GT             HEP updated  As above; printout provided    Educated on swelling and elevation            Gait training 25638 GT              Moist Heat/Cold Pack 97010   EStim 97014     MH/CP    ES  20 20 20 10 10 10 10 10     Manual Therapy 97140 MT  20  15 15 15 15 15 10 15 15    Ultrasound 12197  [place]             Therapeutic exercise 97110 TE 25 40 38 30 25 30 30 35 35 40    Therapeutic Activity 97530 TA   15 15 15 15 10 10 15     Neuromuscular Reeducation 97112  NMR             TOTAL TREATMENT TIME  (untimed+timed minutes)  55 80 88 90 65 70 65 65 85 55    TIMED CODE TREATMENT  (timed code minutes-  shared minutes)  25 60 68 60 55 60 55 55 65 55    ** Medicare-add KX at 5th visit if PT&ST/10h visit if PT only**           Add KX    Therapist's Initials  KR KR AW AW KR AW KR HDB AW KR

## 2018-11-08 NOTE — Progress Notes (Signed)
 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]   Referral Source: [name], MD     Date:11/08/2018  Visit #: 16     Treatment Area: L TKR  Date of Surgery: 09/08/18  Protocol  []  Yes  Precautions/Restrictions:  Patients prefers to be treated behind curtain when appropriate:  []  Yes   Patient has therapist preference:   []  Male         []  Male         SUBJECTIVE  Pain Level (0-10 scale):               Pain Level (0-10 scale) post treatment:   Medication Changes: []  No    []  Yes (see paper medication log)  Subjective functional status/changes:   []  No changes reported     Bill increased his CBD dose to see if that will help. Still waking up at night and has to walk around.     OBJECTIVE    []  Patient is post-surgical and on a strict surgical protocol that must be followed per MD instructions and to benefit the patient's successful recovery, so treatment started today    Modality: [x]  see flow sheet      Therapeutic Activities: [x]  see flow sheet     []  Other:_  Manual Therapy:  []  see flow sheet     []  Other:_    Patient Education: [x]  Review HEP    []  Progressed/Changed HEP  []  positioning   []  posture/body mechanics   []  transfers   []  heat/ice application    Other Objective/Functional Measures:  []  see evaluation/re-evaluation/progress         ASSESSMENT  []   See evaluation/progress note/reevaluation  Prone ext PROM -3/prone flexion 100 A/108 P. Supine flex 114/120. Leaving strength to minimal and added seated knee distraction with belt followed by flex along with quadriped knee flexion stretch to focus on end range motions.    Short Term Goals: To be accomplished in 3 weeks:  1)      The patient will demonstrate independence with HEP.  2)      The patient will ambulate with proper mechanics during stance phase of gait with full terminal  hip/knee extension and 4/5 strength >200 ft.  Long Term Goals: To be accomplished in 6 weeks:   1)      The patient will be able to return to all household and full work activities without pain as demonstrated by LEFS>60.  2)      The patient will ascend/descend 1 flight of stairs with reciprocal pattern secondary to full LE ROM and 5/5 strength.  3)      The patient will be able to stand for 60 minutes without pain secondary to strength 5/5 in order to perform woodworking.   []   Patient will continue to benefit from skilled therapy to address remaining goals     PLAN  [x]   Upgrade activities as tolerated      [x]  Frequency/duration: 3x/week for 6 weeks  POC ends (date): 12/01/18  [x]   Continue plan of care    []   Discharge due to:_         Rolland JONETTA Fam, PT  11/08/2018  Lower Extremity Therapy Flow Sheet  Joel Graham     Classify Intervention   11 12 13 14 15 16 17 18 19 20    Date:  10/25/2018 10/27/2018 10/29/18 11/01/17 11/03/18 11/05/18 11/08/18      Non timed procedure              Evaluation/re-eval/  D/C      RE        Moist heat/cold pack   MH/CP MH x10' supine x x x  x MH x10      Electrical Stimulation   ES             Timed procedure                STM/MFR MT Post knee in straight  incision and quad in flex x x Distal quad/incision in full flex   Distal quad  In full flex supine      Joint mobilization MT p patella X inf/sup x    Mulligan belt seated distraction with flex       Manual stretching/PassiveROM MT X ext in supine, flex in supine and seated x 10 each x x Ext/flex  Both supine/prone  x x      Ultrasound US              Exercise/Activity              Quad set TE 20 x 10 x Heel on foam Foam  20/10  x x      Heel slides TE X 20 with strap, foot on ball X no ball 10x10 Ball/strap  X10/10  x x      UBKFO TE Black x 20 x  BLK  tband  x20ea   BLK x20ea      SLR TE X 10 x  2x10   1.5# x10      SAQ/LAQ TE SAQ x 2 p  LAQ x 20 X 20  LAQ  1.5# both x10ea  Focus quad set  2# ea 2.5# LAQ   x10      Prone knee hang TE 2' x  x2'   x2'      Quadriped knee flex stretch TE p      10/5      Prone ham curls TE X 20 towel under thigh x  x20   x10A  x10AA      Strap Hamstring/Quad stretch TE 3 x 30 each x  x   2/30sec      Stair Stretches TE  flex, ext 10 x 10 each x xx xx xx xx xx      Standing alternating-hip abd, hip ext TA Red x 20 each x           Minisquats/HR TA X 20 each x  x20ea         TKE TA   Green 10x10 BTB  20/10 x x BTB  20/10      Sidestepping TA p            Ascending/descending stairs reciprocally TA             Forward step ups/Lateral Step ups TA 8 x 20 each x  Both  x20 ea         Forward step downs>dips TA 4 x 20 x  x   6 FSD  x10      Cable column/Totalgym TE  LegPress TE             Recumbent bike/elliptical TE Seat 11 x 10 x Seat 9 for flex Seat 10  x10' Seat 9 for flex X EOS x10 EOS  Seat 9  M5      Gait training GT             HEP updated              Gait training 02883 GT              Moist Heat/Cold Pack 97010   EStim 97014     MH/CP    ES 10 10 10 10  10 10       Manual Therapy 97140 MT 15 15 15 15  15 15       Ultrasound 97035  [place]              Therapeutic exercise 97110 TE 35 35 25 30 20 30 30       Therapeutic Activity 97530 TA 15 15 5 10   10       Neuromuscular Reeducation 97112  NMR             TOTAL TREATMENT TIME  (untimed+timed minutes)  95 85 55 65 40 55 65      TIMED CODE TREATMENT  (timed code minutes-  shared minutes)  65 65 45 55 20 45 55      ** Medicare-add KX at 5th visit if PT&ST/10h visit if PT only**           Add KX    Therapist's Initials  AW AW KR HDB KR KR HDB                Lower Extremity Therapy Flow Sheet  Joel Graham     Classify Intervention   1 2 3 4 5 6 7 8 9 10    Date:  09/28/18 09/30/18 10/02/2018 10/04/2018 10/11/18 10/13/2018 10/15/18 10/18/18 10/20/2018 10/22/18   Non timed procedure              Evaluation/re-eval/  D/C  IE            Moist heat/cold pack   MH/CP MH x10' supine BOS  and EOS  bolster under knee BOS xx xx X    Declined EOS x x x x declined   Electrical Stimulation   ES             Timed procedure                STM/MFR MT p scar (once steri strips off), retro for swelling Retro knee for swelling X plus quad in sitting x x X plus GT3 x Post knee in straight  incision and quad in flex x x   Joint mobilization MT p patella x x x x x x x     Manual stretching/PassiveROM MT p flex, ext x X flex in sitting with tibial IR and distraction x x X plus ext in supine x 10 each x Ext/flex in supine and prone X ext in supine, flex in supine and seated x 10 each xx   Ultrasound US              Exercise/Activity              Quad set TE 20x5 x x x x x x x  20x10 on 1/2 foam   Heel slides TE x20 with SOS x10 x X 20  x x x Blue pball under heel  With strap  20/5 x With SOS 15x10   Hip add ball/ Hip abd Tband TE p Ball squeeze 20x5    UBKF green x20 xx DC ball sq        x UBKF green x20 x x UBKF  BTB  20/5   X black x   Bridges with ball squeeze TE p   X 20 x x x W/ ball  x20 DC    March>SLR TE p       SLR  10/3 x    SAQ/LAQ TE x20 ea xx xx X LAQ xx xx xx Both  x20ea xx 1# ea   Prone knee hang TE         2'    Sidelying hip abd/clams TE p            Prone ham curls TE p       x20  2 Towel folded thigh x    Strap Hamstring/Quad stretch TE p   2 x 30 HS X   X  Plus prone quad stretch 2 x 30 xx Calf/ham  2/30sec X HS and prone quad 3 x 30 each x   Stair Stretches TE p stair stretches flex, ext  10 x 5 each x 3x20 ea 10 x 5 3x20 ea x xx 10x10 ea   Standing alternating-  marches, ham curls, hip abd, hip ext TA p        X 20 ext, abd with red band    Minisquats/HR TA p   X 20  HR x20 x  x Xx plus mini squats    Tandem>SLS NMR p            Wobbleboard/BAPS NMR             TKE TA p       GTB  10/10  p   Sidestepping TA p            Ascending/descending stairs reciprocally TA   4 x 5          Forward step ups/Lateral Step ups TA p  FSU 6 x 10   FSU 6 x 10  LSU 6 x 10 X 20 x20 ea 8  xx  Both 8  x20ea xx    Forward step downs>dips TA p     4 x 20 x FSD 4 x20 x    Cable column/Totalgym TE             LegPress TE             Recumbent bike/elliptical TE p RB seat 12 rocking x8' X seat 11 x Full rev x10' x x M2 seat 11  x10 x BOS   Treadmill/Elliptical TE                           Gait training GT             HEP updated  As above; printout provided    Educated on swelling and elevation            Gait training 02883 GT              Moist Heat/Cold Pack 97010   EStim 97014     MH/CP    ES  20 20 20 10 10 10 10 10     Manual Therapy 97140 MT  20  15 15 15 15 15 10 15 15    Ultrasound 02964  US              Therapeutic exercise 97110 TE 25 40 38 30 25 30 30 35 35 40    Therapeutic Activity 97530 TA   15 15 15 15 10 10 15     Neuromuscular Reeducation 97112  NMR             TOTAL TREATMENT TIME  (untimed+timed minutes)  55 80 88 90 65 70 65 65 85 55    TIMED CODE TREATMENT  (timed code minutes-  shared minutes)  25 60 68 60 55 60 55 55 65 55    ** Medicare-add KX at 5th visit if PT&ST/10h visit if PT only**           Add KX    Therapist's Initials  KR KR AW AW KR AW KR HDB AW KR

## 2018-11-10 ENCOUNTER — Inpatient Hospital Stay: Admit: 2018-11-10 | Payer: PRIVATE HEALTH INSURANCE | Primary: Family Medicine

## 2018-11-10 NOTE — Progress Notes (Signed)
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]   Referral Source: Joycie Peek., MD     Date:11/10/2018  Visit #: 18     Treatment Area: L TKR  Date of Surgery: 09/08/18  Protocol  []  Yes  Precautions/Restrictions:  Patients prefers to be treated behind curtain when appropriate:  []  Yes   Patient has therapist preference:   []  Male         []  Male         SUBJECTIVE  Pain Level (0-10 scale):               Pain Level (0-10 scale) post treatment:   Medication Changes: []  No    []  Yes (see paper medication log)  Subjective functional status/changes:   []  No changes reported    Patient states they have upped the CBD dose to the highest which has not changed the spasms.  The doctor said the next step would be to consider inhaling it.  He is going to try accupuncture on Saturday.     OBJECTIVE    []  Patient is post-surgical and on a strict surgical protocol that must be followed per MD instructions and to benefit the patient's successful recovery, so treatment started today    Modality: [x]  see flow sheet      Therapeutic Activities: [x]  see flow sheet     []  Other:_  Manual Therapy:  []  see flow sheet     []  Other:_    Patient Education: [x]  Review HEP    []  Progressed/Changed HEP  []  positioning   []  posture/body mechanics   []  transfers   []  heat/ice application    Other Objective/Functional Measures:  []  see evaluation/re-evaluation/progress         ASSESSMENT  []   See evaluation/progress note/reevaluation    Patient given cues to relax the rest of his body during exercises as he has a tendency to hold tension elsewhere, which may be contributing general heightened state of his neurological system and spasm activity.  Patient had less tightness in quads and hamstrings after STM.  Hamstrings  irritable today and prone to spasming with palpation and stretching.  Patient had improved response to these activities when performed gradually.  Cued patient to change position or decrease hold times to decrease spasming.  Able to approximate plinth with posterior knee during knee extension manual stretching indicating improving extension ROM.  Patient exhibited improved fluidity of movement with recumbent elliptical and with gait at end of session.    Short Term Goals: To be accomplished in 3 weeks:  1)      The patient will demonstrate independence with HEP.  2)      The patient will ambulate with proper mechanics during stance phase of gait with full terminal hip/knee extension and 4/5 strength >200 ft.  Long Term Goals: To be accomplished in 6 weeks:   1)      The patient will be able to return to all household and full work activities without pain as demonstrated by LEFS>60.  2)      The patient will ascend/descend 1 flight of stairs with reciprocal pattern secondary to full LE ROM and 5/5 strength.  3)      The patient will be able to stand for 60 minutes without pain secondary to strength 5/5  in order to perform woodworking.   []   Patient will continue to benefit from skilled therapy to address remaining goals     PLAN  [x]   Upgrade activities as tolerated      [x]  Frequency/duration: 3x/week for 6 weeks  POC ends (date): 12/01/18  [x]   Continue plan of care    []   Discharge due to:_         [name]  11/10/2018                                                                             Lower Extremity Therapy Flow Sheet  Aren Saupe     Classify Intervention   11 12 13 14 15 16 17 18 19 20    Date:  10/25/2018 10/27/2018 10/29/18 11/01/17 11/03/18 11/05/18 11/08/18 11/10/2018     Non timed procedure              Evaluation/re-eval/  D/C      RE        Moist heat/cold pack   MH/CP MH x10' supine x x x  x MH x10 x     Electrical Stimulation   ES             Timed procedure                 STM/MFR MT Post knee in straight  incision and quad in flex x x Distal quad/incision in full flex   Distal quad  In full flex supine Quad and hamstring STM     Joint mobilization MT p patella X inf/sup x    Mulligan belt seated distraction with flex  Manual distraction     Manual stretching/PassiveROM MT X ext in supine, flex in supine and seated x 10 each x x Ext/flex  Both supine/prone  x x Flex in seated ext in supine     Ultrasound [place]             Exercise/Activity              Quad set TE 20 x 10" x Heel on foam Foam  20/10"  x x x     Heel slides TE X 20 with strap, foot on ball X no ball 10x10" Ball/strap  X10/10"  x x x     UBKFO TE Black x 20 x  BLK  tband  x20ea   BLK x20ea x     SLR TE X 10 x  2x10   1.5# x10      SAQ/LAQ TE SAQ x 2 p  LAQ x 20 X 20 LAQ  1.5# both x10ea  Focus quad set  2# ea 2.5# LAQ   x10 2 x 10     Prone knee hang TE 2' x  x2'   x2' x     Quadriped knee flex stretch TE p      10/5" x     Prone ham curls TE X 20 towel under thigh x  x20   x10A  x10AA x     Strap Hamstring/Quad stretch TE 3 x 30" each x  x   2/30sec x     Stair Stretches  TE  flex, ext 10 x 10" each x xx xx xx xx xx      Standing alternating-hip abd, hip ext TA Red x 20 each x           Minisquats/HR TA X 20 each x  x20ea         TKE TA   Green 10x10" BTB  20/10" x x BTB  20/10" x     Sidestepping TA p            Ascending/descending stairs reciprocally TA             Forward step ups/Lateral Step ups TA 8" x 20 each x  Both  x20 ea         Forward step downs>dips TA 4" x 20 x  x   6" FSD  x10 X 20     Cable column/Totalgym TE             LegPress TE             Recumbent bike/elliptical TE Seat 11 x 10 x Seat 9 for flex Seat 10  x10' Seat 9 for flex X EOS x10 EOS  Seat 9  M5 X rec elliptical seat 7     Gait training GT             HEP updated              Gait training 81829 GT              Moist Heat/Cold Pack 97010   EStim 97014     MH/CP    ES 10 10 10 10  10 10 10      Manual Therapy 97140 MT 15 15 15 15  15 15 15       Ultrasound 97035  [place]             Therapeutic exercise 97110 TE 35 35 25 30 20 30 30 30      Therapeutic Activity 97530 TA 15 15 5 10   10 10      Neuromuscular Reeducation 97112  NMR             TOTAL TREATMENT TIME  (untimed+timed minutes)  95 85 55 65 40 55 65 65     TIMED CODE TREATMENT  (timed code minutes-  shared minutes)  65 65 45 55 20 45 55 55     ** Medicare-add KX at 5th visit if PT&ST/10h visit if PT only**           Add KX    Therapist's Initials  AW AW KR HDB KR KR HDB AW               Lower Extremity Therapy Flow Sheet  Luvern Finkbiner     Classify Intervention   1 2 3 4 5 6 7 8 9 10    Date:  09/28/18 09/30/18 10/02/2018 10/04/2018 10/11/18 10/13/2018 10/15/18 10/18/18 10/20/2018 10/22/18   Non timed procedure              Evaluation/re-eval/  D/C  IE            Moist heat/cold pack   MH/CP MH x10' supine BOS and EOS  bolster under knee BOS xx xx X    Declined EOS x x x x declined   Electrical Stimulation   ES             Timed procedure  STM/MFR MT p scar (once steri strips off), retro for swelling Retro knee for swelling X plus quad in sitting x x X plus GT3 x Post knee in straight  incision and quad in flex x x   Joint mobilization MT p patella x x x x x x x     Manual stretching/PassiveROM MT p flex, ext x X flex in sitting with tibial IR and distraction x x X plus ext in supine x 10 each x Ext/flex in supine and prone X ext in supine, flex in supine and seated x 10 each xx   Ultrasound US             Exercise/Activity              Quad set TE 20x5" x x x x x x x  20x10" on 1/2 foam   Heel slides TE x20 with SOS x10 x X 20  x x x Blue pball under heel  With strap  20/5" x With SOS 15x10"   Hip add ball/ Hip abd Tband TE p Ball squeeze 20x5"    UBKF green x20 xx DC ball sq        x UBKF green x20 x x UBKF  BTB  20/5"   X black x   Bridges with ball squeeze TE p   X 20 x x x W/ ball  x20 DC    March>SLR TE p       SLR  10/3" x    SAQ/LAQ TE x20 ea xx xx X LAQ xx xx xx Both  x20ea xx 1# ea    Prone knee hang TE         2'    Sidelying hip abd/clams TE p            Prone ham curls TE p       x20  2 Towel folded thigh x    Strap Hamstring/Quad stretch TE p   2 x 30" HS X   X  Plus prone quad stretch 2 x 30" xx Calf/ham  2/30sec X HS and prone quad 3 x 30" each x   Stair Stretches TE p stair stretches flex, ext  10 x 5" each x 3x20" ea 10 x 5" 3x20" ea x xx 10x10" ea   Standing alternating-  marches, ham curls, hip abd, hip ext TA p        X 20 ext, abd with red band    Minisquats/HR TA p   X 20  HR x20 x  x Xx plus mini squats    Tandem>SLS NMR p            Wobbleboard/BAPS NMR             TKE TA p       GTB  10/10"  p   Sidestepping TA p            Ascending/descending stairs reciprocally TA   4" x 5          Forward step ups/Lateral Step ups TA p  FSU 6" x 10   FSU 6" x 10  LSU 6" x 10 X 20 x20 ea 8"  xx Both 8"  x20ea xx    Forward step downs>dips TA p     4" x 20 x FSD 4" x20 x    Cable column/Totalgym TE             LegPress TE  Recumbent bike/elliptical TE p RB seat 12 rocking x8' X seat 11 x Full rev x10' x x M2 seat 11  x10 x BOS   Treadmill/Elliptical TE                           Gait training GT             HEP updated  As above; printout provided    Educated on swelling and elevation            Gait training 30940 GT              Moist Heat/Cold Pack 97010   EStim 97014     MH/CP    ES  20 20 20 10 10 10 10 10     Manual Therapy 97140 MT  20 15 15 15 15 15 10 15 15    Ultrasound 97035  [place]             Therapeutic exercise 97110 TE 25 40 38 30 25 30 30 35 35 40    Therapeutic Activity 97530 TA   15 15 15 15 10 10 15     Neuromuscular Reeducation 97112  NMR             TOTAL TREATMENT TIME  (untimed+timed minutes)  55 80 88 90 65 70 65 65 85 55    TIMED CODE TREATMENT  (timed code minutes-  shared minutes)  25 60 68 60 55 60 55 55 65 55    ** Medicare-add KX at 5th visit if PT&ST/10h visit if PT only**           Add KX    Therapist's Initials  KR KR AW AW KR AW KR HDB AW KR

## 2018-11-10 NOTE — Progress Notes (Signed)
 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]   Referral Source: [name], MD     Date:11/10/2018  Visit #: 18     Treatment Area: L TKR  Date of Surgery: 09/08/18  Protocol  []  Yes  Precautions/Restrictions:  Patients prefers to be treated behind curtain when appropriate:  []  Yes   Patient has therapist preference:   []  Male         []  Male         SUBJECTIVE  Pain Level (0-10 scale):               Pain Level (0-10 scale) post treatment:   Medication Changes: []  No    []  Yes (see paper medication log)  Subjective functional status/changes:   []  No changes reported    Patient states they have upped the CBD dose to the highest which has not changed the spasms.  The doctor said the next step would be to consider inhaling it.  He is going to try accupuncture on Saturday.     OBJECTIVE    []  Patient is post-surgical and on a strict surgical protocol that must be followed per MD instructions and to benefit the patient's successful recovery, so treatment started today    Modality: [x]  see flow sheet      Therapeutic Activities: [x]  see flow sheet     []  Other:_  Manual Therapy:  []  see flow sheet     []  Other:_    Patient Education: [x]  Review HEP    []  Progressed/Changed HEP  []  positioning   []  posture/body mechanics   []  transfers   []  heat/ice application    Other Objective/Functional Measures:  []  see evaluation/re-evaluation/progress         ASSESSMENT  []   See evaluation/progress note/reevaluation    Patient given cues to relax the rest of his body during exercises as he has a tendency to hold tension elsewhere, which may be contributing general heightened state of his neurological system and spasm activity.  Patient had less tightness in quads and hamstrings after STM.  Hamstrings irritable today and prone to spasming with palpation and  stretching.  Patient had improved response to these activities when performed gradually.  Cued patient to change position or decrease hold times to decrease spasming.  Able to approximate plinth with posterior knee during knee extension manual stretching indicating improving extension ROM.  Patient exhibited improved fluidity of movement with recumbent elliptical and with gait at end of session.    Short Term Goals: To be accomplished in 3 weeks:  1)      The patient will demonstrate independence with HEP.  2)      The patient will ambulate with proper mechanics during stance phase of gait with full terminal hip/knee extension and 4/5 strength >200 ft.  Long Term Goals: To be accomplished in 6 weeks:   1)      The patient will be able to return to all household and full work activities without pain as demonstrated by LEFS>60.  2)      The patient will ascend/descend 1 flight of stairs with reciprocal pattern secondary to full LE ROM and 5/5 strength.  3)      The patient will be able to stand for 60 minutes without pain secondary to strength 5/5  in order to perform woodworking.   []   Patient will continue to benefit from skilled therapy to address remaining goals     PLAN  [x]   Upgrade activities as tolerated      [x]  Frequency/duration: 3x/week for 6 weeks  POC ends (date): 12/01/18  [x]   Continue plan of care    []   Discharge due to:_         [name]  11/10/2018                                                                             Lower Extremity Therapy Flow Sheet  Joel Graham     Classify Intervention   11 12 13 14 15 16 17 18 19 20    Date:  10/25/2018 10/27/2018 10/29/18 11/01/17 11/03/18 11/05/18 11/08/18 11/10/2018     Non timed procedure              Evaluation/re-eval/  D/C      RE        Moist heat/cold pack   MH/CP MH x10' supine x x x  x MH x10 x     Electrical Stimulation   ES             Timed procedure                STM/MFR MT Post knee in straight  incision and quad in flex x x Distal  quad/incision in full flex   Distal quad  In full flex supine Quad and hamstring STM     Joint mobilization MT p patella X inf/sup x    Mulligan belt seated distraction with flex  Manual distraction     Manual stretching/PassiveROM MT X ext in supine, flex in supine and seated x 10 each x x Ext/flex  Both supine/prone  x x Flex in seated ext in supine     Ultrasound US              Exercise/Activity              Quad set TE 20 x 10 x Heel on foam Foam  20/10  x x x     Heel slides TE X 20 with strap, foot on ball X no ball 10x10 Ball/strap  X10/10  x x x     UBKFO TE Black x 20 x  BLK  tband  x20ea   BLK x20ea x     SLR TE X 10 x  2x10   1.5# x10      SAQ/LAQ TE SAQ x 2 p  LAQ x 20 X 20 LAQ  1.5# both x10ea  Focus quad set  2# ea 2.5# LAQ   x10 2 x 10     Prone knee hang TE 2' x  x2'   x2' x     Quadriped knee flex stretch TE p      10/5 x     Prone ham curls TE X 20 towel under thigh x  x20   x10A  x10AA x     Strap Hamstring/Quad stretch TE 3 x 30 each x  x   2/30sec x     Stair Stretches  TE  flex, ext 10 x 10 each x xx xx xx xx xx      Standing alternating-hip abd, hip ext TA Red x 20 each x           Minisquats/HR TA X 20 each x  x20ea         TKE TA   Green 10x10 BTB  20/10 x x BTB  20/10 x     Sidestepping TA p            Ascending/descending stairs reciprocally TA             Forward step ups/Lateral Step ups TA 8 x 20 each x  Both  x20 ea         Forward step downs>dips TA 4 x 20 x  x   6 FSD  x10 X 20     Cable column/Totalgym TE             LegPress TE             Recumbent bike/elliptical TE Seat 11 x 10 x Seat 9 for flex Seat 10  x10' Seat 9 for flex X EOS x10 EOS  Seat 9  M5 X rec elliptical seat 7     Gait training GT             HEP updated              Gait training 02883 GT              Moist Heat/Cold Pack 97010   EStim 97014     MH/CP    ES 10 10 10 10  10 10 10      Manual Therapy 97140 MT 15 15 15 15  15 15 15      Ultrasound 97035  [place]              Therapeutic exercise 97110 TE 35 35 25  30 20 30 30 30      Therapeutic Activity 97530 TA 15 15 5 10   10 10      Neuromuscular Reeducation 97112  NMR             TOTAL TREATMENT TIME  (untimed+timed minutes)  95 85 55 65 40 55 65 65     TIMED CODE TREATMENT  (timed code minutes-  shared minutes)  65 65 45 55 20 45 55 55     ** Medicare-add KX at 5th visit if PT&ST/10h visit if PT only**           Add KX    Therapist's Initials  AW AW KR HDB KR KR HDB AW               Lower Extremity Therapy Flow Sheet  Joel Graham     Classify Intervention   1 2 3 4 5 6 7 8 9 10    Date:  09/28/18 09/30/18 10/02/2018 10/04/2018 10/11/18 10/13/2018 10/15/18 10/18/18 10/20/2018 10/22/18   Non timed procedure              Evaluation/re-eval/  D/C  IE            Moist heat/cold pack   MH/CP MH x10' supine BOS and EOS  bolster under knee BOS xx xx X    Declined EOS x x x x declined   Electrical Stimulation   ES             Timed procedure  STM/MFR MT p scar (once steri strips off), retro for swelling Retro knee for swelling X plus quad in sitting x x X plus GT3 x Post knee in straight  incision and quad in flex x x   Joint mobilization MT p patella x x x x x x x     Manual stretching/PassiveROM MT p flex, ext x X flex in sitting with tibial IR and distraction x x X plus ext in supine x 10 each x Ext/flex in supine and prone X ext in supine, flex in supine and seated x 10 each xx   Ultrasound US              Exercise/Activity              Quad set TE 20x5 x x x x x x x  20x10 on 1/2 foam   Heel slides TE x20 with SOS x10 x X 20  x x x Blue pball under heel  With strap  20/5 x With SOS 15x10   Hip add ball/ Hip abd Tband TE p Ball squeeze 20x5    UBKF green x20 xx DC ball sq        x UBKF green x20 x x UBKF  BTB  20/5   X black x   Bridges with ball squeeze TE p   X 20 x x x W/ ball  x20 DC    March>SLR TE p       SLR  10/3 x    SAQ/LAQ TE x20 ea xx xx X LAQ xx xx xx Both  x20ea xx 1# ea   Prone knee hang TE         2'    Sidelying hip abd/clams TE p            Prone  ham curls TE p       x20  2 Towel folded thigh x    Strap Hamstring/Quad stretch TE p   2 x 30 HS X   X  Plus prone quad stretch 2 x 30 xx Calf/ham  2/30sec X HS and prone quad 3 x 30 each x   Stair Stretches TE p stair stretches flex, ext  10 x 5 each x 3x20 ea 10 x 5 3x20 ea x xx 10x10 ea   Standing alternating-  marches, ham curls, hip abd, hip ext TA p        X 20 ext, abd with red band    Minisquats/HR TA p   X 20  HR x20 x  x Xx plus mini squats    Tandem>SLS NMR p            Wobbleboard/BAPS NMR             TKE TA p       GTB  10/10  p   Sidestepping TA p            Ascending/descending stairs reciprocally TA   4 x 5          Forward step ups/Lateral Step ups TA p  FSU 6 x 10   FSU 6 x 10  LSU 6 x 10 X 20 x20 ea 8  xx Both 8  x20ea xx    Forward step downs>dips TA p     4 x 20 x FSD 4 x20 x    Cable column/Totalgym TE             LegPress TE  Recumbent bike/elliptical TE p RB seat 12 rocking x8' X seat 11 x Full rev x10' x x M2 seat 11  x10 x BOS   Treadmill/Elliptical TE                           Gait training GT             HEP updated  As above; printout provided    Educated on swelling and elevation            Gait training 02883 GT              Moist Heat/Cold Pack 97010   EStim 97014     MH/CP    ES  20 20 20 10 10 10 10 10     Manual Therapy 97140 MT  20 15 15 15 15 15 10 15 15    Ultrasound 97035  US              Therapeutic exercise 97110 TE 25 40 38 30 25 30 30 35 35 40    Therapeutic Activity 97530 TA   15 15 15 15 10 10 15     Neuromuscular Reeducation 97112  NMR             TOTAL TREATMENT TIME  (untimed+timed minutes)  55 80 88 90 65 70 65 65 85 55    TIMED CODE TREATMENT  (timed code minutes-  shared minutes)  25 60 68 60 55 60 55 55 65 55    ** Medicare-add KX at 5th visit if PT&ST/10h visit if PT only**           Add KX    Therapist's Initials  KR KR AW AW KR AW KR HDB AW KR

## 2018-11-12 ENCOUNTER — Inpatient Hospital Stay: Admit: 2018-11-12 | Payer: PRIVATE HEALTH INSURANCE | Primary: Family Medicine

## 2018-11-12 NOTE — Progress Notes (Signed)
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]   Referral Source: Joycie Peek., MD     Date:11/12/2018  Visit #: 19     Treatment Area: L TKR  Date of Surgery: 09/08/18  Protocol  []  Yes  Precautions/Restrictions:  Patients prefers to be treated behind curtain when appropriate:  []  Yes   Patient has therapist preference:   []  Male         []  Male         SUBJECTIVE  Pain Level (0-10 scale):               Pain Level (0-10 scale) post treatment:   Medication Changes: []  No    []  Yes (see paper medication log)  Subjective functional status/changes:   []  No changes reported    Patient states he is feeling well enough to climb on ladders and partially kneel, but he is more sore now.     OBJECTIVE    []  Patient is post-surgical and on a strict surgical protocol that must be followed per MD instructions and to benefit the patient's successful recovery, so treatment started today    Modality: [x]  see flow sheet      Therapeutic Activities: [x]  see flow sheet     []  Other:_  Manual Therapy:  []  see flow sheet     []  Other:_    Patient Education: [x]  Review HEP    []  Progressed/Changed HEP  []  positioning   []  posture/body mechanics   []  transfers   []  heat/ice application    Other Objective/Functional Measures:  []  see evaluation/re-evaluation/progress         ASSESSMENT  []   See evaluation/progress note/reevaluation    Patient given cued to stretch slowly and only to pull not pain. End ranges of extension and flexion very springy/elastically. Much improved flexion with swing phase.  Short Term Goals: To be accomplished in 3 weeks:  1)      The patient will demonstrate independence with HEP.  2)      The patient will ambulate with proper mechanics during stance phase of gait with full terminal hip/knee extension and 4/5 strength >200 ft.   Long Term Goals: To be accomplished in 6 weeks:   1)      The patient will be able to return to all household and full work activities without pain as demonstrated by LEFS>60.  2)      The patient will ascend/descend 1 flight of stairs with reciprocal pattern secondary to full LE ROM and 5/5 strength.  3)      The patient will be able to stand for 60 minutes without pain secondary to strength 5/5 in order to perform woodworking.   []   Patient will continue to benefit from skilled therapy to address remaining goals     PLAN  [x]   Upgrade activities as tolerated      [x]  Frequency/duration: 3x/week for 6 weeks  POC ends (date): 12/01/18  [x]   Continue plan of care    []   Discharge due to:_         Lanetta Inch, PT  11/12/2018  Lower Extremity Therapy Flow Sheet  Park BreedWilliam Persichetti     Classify Intervention   11 12 13 14 15 16 17 18 19 20    Date:  10/25/2018 10/27/2018 10/29/18 11/01/17 11/03/18 11/05/18 11/08/18 11/10/2018 11/12/18    Non timed procedure              Evaluation/re-eval/  D/C      RE        Moist heat/cold pack   MH/CP MH x10' supine x x x  x MH x10 x x    Electrical Stimulation   ES             Timed procedure                STM/MFR MT Post knee in straight  incision and quad in flex x x Distal quad/incision in full flex   Distal quad  In full flex supine Quad and hamstring STM Ham/quad in flex    Joint mobilization MT p patella X inf/sup x    Mulligan belt seated distraction with flex  Manual distraction Mulligan  seated flex/distract    Manual stretching/PassiveROM MT X ext in supine, flex in supine and seated x 10 each x x Ext/flex  Both supine/prone  x x Flex in seated ext in supine All 3 positions    Ultrasound US             Exercise/Activity              Quad set TE 20 x 10" x Heel on foam Foam  20/10"  x x x x    Heel slides TE X 20 with strap, foot on ball X no ball 10x10" Ball/strap  X10/10"  x x x 20/5"     UBKFO TE Black x 20 x  BLK  tband  x20ea   BLK x20ea x BLK  x20ea    SLR TE X 10 x  2x10   1.5# x10  Bridges  x10    SAQ/LAQ TE SAQ x 2 p  LAQ x 20 X 20 LAQ  1.5# both x10ea  Focus quad set  2# ea 2.5# LAQ   x10 2 x 10 2.5# LAQ  x20    Prone knee hang TE 2' x  x2'   x2' x x    Quadriped knee flex stretch TE p      10/5" x x    Prone ham curls TE X 20 towel under thigh x  x20   x10A  x10AA x x10 A  x10 AA    Strap Hamstring/Quad stretch TE 3 x 30" each x  x   2/30sec x Supine ham/  Prone quad 2/30sec    Stair Stretches TE  flex, ext 10 x 10" each x xx xx xx xx xx  xx    Standing alternating-hip abd, hip ext TA Red x 20 each x           Minisquats/HR TA X 20 each x  x20ea     Calf raises on 1/2 roll  x20    TKE TA   Green 10x10" BTB  20/10" x x BTB  20/10" x BLK  20/10"    Sidestepping TA p            Ascending/descending stairs reciprocally TA             Forward step ups/Lateral Step ups TA 8" x 20 each x  Both  x20 ea  Forward step downs>dips TA 4" x 20 x  x   6" FSD  x10  6" FSD  X 20    Cable column/Totalgym TE             LegPress TE             Recumbent bike/elliptical TE Seat 11 x 10 x Seat 9 for flex Seat 10  x10' Seat 9 for flex X EOS x10 EOS  Seat 9  M5 X rec elliptical seat 7 RB seat 9  x10    Gait training GT             HEP updated              Gait training 99833 GT              Moist Heat/Cold Pack 97010   EStim 97014     MH/CP    ES 10 10 10 10  10 10 10 10     Manual Therapy 97140 MT 15 15 15 15  15 15 15 10     Ultrasound 97035  [place]             Therapeutic exercise 97110 TE 35 35 25 30 20 30 30 30  35    Therapeutic Activity 97530 TA 15 15 5 10   10 10 10     Neuromuscular Reeducation 97112  NMR             TOTAL TREATMENT TIME  (untimed+timed minutes)  95 85 55 65 40 55 65 65 65    TIMED CODE TREATMENT  (timed code minutes-  shared minutes)  65 65 45 55 20 45 55 55 55    ** Medicare-add KX at 5th visit if PT&ST/10h visit if PT only**           Add KX     Therapist's Initials  AW AW KR HDB KR KR HDB AW HDB              Lower Extremity Therapy Flow Sheet  Liron Heineman     Classify Intervention   1 2 3 4 5 6 7 8 9 10    Date:  09/28/18 09/30/18 10/02/2018 10/04/2018 10/11/18 10/13/2018 10/15/18 10/18/18 10/20/2018 10/22/18   Non timed procedure              Evaluation/re-eval/  D/C  IE            Moist heat/cold pack   MH/CP MH x10' supine BOS and EOS  bolster under knee BOS xx xx X    Declined EOS x x x x declined   Electrical Stimulation   ES             Timed procedure                STM/MFR MT p scar (once steri strips off), retro for swelling Retro knee for swelling X plus quad in sitting x x X plus GT3 x Post knee in straight  incision and quad in flex x x   Joint mobilization MT p patella x x x x x x x     Manual stretching/PassiveROM MT p flex, ext x X flex in sitting with tibial IR and distraction x x X plus ext in supine x 10 each x Ext/flex in supine and prone X ext in supine, flex in supine and seated x 10 each xx   Ultrasound [place]  Exercise/Activity              Quad set TE 20x5" x x x x x x x  20x10" on 1/2 foam   Heel slides TE x20 with SOS x10 x X 20  x x x Blue pball under heel  With strap  20/5" x With SOS 15x10"   Hip add ball/ Hip abd Tband TE p Ball squeeze 20x5"    UBKF green x20 xx DC ball sq        x UBKF green x20 x x UBKF  BTB  20/5"   X black x   Bridges with ball squeeze TE p   X 20 x x x W/ ball  x20 DC    March>SLR TE p       SLR  10/3" x    SAQ/LAQ TE x20 ea xx xx X LAQ xx xx xx Both  x20ea xx 1# ea   Prone knee hang TE         2'    Sidelying hip abd/clams TE p            Prone ham curls TE p       x20  2 Towel folded thigh x    Strap Hamstring/Quad stretch TE p   2 x 30" HS X   X  Plus prone quad stretch 2 x 30" xx Calf/ham  2/30sec X HS and prone quad 3 x 30" each x   Stair Stretches TE p stair stretches flex, ext  10 x 5" each x 3x20" ea 10 x 5" 3x20" ea x xx 10x10" ea   Standing alternating-   marches, ham curls, hip abd, hip ext TA p        X 20 ext, abd with red band    Minisquats/HR TA p   X 20  HR x20 x  x Xx plus mini squats    Tandem>SLS NMR p            Wobbleboard/BAPS NMR             TKE TA p       GTB  10/10"  p   Sidestepping TA p            Ascending/descending stairs reciprocally TA   4" x 5          Forward step ups/Lateral Step ups TA p  FSU 6" x 10   FSU 6" x 10  LSU 6" x 10 X 20 x20 ea 8"  xx Both 8"  x20ea xx    Forward step downs>dips TA p     4" x 20 x FSD 4" x20 x    Cable column/Totalgym TE             LegPress TE             Recumbent bike/elliptical TE p RB seat 12 rocking x8' X seat 11 x Full rev x10' x x M2 seat 11  x10 x BOS   Treadmill/Elliptical TE                           Gait training GT             HEP updated  As above; printout provided    Educated on swelling and elevation            Gait training [id number] GT  Moist Heat/Cold Pack 33435   EStim 97014     MH/CP    ES  20 20 20 10 10 10 10 10     Manual Therapy 97140 MT  20 15 15 15 15 15 10 15 15    Ultrasound [id number]  [place]             Therapeutic exercise 97110 TE 25 40 38 30 25 30 30 35 35 40    Therapeutic Activity 97530 TA   15 15 15 15 10 10 15     Neuromuscular Reeducation 97112  NMR             TOTAL TREATMENT TIME  (untimed+timed minutes)  55 80 88 90 65 70 65 65 85 55    TIMED CODE TREATMENT  (timed code minutes-  shared minutes)  25 60 68 60 55 60 55 55 65 55    ** Medicare-add KX at 5th visit if PT&ST/10h visit if PT only**           Add KX    Therapist's Initials  KR KR AW AW KR AW KR HDB AW KR

## 2018-11-12 NOTE — Progress Notes (Signed)
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]   Referral Source: Joycie Peek., MD     Date:11/12/2018  Visit #: 19     Treatment Area: L TKR  Date of Surgery: 09/08/18  Protocol  []  Yes  Precautions/Restrictions:  Patients prefers to be treated behind curtain when appropriate:  []  Yes   Patient has therapist preference:   []  Male         []  Male         SUBJECTIVE  Pain Level (0-10 scale):               Pain Level (0-10 scale) post treatment:   Medication Changes: []  No    []  Yes (see paper medication log)  Subjective functional status/changes:   []  No changes reported    Patient states he is feeling well enough to climb on ladders and partially kneel, but he is more sore now.     OBJECTIVE    []  Patient is post-surgical and on a strict surgical protocol that must be followed per MD instructions and to benefit the patient's successful recovery, so treatment started today    Modality: [x]  see flow sheet      Therapeutic Activities: [x]  see flow sheet     []  Other:_  Manual Therapy:  []  see flow sheet     []  Other:_    Patient Education: [x]  Review HEP    []  Progressed/Changed HEP  []  positioning   []  posture/body mechanics   []  transfers   []  heat/ice application    Other Objective/Functional Measures:  []  see evaluation/re-evaluation/progress         ASSESSMENT  []   See evaluation/progress note/reevaluation    Patient given cued to stretch slowly and only to pull not pain. End ranges of extension and flexion very springy/elastically. Much improved flexion with swing phase.  Short Term Goals: To be accomplished in 3 weeks:  1)      The patient will demonstrate independence with HEP.  2)      The patient will ambulate with proper mechanics during stance phase of gait with full terminal hip/knee extension and 4/5 strength >200 ft.  Long Term  Goals: To be accomplished in 6 weeks:   1)      The patient will be able to return to all household and full work activities without pain as demonstrated by LEFS>60.  2)      The patient will ascend/descend 1 flight of stairs with reciprocal pattern secondary to full LE ROM and 5/5 strength.  3)      The patient will be able to stand for 60 minutes without pain secondary to strength 5/5 in order to perform woodworking.   []   Patient will continue to benefit from skilled therapy to address remaining goals     PLAN  [x]   Upgrade activities as tolerated      [x]  Frequency/duration: 3x/week for 6 weeks  POC ends (date): 12/01/18  [x]   Continue plan of care    []   Discharge due to:_         Lanetta Inch, PT  11/12/2018  Lower Extremity Therapy Flow Sheet  Joel Graham     Classify Intervention   11 12 13 14 15 16 17 18 19 20    Date:  10/25/2018 10/27/2018 10/29/18 11/01/17 11/03/18 11/05/18 11/08/18 11/10/2018 11/12/18    Non timed procedure              Evaluation/re-eval/  D/C      RE        Moist heat/cold pack   MH/CP MH x10' supine x x x  x MH x10 x x    Electrical Stimulation   ES             Timed procedure                STM/MFR MT Post knee in straight  incision and quad in flex x x Distal quad/incision in full flex   Distal quad  In full flex supine Quad and hamstring STM Ham/quad in flex    Joint mobilization MT p patella X inf/sup x    Mulligan belt seated distraction with flex  Manual distraction Mulligan  seated flex/distract    Manual stretching/PassiveROM MT X ext in supine, flex in supine and seated x 10 each x x Ext/flex  Both supine/prone  x x Flex in seated ext in supine All 3 positions    Ultrasound [place]             Exercise/Activity              Quad set TE 20 x 10" x Heel on foam Foam  20/10"  x x x x    Heel slides TE X 20 with strap, foot on ball X no ball 10x10" Ball/strap  X10/10"  x x x 20/5"    UBKFO TE Black x 20 x   BLK  tband  x20ea   BLK x20ea x BLK  x20ea    SLR TE X 10 x  2x10   1.5# x10  Bridges  x10    SAQ/LAQ TE SAQ x 2 p  LAQ x 20 X 20 LAQ  1.5# both x10ea  Focus quad set  2# ea 2.5# LAQ   x10 2 x 10 2.5# LAQ  x20    Prone knee hang TE 2' x  x2'   x2' x x    Quadriped knee flex stretch TE p      10/5" x x    Prone ham curls TE X 20 towel under thigh x  x20   x10A  x10AA x x10 A  x10 AA    Strap Hamstring/Quad stretch TE 3 x 30" each x  x   2/30sec x Supine ham/  Prone quad 2/30sec    Stair Stretches TE  flex, ext 10 x 10" each x xx xx xx xx xx  xx    Standing alternating-hip abd, hip ext TA Red x 20 each x           Minisquats/HR TA X 20 each x  x20ea     Calf raises on 1/2 roll  x20    TKE TA   Green 10x10" BTB  20/10" x x BTB  20/10" x BLK  20/10"    Sidestepping TA p            Ascending/descending stairs reciprocally TA             Forward step ups/Lateral Step ups TA 8" x 20 each x  Both  x20 ea  Forward step downs>dips TA 4" x 20 x  x   6" FSD  x10  6" FSD  X 20    Cable column/Totalgym TE             LegPress TE             Recumbent bike/elliptical TE Seat 11 x 10 x Seat 9 for flex Seat 10  x10' Seat 9 for flex X EOS x10 EOS  Seat 9  M5 X rec elliptical seat 7 RB seat 9  x10    Gait training GT             HEP updated              Gait training 59563 GT              Moist Heat/Cold Pack 97010   EStim 97014     MH/CP    ES 10 10 10 10  10 10 10 10     Manual Therapy 97140 MT 15 15 15 15  15 15 15 10     Ultrasound 97035  [place]             Therapeutic exercise 97110 TE 35 35 25 30 20 30 30 30  35    Therapeutic Activity 97530 TA 15 15 5 10   10 10 10     Neuromuscular Reeducation 97112  NMR             TOTAL TREATMENT TIME  (untimed+timed minutes)  95 85 55 65 40 55 65 65 65    TIMED CODE TREATMENT  (timed code minutes-  shared minutes)  65 65 45 55 20 45 55 55 55    ** Medicare-add KX at 5th visit if PT&ST/10h visit if PT only**           Add KX    Therapist's Initials  AW AW KR HDB KR KR HDB AW HDB               Lower Extremity Therapy Flow Sheet  Lilton Nolasco     Classify Intervention   1 2 3 4 5 6 7 8 9 10    Date:  09/28/18 09/30/18 10/02/2018 10/04/2018 10/11/18 10/13/2018 10/15/18 10/18/18 10/20/2018 10/22/18   Non timed procedure              Evaluation/re-eval/  D/C  IE            Moist heat/cold pack   MH/CP MH x10' supine BOS and EOS  bolster under knee BOS xx xx X    Declined EOS x x x x declined   Electrical Stimulation   ES             Timed procedure                STM/MFR MT p scar (once steri strips off), retro for swelling Retro knee for swelling X plus quad in sitting x x X plus GT3 x Post knee in straight  incision and quad in flex x x   Joint mobilization MT p patella x x x x x x x     Manual stretching/PassiveROM MT p flex, ext x X flex in sitting with tibial IR and distraction x x X plus ext in supine x 10 each x Ext/flex in supine and prone X ext in supine, flex in supine and seated x 10 each xx   Ultrasound [place]  Exercise/Activity              Quad set TE 20x5" x x x x x x x  20x10" on 1/2 foam   Heel slides TE x20 with SOS x10 x X 20  x x x Blue pball under heel  With strap  20/5" x With SOS 15x10"   Hip add ball/ Hip abd Tband TE p Ball squeeze 20x5"    UBKF green x20 xx DC ball sq        x UBKF green x20 x x UBKF  BTB  20/5"   X black x   Bridges with ball squeeze TE p   X 20 x x x W/ ball  x20 DC    March>SLR TE p       SLR  10/3" x    SAQ/LAQ TE x20 ea xx xx X LAQ xx xx xx Both  x20ea xx 1# ea   Prone knee hang TE         2'    Sidelying hip abd/clams TE p            Prone ham curls TE p       x20  2 Towel folded thigh x    Strap Hamstring/Quad stretch TE p   2 x 30" HS X   X  Plus prone quad stretch 2 x 30" xx Calf/ham  2/30sec X HS and prone quad 3 x 30" each x   Stair Stretches TE p stair stretches flex, ext  10 x 5" each x 3x20" ea 10 x 5" 3x20" ea x xx 10x10" ea   Standing alternating-  marches, ham curls, hip abd, hip ext TA p        X 20 ext, abd with red band    Minisquats/HR TA  p   X 20  HR x20 x  x Xx plus mini squats    Tandem>SLS NMR p            Wobbleboard/BAPS NMR             TKE TA p       GTB  10/10"  p   Sidestepping TA p            Ascending/descending stairs reciprocally TA   4" x 5          Forward step ups/Lateral Step ups TA p  FSU 6" x 10   FSU 6" x 10  LSU 6" x 10 X 20 x20 ea 8"  xx Both 8"  x20ea xx    Forward step downs>dips TA p     4" x 20 x FSD 4" x20 x    Cable column/Totalgym TE             LegPress TE             Recumbent bike/elliptical TE p RB seat 12 rocking x8' X seat 11 x Full rev x10' x x M2 seat 11  x10 x BOS   Treadmill/Elliptical TE                           Gait training GT             HEP updated  As above; printout provided    Educated on swelling and elevation            Gait training 18841 GT  Moist Heat/Cold Pack 16109   EStim 97014     MH/CP    ES  20 20 20 10 10 10 10 10     Manual Therapy 97140 MT  20 15 15 15 15 15 10 15 15    Ultrasound [id number]  [place]             Therapeutic exercise 97110 TE 25 40 38 30 25 30 30 35 35 40    Therapeutic Activity 97530 TA   15 15 15 15 10 10 15     Neuromuscular Reeducation 97112  NMR             TOTAL TREATMENT TIME  (untimed+timed minutes)  55 80 88 90 65 70 65 65 85 55    TIMED CODE TREATMENT  (timed code minutes-  shared minutes)  25 60 68 60 55 60 55 55 65 55    ** Medicare-add KX at 5th visit if PT&ST/10h visit if PT only**           Add KX    Therapist's Initials  KR KR AW AW KR AW KR HDB AW KR

## 2018-11-15 ENCOUNTER — Inpatient Hospital Stay: Admit: 2018-11-15 | Payer: PRIVATE HEALTH INSURANCE | Primary: Family Medicine

## 2018-11-15 NOTE — Progress Notes (Signed)
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"     OBJECTIVE    []  Patient is post-surgical and on a strict surgical protocol that must be followed per MD instructions and to benefit the patient's successful recovery, so treatment started today    Modality: [x]  see flow sheet      Therapeutic Activities: [x]  see flow sheet     []  Other:_  Manual Therapy:  []  see flow sheet     []  Other:_    Patient Education: [x]  Review HEP    []  Progressed/Changed HEP  []  positioning   []  posture/body mechanics   []  transfers   []  heat/ice application    Other Objective/Functional Measures:  []  see evaluation/re-evaluation/progress         ASSESSMENT  []   See evaluation/progress note/reevaluation    Patient given cued to stretch slowly and only to pull not pain. End ranges of extension and flexion very springy/elastically. Much improved flexion with swing phase-can no longer stretch in sitting because of increased range. Started working on FSD on 8".  Short Term Goals: To be accomplished in 3 weeks:  1)      The patient will demonstrate independence with HEP.  2)      The patient will ambulate with proper mechanics during stance  phase of gait with full terminal hip/knee extension and 4/5 strength >200 ft.  Long Term Goals: To be accomplished in 6 weeks:   1)      The patient will be able to return to all household and full work activities without pain as demonstrated by LEFS>60.  2)      The patient will ascend/descend 1 flight of stairs with reciprocal pattern secondary to full LE ROM and 5/5 strength.  3)      The patient will be able to stand for 60 minutes without pain secondary to strength 5/5 in order to perform woodworking.   []   Patient will continue to benefit from skilled therapy to address remaining goals     PLAN  [x]   Upgrade activities as tolerated      [x]  Frequency/duration: 3x/week for 6 weeks  POC ends (date): 12/01/18  [x]   Continue plan of care    []   Discharge due to:_         Lanetta Inch, PT  11/15/2018  Lower Extremity Therapy Flow Sheet  Joel Graham     Classify Intervention   11 12 13 14 15 16 17 18 19 20    Date:  10/25/2018 10/27/2018 10/29/18 11/01/17 11/03/18 11/05/18 11/08/18 11/10/2018 11/12/18 11/15/18   Non timed procedure              Evaluation/re-eval/  D/C      RE        Moist heat/cold pack   MH/CP MH x10' supine x x x  x MH x10 x x x   Electrical Stimulation   ES             Timed procedure                STM/MFR MT Post knee in straight  incision and quad in flex x x Distal quad/incision in full flex   Distal quad  In full flex supine Quad and hamstring STM Ham/quad in flex x   Joint mobilization MT p patella X inf/sup x    Mulligan belt seated distraction with flex  Manual distraction Mulligan  seated flex/distract Mulligan seated distract   Manual stretching/PassiveROM MT X ext in supine, flex in supine and seated x 10 each x x Ext/flex  Both supine/prone  x x Flex in seated ext in supine All 3 positions Supine/prone   Ultrasound [place]             Exercise/Activity              Quad set TE 20 x 10" x Heel on foam Foam   20/10"  x x x x 1/2 foam heel  20/5"   Heel slides TE X 20 with strap, foot on ball X no ball 10x10" Ball/strap  X10/10"  x x x 20/5" x   UBKFO TE Black x 20 x  BLK  tband  x20ea   BLK x20ea x BLK  x20ea x   Bridges/ball TE         Bridges w/ ball  x10 x20   SLR TE X 10 x  2x10   1.5# x10  x 1.5#x15   SAQ/LAQ TE SAQ x 2 p  LAQ x 20 X 20 LAQ  1.5# both x10ea  Focus quad set  2# ea 2.5# LAQ   x10 2 x 10 2.5# LAQ  x20 2.5#x20   Prone knee hang TE 2' x  x2'   x2' x x x   Quadriped knee flex stretch TE p      10/5" x x x   Prone ham curls TE X 20 towel under thigh x  x20   x10A  x10AA x x10 A  x10 AA x20 A   Strap Hamstring/Quad stretch TE 3 x 30" each x  x   2/30sec x Supine ham/  Prone quad 2/30sec x   Stair Stretches TE  flex, ext 10 x 10" each x xx xx xx xx xx  xx xx   Standing alternating-hip abd, hip ext TA Red x 20 each x           Minisquats/HR TA X 20 each x  x20ea     Calf raises on 1/2 roll  x20 1/2 roll x20   TKE TA   Green 10x10" BTB  20/10" x x BTB  20/10" x BLK  20/10" BLK x20   Sidestepping TA p            Ascending/descending stairs reciprocally TA  Forward step ups/Lateral Step ups TA 8" x 20 each x  Both  x20 ea         Forward step downs>dips TA 4" x 20 x  x   6" FSD  x10  6" FSD  X 20 8" FSD  x10   Cable column/Totalgym TE             LegPress TE             Recumbent bike/elliptical TE Seat 11 x 10 x Seat 9 for flex Seat 10  x10' Seat 9 for flex X EOS x10 EOS  Seat 9  M5 X rec elliptical seat 7 RB seat 9  x10 RB 9 x10'   Gait training GT             HEP updated              Gait training [id number] GT              Moist Heat/Cold Pack 97010   EStim 97014     MH/CP    ES 10 10 10 10  10 10 10 10 10    Manual Therapy 97140 MT 15 15 15 15  15 15 15 10 10    Ultrasound 97035  [place]             Therapeutic exercise 97110 TE 35 35 25 30 20 30 30 30 35 35    Therapeutic Activity 97530 TA 15 15 5 10   10 10 10 10    Neuromuscular Reeducation 97112  NMR             TOTAL TREATMENT TIME   (untimed+timed minutes)  95 85 55 65 40 55 65 65 65 65    TIMED CODE TREATMENT  (timed code minutes-  shared minutes)  65 65 45 55 20 45 55 55 55 55    ** Medicare-add KX at 5th visit if PT&ST/10h visit if PT only**           Add KX    Therapist's Initials  AW AW KR HDB KR KR HDB AW HDB HDB             Lower Extremity Therapy Flow Sheet  Joel Graham     Classify Intervention   1 2 3 4 5 6 7 8 9 10    Date:  09/28/18 09/30/18 10/02/2018 10/04/2018 10/11/18 10/13/2018 10/15/18 10/18/18 10/20/2018 10/22/18   Non timed procedure              Evaluation/re-eval/  D/C  IE            Moist heat/cold pack   MH/CP MH x10' supine BOS and EOS  bolster under knee BOS xx xx X    Declined EOS x x x x declined   Electrical Stimulation   ES             Timed procedure                STM/MFR MT p scar (once steri strips off), retro for swelling Retro knee for swelling X plus quad in sitting x x X plus GT3 x Post knee in straight  incision and quad in flex x x   Joint mobilization MT p patella x x x x x x x     Manual stretching/PassiveROM MT p flex, ext x X flex in sitting with tibial IR and distraction x x X plus ext in supine x 10 each x  Ext/flex in supine and prone X ext in supine, flex in supine and seated x 10 each xx   Ultrasound [place]             Exercise/Activity              Quad set TE 20x5" x x x x x x x  20x10" on 1/2 foam   Heel slides TE x20 with SOS x10 x X 20  x x x Blue pball under heel  With strap  20/5" x With SOS 15x10"   Hip add ball/ Hip abd Tband TE p Ball squeeze 20x5"    UBKF green x20 xx DC ball sq        x UBKF green x20 x x UBKF  BTB  20/5"   X black x   Bridges with ball squeeze TE p   X 20 x x x W/ ball  x20 DC    March>SLR TE p       SLR  10/3" x    SAQ/LAQ TE x20 ea xx xx X LAQ xx xx xx Both  x20ea xx 1# ea   Prone knee hang TE         2'    Sidelying hip abd/clams TE p            Prone ham curls TE p       x20  2 Towel folded thigh x    Strap Hamstring/Quad stretch TE p   2 x 30" HS X   X   Plus prone quad stretch 2 x 30" xx Calf/ham  2/30sec X HS and prone quad 3 x 30" each x   Stair Stretches TE p stair stretches flex, ext  10 x 5" each x 3x20" ea 10 x 5" 3x20" ea x xx 10x10" ea   Standing alternating-  marches, ham curls, hip abd, hip ext TA p        X 20 ext, abd with red band    Minisquats/HR TA p   X 20  HR x20 x  x Xx plus mini squats    Tandem>SLS NMR p            Wobbleboard/BAPS NMR             TKE TA p       GTB  10/10"  p   Sidestepping TA p            Ascending/descending stairs reciprocally TA   4" x 5          Forward step ups/Lateral Step ups TA p  FSU 6" x 10   FSU 6" x 10  LSU 6" x 10 X 20 x20 ea 8"  xx Both 8"  x20ea xx    Forward step downs>dips TA p     4" x 20 x FSD 4" x20 x    Cable column/Totalgym TE             LegPress TE             Recumbent bike/elliptical TE p RB seat 12 rocking x8' X seat 11 x Full rev x10' x x M2 seat 11  x10 x BOS   Treadmill/Elliptical TE                           Gait training GT             HEP  updated  As above; printout provided    Educated on swelling and elevation            Gait training 97116 GT              Moist Heat/Cold Pack 97010   EStim 97014     MH/CP    ES  20 20 20 10 10 10 10 10     Manual Therapy 97140 MT  20 15 15 15 15 15 10 15 15    Ultrasound 97035  [place]             Therapeutic exercise 97110 TE 25 40 38 30 25 30 30 35 35 40    Therapeutic Activity 97530 TA   15 15 15 15 10 10 15     Neuromuscular Reeducation 97112  NMR             TOTAL TREATMENT TIME  (untimed+timed minutes)  55 80 88 90 65 70 65 65 85 55    TIMED CODE TREATMENT  (timed code minutes-  shared minutes)  25 60 68 60 55 60 55 55 65 55    ** Medicare-add KX at 5th visit if PT&ST/10h visit if PT only**           Add KX    Therapist's Initials  KR KR AW AW KR AW KR HDB AW KR

## 2018-11-15 NOTE — Progress Notes (Signed)
 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]   Referral Source: [name], MD     Date:11/15/2018  Visit #: 20     Treatment Area: L TKR  Date of Surgery: 09/08/18  Protocol  []  Yes  Precautions/Restrictions:  Patients prefers to be treated behind curtain when appropriate:  []  Yes   Patient has therapist preference:   []  Male         []  Male         SUBJECTIVE  Pain Level (0-10 scale):               Pain Level (0-10 scale) post treatment:   Medication Changes: []  No    []  Yes (see paper medication log)  Subjective functional status/changes:   []  No changes reported    Patient asked  When do going down the stairs feel better?     OBJECTIVE    []  Patient is post-surgical and on a strict surgical protocol that must be followed per MD instructions and to benefit the patient's successful recovery, so treatment started today    Modality: [x]  see flow sheet      Therapeutic Activities: [x]  see flow sheet     []  Other:_  Manual Therapy:  []  see flow sheet     []  Other:_    Patient Education: [x]  Review HEP    []  Progressed/Changed HEP  []  positioning   []  posture/body mechanics   []  transfers   []  heat/ice application    Other Objective/Functional Measures:  []  see evaluation/re-evaluation/progress         ASSESSMENT  []   See evaluation/progress note/reevaluation    Patient given cued to stretch slowly and only to pull not pain. End ranges of extension and flexion very springy/elastically. Much improved flexion with swing phase-can no longer stretch in sitting because of increased range. Started working on FSD on 8.  Short Term Goals: To be accomplished in 3 weeks:  1)      The patient will demonstrate independence with HEP.  2)      The patient will ambulate with proper mechanics during stance phase of gait with full terminal hip/knee  extension and 4/5 strength >200 ft.  Long Term Goals: To be accomplished in 6 weeks:   1)      The patient will be able to return to all household and full work activities without pain as demonstrated by LEFS>60.  2)      The patient will ascend/descend 1 flight of stairs with reciprocal pattern secondary to full LE ROM and 5/5 strength.  3)      The patient will be able to stand for 60 minutes without pain secondary to strength 5/5 in order to perform woodworking.   []   Patient will continue to benefit from skilled therapy to address remaining goals     PLAN  [x]   Upgrade activities as tolerated      [x]  Frequency/duration: 3x/week for 6 weeks  POC ends (date): 12/01/18  [x]   Continue plan of care    []   Discharge due to:_         Rolland JONETTA Fam, PT  11/15/2018  Lower Extremity Therapy Flow Sheet  Joel Graham     Classify Intervention   11 12 13 14 15 16 17 18 19 20    Date:  10/25/2018 10/27/2018 10/29/18 11/01/17 11/03/18 11/05/18 11/08/18 11/10/2018 11/12/18 11/15/18   Non timed procedure              Evaluation/re-eval/  D/C      RE        Moist heat/cold pack   MH/CP MH x10' supine x x x  x MH x10 x x x   Electrical Stimulation   ES             Timed procedure                STM/MFR MT Post knee in straight  incision and quad in flex x x Distal quad/incision in full flex   Distal quad  In full flex supine Quad and hamstring STM Ham/quad in flex x   Joint mobilization MT p patella X inf/sup x    Mulligan belt seated distraction with flex  Manual distraction Mulligan  seated flex/distract Mulligan seated distract   Manual stretching/PassiveROM MT X ext in supine, flex in supine and seated x 10 each x x Ext/flex  Both supine/prone  x x Flex in seated ext in supine All 3 positions Supine/prone   Ultrasound US              Exercise/Activity              Quad set TE 20 x 10 x Heel on foam Foam  20/10  x x x x 1/2 foam heel  20/5   Heel slides TE X 20  with strap, foot on ball X no ball 10x10 Ball/strap  X10/10  x x x 20/5 x   UBKFO TE Black x 20 x  BLK  tband  x20ea   BLK x20ea x BLK  x20ea x   Bridges/ball TE         Bridges w/ ball  x10 x20   SLR TE X 10 x  2x10   1.5# x10  x 1.5#x15   SAQ/LAQ TE SAQ x 2 p  LAQ x 20 X 20 LAQ  1.5# both x10ea  Focus quad set  2# ea 2.5# LAQ   x10 2 x 10 2.5# LAQ  x20 2.5#x20   Prone knee hang TE 2' x  x2'   x2' x x x   Quadriped knee flex stretch TE p      10/5 x x x   Prone ham curls TE X 20 towel under thigh x  x20   x10A  x10AA x x10 A  x10 AA x20 A   Strap Hamstring/Quad stretch TE 3 x 30 each x  x   2/30sec x Supine ham/  Prone quad 2/30sec x   Stair Stretches TE  flex, ext 10 x 10 each x xx xx xx xx xx  xx xx   Standing alternating-hip abd, hip ext TA Red x 20 each x           Minisquats/HR TA X 20 each x  x20ea     Calf raises on 1/2 roll  x20 1/2 roll x20   TKE TA   Green 10x10 BTB  20/10 x x BTB  20/10 x BLK  20/10 BLK x20   Sidestepping TA p            Ascending/descending stairs reciprocally TA  Forward step ups/Lateral Step ups TA 8 x 20 each x  Both  x20 ea         Forward step downs>dips TA 4 x 20 x  x   6 FSD  x10  6 FSD  X 20 8 FSD  x10   Cable column/Totalgym TE             LegPress TE             Recumbent bike/elliptical TE Seat 11 x 10 x Seat 9 for flex Seat 10  x10' Seat 9 for flex X EOS x10 EOS  Seat 9  M5 X rec elliptical seat 7 RB seat 9  x10 RB 9 x10'   Gait training GT             HEP updated              Gait training 97116 GT              Moist Heat/Cold Pack 02989   EStim 97014     MH/CP    ES 10 10 10 10  10 10 10 10 10    Manual Therapy 97140 MT 15 15 15 15  15 15 15 10 10    Ultrasound 97035  US              Therapeutic exercise 97110 TE 35 35 25 30 20 30 30 30 35 35    Therapeutic Activity 97530 TA 15 15 5 10   10 10 10 10    Neuromuscular Reeducation 97112  NMR             TOTAL TREATMENT TIME  (untimed+timed minutes)  95 85 55 65 40 55 65 65 65 65    TIMED CODE  TREATMENT  (timed code minutes-  shared minutes)  65 65 45 55 20 45 55 55 55 55    ** Medicare-add KX at 5th visit if PT&ST/10h visit if PT only**           Add KX    Therapist's Initials  AW AW KR HDB KR KR HDB AW HDB HDB             Lower Extremity Therapy Flow Sheet  Joel Graham     Classify Intervention   1 2 3 4 5 6 7 8 9 10    Date:  09/28/18 09/30/18 10/02/2018 10/04/2018 10/11/18 10/13/2018 10/15/18 10/18/18 10/20/2018 10/22/18   Non timed procedure              Evaluation/re-eval/  D/C  IE            Moist heat/cold pack   MH/CP MH x10' supine BOS and EOS  bolster under knee BOS xx xx X    Declined EOS x x x x declined   Electrical Stimulation   ES             Timed procedure                STM/MFR MT p scar (once steri strips off), retro for swelling Retro knee for swelling X plus quad in sitting x x X plus GT3 x Post knee in straight  incision and quad in flex x x   Joint mobilization MT p patella x x x x x x x     Manual stretching/PassiveROM MT p flex, ext x X flex in sitting with tibial IR and distraction x x X plus ext in supine x 10 each x  Ext/flex in supine and prone X ext in supine, flex in supine and seated x 10 each xx   Ultrasound US              Exercise/Activity              Quad set TE 20x5 x x x x x x x  20x10 on 1/2 foam   Heel slides TE x20 with SOS x10 x X 20  x x x Blue pball under heel  With strap  20/5 x With SOS 15x10   Hip add ball/ Hip abd Tband TE p Ball squeeze 20x5    UBKF green x20 xx DC ball sq        x UBKF green x20 x x UBKF  BTB  20/5   X black x   Bridges with ball squeeze TE p   X 20 x x x W/ ball  x20 DC    March>SLR TE p       SLR  10/3 x    SAQ/LAQ TE x20 ea xx xx X LAQ xx xx xx Both  x20ea xx 1# ea   Prone knee hang TE         2'    Sidelying hip abd/clams TE p            Prone ham curls TE p       x20  2 Towel folded thigh x    Strap Hamstring/Quad stretch TE p   2 x 30 HS X   X  Plus prone quad stretch 2 x 30 xx Calf/ham  2/30sec X HS and prone quad 3 x 30 each x    Stair Stretches TE p stair stretches flex, ext  10 x 5 each x 3x20 ea 10 x 5 3x20 ea x xx 10x10 ea   Standing alternating-  marches, ham curls, hip abd, hip ext TA p        X 20 ext, abd with red band    Minisquats/HR TA p   X 20  HR x20 x  x Xx plus mini squats    Tandem>SLS NMR p            Wobbleboard/BAPS NMR             TKE TA p       GTB  10/10  p   Sidestepping TA p            Ascending/descending stairs reciprocally TA   4 x 5          Forward step ups/Lateral Step ups TA p  FSU 6 x 10   FSU 6 x 10  LSU 6 x 10 X 20 x20 ea 8  xx Both 8  x20ea xx    Forward step downs>dips TA p     4 x 20 x FSD 4 x20 x    Cable column/Totalgym TE             LegPress TE             Recumbent bike/elliptical TE p RB seat 12 rocking x8' X seat 11 x Full rev x10' x x M2 seat 11  x10 x BOS   Treadmill/Elliptical TE                           Gait training GT             HEP  updated  As above; printout provided    Educated on swelling and elevation            Gait training 02883 GT              Moist Heat/Cold Pack 97010   EStim 97014     MH/CP    ES  20 20 20 10 10 10 10 10     Manual Therapy 97140 MT  20 15 15 15 15 15 10 15 15    Ultrasound 97035  US              Therapeutic exercise 97110 TE 25 40 38 30 25 30 30 35 35 40    Therapeutic Activity 97530 TA   15 15 15 15 10 10 15     Neuromuscular Reeducation 97112  NMR             TOTAL TREATMENT TIME  (untimed+timed minutes)  55 80 88 90 65 70 65 65 85 55    TIMED CODE TREATMENT  (timed code minutes-  shared minutes)  25 60 68 60 55 60 55 55 65 55    ** Medicare-add KX at 5th visit if PT&ST/10h visit if PT only**           Add KX    Therapist's Initials  KR KR AW AW KR AW KR HDB AW KR

## 2018-11-17 ENCOUNTER — Inpatient Hospital Stay: Admit: 2018-11-17 | Payer: PRIVATE HEALTH INSURANCE | Primary: Family Medicine

## 2018-11-17 NOTE — Progress Notes (Signed)
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]   Referral Source: Joycie Peek., MD     Date:11/17/2018  Visit #: 21     Treatment Area: L TKR  Date of Surgery: 09/08/18  Protocol  []  Yes  Precautions/Restrictions:  Patients prefers to be treated behind curtain when appropriate:  []  Yes   Patient has therapist preference:   []  Male         []  Male         SUBJECTIVE  Pain Level (0-10 scale):               Pain Level (0-10 scale) post treatment:   Medication Changes: []  No    []  Yes (see paper medication log)  Subjective functional status/changes:   []  No changes reported    Patient states his leg was spasming getting out of the truck.  It was happening a lot last night on the back of his leg.     OBJECTIVE    []  Patient is post-surgical and on a strict surgical protocol that must be followed per MD instructions and to benefit the patient's successful recovery, so treatment started today    Modality: [x]  see flow sheet      Therapeutic Activities: [x]  see flow sheet     []  Other:_  Manual Therapy:  []  see flow sheet     []  Other:_    Patient Education: [x]  Review HEP    []  Progressed/Changed HEP  []  positioning   []  posture/body mechanics   []  transfers   []  heat/ice application    Other Objective/Functional Measures:  []  see evaluation/re-evaluation/progress         ASSESSMENT  []   See evaluation/progress note/reevaluation    Patient with increased spasm activity at initiation of session.  Recommended patient not perform hamstring curls at home as hamstrings demonstrating increased tone.      Short Term Goals: To be accomplished in 3 weeks:  1)      The patient will demonstrate independence with HEP.  2)      The patient will ambulate with proper mechanics during stance phase of gait with full terminal hip/knee extension and 4/5 strength >200  ft.  Long Term Goals: To be accomplished in 6 weeks:   1)      The patient will be able to return to all household and full work activities without pain as demonstrated by LEFS>60.  2)      The patient will ascend/descend 1 flight of stairs with reciprocal pattern secondary to full LE ROM and 5/5 strength.  3)      The patient will be able to stand for 60 minutes without pain secondary to strength 5/5 in order to perform woodworking.   []   Patient will continue to benefit from skilled therapy to address remaining goals     PLAN  [x]   Upgrade activities as tolerated      [x]  Frequency/duration: 3x/week for 6 weeks  POC ends (date): 12/01/18  [x]   Continue plan of care    []   Discharge due to:_         [name]  11/17/2018  Lower Extremity Therapy Flow Sheet  Joel Graham     Classify Intervention   21            Date:  11/17/2018            Non timed procedure              Evaluation/re-eval/  D/C              Moist heat/cold pack   MH/CP MH x10' supine            Electrical Stimulation   ES             Timed procedure                STM/MFR MT Quad and hamstring STM            Joint mobilization MT patella inf/sup grade 4 x 10 each            Manual stretching/PassiveROM MT X ext in supine            Ultrasound [place]             Exercise/Activity              Quad set TE 20 x 10"            Heel slides TE X 20 with strap, foot on ball            UBKFO TE Black x 20            Bridges with ball TE X 20             SLR TE 1.5# X 15            LAQ TE 2.5# x 20            Prone knee hang TE 2'            Quadriped knee flex stretch TE 10 x 5"            Prone ham curls TE X 20 towel under thigh            Strap Hamstring/Quad stretch TE Supine ham/  Prone quad 2/30sec            Stair Stretches TE  flex, ext 10 x 10" each            Standing alternating-hip abd, hip ext TA Red x 20 each            Minisquats/HR TA X 20 each   HR on 1/2 roll            TKE TA Black 20 x 5"            Sidestepping TA p            Ascending/descending stairs reciprocally TA             Forward step ups/Lateral Step ups TA             Forward step downs>dips TA 8" x 20            Cable column/Totalgym TE             LegPress TE             Recumbent bike/elliptical TE Seat 9 x 10'            Gait training GT  HEP updated              Gait training 28206 GT              Moist Heat/Cold Pack 97010   EStim 97014     MH/CP    ES 10            Manual Therapy 97140 MT 15            Ultrasound 97035  [place]             Therapeutic exercise 97110 TE 35            Therapeutic Activity 97530 TA 15            Neuromuscular Reeducation 97112  NMR             TOTAL TREATMENT TIME  (untimed+timed minutes)  95            TIMED CODE TREATMENT  (timed code minutes-  shared minutes)  65            ** Medicare-add KX at 5th visit if PT&ST/10h visit if PT only**           Add KX    Therapist's Initials  AW                                     Lower Extremity Therapy Flow Sheet  Joel Graham     Classify Intervention   11 12 13 14 15 16 17 18 19 20    Date:  10/25/2018 10/27/2018 10/29/18 11/01/17 11/03/18 11/05/18 11/08/18 11/10/2018 11/12/18 11/15/18   Non timed procedure              Evaluation/re-eval/  D/C      RE        Moist heat/cold pack   MH/CP MH x10' supine x x x  x MH x10 x x x   Electrical Stimulation   ES             Timed procedure                STM/MFR MT Post knee in straight  incision and quad in flex x x Distal quad/incision in full flex   Distal quad  In full flex supine Quad and hamstring STM Ham/quad in flex x   Joint mobilization MT p patella X inf/sup x    Mulligan belt seated distraction with flex  Manual distraction Mulligan  seated flex/distract Mulligan seated distract   Manual stretching/PassiveROM MT X ext in supine, flex in supine and seated x 10 each x x Ext/flex  Both supine/prone  x x Flex in seated ext in supine All 3 positions Supine/prone    Ultrasound [place]             Exercise/Activity              Quad set TE 20 x 10" x Heel on foam Foam  20/10"  x x x x 1/2 foam heel  20/5"   Heel slides TE X 20 with strap, foot on ball X no ball 10x10" Ball/strap  X10/10"  x x x 20/5" x   UBKFO TE Black x 20 x  BLK  tband  x20ea   BLK x20ea x BLK  x20ea x   Bridges/ball TE         Bridges w/ ball  x10  x20   SLR TE X 10 x  2x10   1.5# x10  x 1.5#x15   SAQ/LAQ TE SAQ x 2 p  LAQ x 20 X 20 LAQ  1.5# both x10ea  Focus quad set  2# ea 2.5# LAQ   x10 2 x 10 2.5# LAQ  x20 2.5#x20   Prone knee hang TE 2' x  x2'   x2' x x x   Quadriped knee flex stretch TE p      10/5" x x x   Prone ham curls TE X 20 towel under thigh x  x20   x10A  x10AA x x10 A  x10 AA x20 A   Strap Hamstring/Quad stretch TE 3 x 30" each x  x   2/30sec x Supine ham/  Prone quad 2/30sec x   Stair Stretches TE  flex, ext 10 x 10" each x xx xx xx xx xx  xx xx   Standing alternating-hip abd, hip ext TA Red x 20 each x           Minisquats/HR TA X 20 each x  x20ea     Calf raises on 1/2 roll  x20 1/2 roll x20   TKE TA   Green 10x10" BTB  20/10" x x BTB  20/10" x BLK  20/10" BLK x20   Sidestepping TA p            Ascending/descending stairs reciprocally TA             Forward step ups/Lateral Step ups TA 8" x 20 each x  Both  x20 ea         Forward step downs>dips TA 4" x 20 x  x   6" FSD  x10  6" FSD  X 20 8" FSD  x10   Cable column/Totalgym TE             LegPress TE             Recumbent bike/elliptical TE Seat 11 x 10 x Seat 9 for flex Seat 10  x10' Seat 9 for flex X EOS x10 EOS  Seat 9  M5 X rec elliptical seat 7 RB seat 9  x10 RB 9 x10'   Gait training GT             HEP updated              Gait training 16109 GT              Moist Heat/Cold Pack 97010   EStim 97014     MH/CP    ES Manual Therapy 97140 MT Ultrasound 97035  [place]             Therapeutic exercise 97110 TE    Therapeutic Activity 97530 TA Neuromuscular Reeducation 97112  NMR             TOTAL TREATMENT TIME  (untimed+timed minutes)    TIMED CODE TREATMENT  (timed code minutes-  shared minutes)    ** Medicare-add KX at 5th visit if PT&ST/10h visit if PT only**  Add KX    Therapist's Initials  AW AW KR HDB KR KR HDB AW HDB HDB             Lower Extremity Therapy Flow Sheet  Joel Graham     Classify Intervention   1 2 3 4 5 6 7 8 9 10    Date:  09/28/18 09/30/18 10/02/2018 10/04/2018 10/11/18 10/13/2018 10/15/18 10/18/18 10/20/2018 10/22/18   Non timed procedure              Evaluation/re-eval/  D/C  IE            Moist heat/cold pack   MH/CP MH x10' supine BOS and EOS  bolster under knee BOS xx xx X    Declined EOS x x x x declined   Electrical Stimulation   ES             Timed procedure                STM/MFR MT p scar (once steri strips off), retro for swelling Retro knee for swelling X plus quad in sitting x x X plus GT3 x Post knee in straight  incision and quad in flex x x   Joint mobilization MT p patella x x x x x x x     Manual stretching/PassiveROM MT p flex, ext x X flex in sitting with tibial IR and distraction x x X plus ext in supine x 10 each x Ext/flex in supine and prone X ext in supine, flex in supine and seated x 10 each xx   Ultrasound [place]             Exercise/Activity              Quad set TE 20x5" x x x x x x x  20x10" on 1/2 foam   Heel slides TE x20 with SOS x10 x X 20  x x x Blue pball under heel  With strap  20/5" x With SOS 15x10"   Hip add ball/ Hip abd Tband TE p Ball squeeze 20x5"    UBKF green x20 xx DC ball sq        x UBKF green x20 x x UBKF  BTB  20/5"   X black x   Bridges with ball squeeze TE p   X 20 x x x W/ ball  x20 DC    March>SLR TE p       SLR  10/3" x    SAQ/LAQ TE x20 ea xx xx X LAQ xx xx xx Both  x20ea xx 1# ea   Prone knee hang TE         2'    Sidelying hip abd/clams TE p             Prone ham curls TE p       x20  2 Towel folded thigh x    Strap Hamstring/Quad stretch TE p   2 x 30" HS X   X  Plus prone quad stretch 2 x 30" xx Calf/ham  2/30sec X HS and prone quad 3 x 30" each x   Stair Stretches TE p stair stretches flex, ext  10 x 5" each x 3x20" ea 10 x 5" 3x20" ea x xx 10x10" ea   Standing alternating-  marches, ham curls, hip abd, hip ext TA p        X 20 ext, abd with red band    Minisquats/HR  TA p   X 20  HR x20 x  x Xx plus mini squats    Tandem>SLS NMR p            Wobbleboard/BAPS NMR             TKE TA p       GTB  10/10"  p   Sidestepping TA p            Ascending/descending stairs reciprocally TA   4" x 5          Forward step ups/Lateral Step ups TA p  FSU 6" x 10   FSU 6" x 10  LSU 6" x 10 X 20 x20 ea 8"  xx Both 8"  x20ea xx    Forward step downs>dips TA p     4" x 20 x FSD 4" x20 x    Cable column/Totalgym TE             LegPress TE             Recumbent bike/elliptical TE p RB seat 12 rocking x8' X seat 11 x Full rev x10' x x M2 seat 11  x10 x BOS   Treadmill/Elliptical TE                           Gait training GT             HEP updated  As above; printout provided    Educated on swelling and elevation            Gait training 16109 GT              Moist Heat/Cold Pack 97010   EStim 97014     MH/CP    ES  Manual Therapy 97140 MT  Ultrasound 97035  [place]             Therapeutic exercise 97110 TE   Therapeutic Activity 97530 TA   Neuromuscular Reeducation 97112  NMR             TOTAL TREATMENT TIME  (untimed+timed minutes)    TIMED CODE TREATMENT  (timed code minutes-  shared minutes)    ** Medicare-add KX at 5th visit if PT&ST/10h visit if PT only**           Add KX    Therapist's Initials  KR KR AW AW KR AW KR HDB AW KR

## 2018-11-17 NOTE — Progress Notes (Signed)
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]   Referral Source: Joycie Peek., MD     Date:11/17/2018  Visit #: 21     Treatment Area: L TKR  Date of Surgery: 09/08/18  Protocol  []  Yes  Precautions/Restrictions:  Patients prefers to be treated behind curtain when appropriate:  []  Yes   Patient has therapist preference:   []  Male         []  Male         SUBJECTIVE  Pain Level (0-10 scale):               Pain Level (0-10 scale) post treatment:   Medication Changes: []  No    []  Yes (see paper medication log)  Subjective functional status/changes:   []  No changes reported    Patient states his leg was spasming getting out of the truck.  It was happening a lot last night on the back of his leg.     OBJECTIVE    []  Patient is post-surgical and on a strict surgical protocol that must be followed per MD instructions and to benefit the patient's successful recovery, so treatment started today    Modality: [x]  see flow sheet      Therapeutic Activities: [x]  see flow sheet     []  Other:_  Manual Therapy:  []  see flow sheet     []  Other:_    Patient Education: [x]  Review HEP    []  Progressed/Changed HEP  []  positioning   []  posture/body mechanics   []  transfers   []  heat/ice application    Other Objective/Functional Measures:  []  see evaluation/re-evaluation/progress         ASSESSMENT  []   See evaluation/progress note/reevaluation    Patient with increased spasm activity at initiation of session.  Recommended patient not perform hamstring curls at home as hamstrings demonstrating increased tone.      Short Term Goals: To be accomplished in 3 weeks:  1)      The patient will demonstrate independence with HEP.  2)      The patient will ambulate with proper mechanics during stance phase of gait with full terminal hip/knee extension and 4/5 strength >200  ft.  Long Term Goals: To be accomplished in 6 weeks:   1)      The patient will be able to return to all household and full work activities without pain as demonstrated by LEFS>60.  2)      The patient will ascend/descend 1 flight of stairs with reciprocal pattern secondary to full LE ROM and 5/5 strength.  3)      The patient will be able to stand for 60 minutes without pain secondary to strength 5/5 in order to perform woodworking.   []   Patient will continue to benefit from skilled therapy to address remaining goals     PLAN  [x]   Upgrade activities as tolerated      [x]  Frequency/duration: 3x/week for 6 weeks  POC ends (date): 12/01/18  [x]   Continue plan of care    []   Discharge due to:_         [name]  11/17/2018  Lower Extremity Therapy Flow Sheet  Joel Graham     Classify Intervention   21            Date:  11/17/2018            Non timed procedure              Evaluation/re-eval/  D/C              Moist heat/cold pack   MH/CP MH x10' supine            Electrical Stimulation   ES             Timed procedure                STM/MFR MT Quad and hamstring STM            Joint mobilization MT patella inf/sup grade 4 x 10 each            Manual stretching/PassiveROM MT X ext in supine            Ultrasound [place]             Exercise/Activity              Quad set TE 20 x 10"            Heel slides TE X 20 with strap, foot on ball            UBKFO TE Black x 20            Bridges with ball TE X 20             SLR TE 1.5# X 15            LAQ TE 2.5# x 20            Prone knee hang TE 2'            Quadriped knee flex stretch TE 10 x 5"            Prone ham curls TE X 20 towel under thigh            Strap Hamstring/Quad stretch TE Supine ham/  Prone quad 2/30sec            Stair Stretches TE  flex, ext 10 x 10" each            Standing alternating-hip abd, hip ext TA Red x 20 each            Minisquats/HR TA X 20 each  HR on 1/2  roll            TKE TA Black 20 x 5"            Sidestepping TA p            Ascending/descending stairs reciprocally TA             Forward step ups/Lateral Step ups TA             Forward step downs>dips TA 8" x 20            Cable column/Totalgym TE             LegPress TE             Recumbent bike/elliptical TE Seat 9 x 10'            Gait training GT  HEP updated              Gait training 57846 GT              Moist Heat/Cold Pack 97010   EStim 97014     MH/CP    ES 10            Manual Therapy 97140 MT 15            Ultrasound 97035  [place]             Therapeutic exercise 97110 TE 35            Therapeutic Activity 97530 TA 15            Neuromuscular Reeducation 97112  NMR             TOTAL TREATMENT TIME  (untimed+timed minutes)  95            TIMED CODE TREATMENT  (timed code minutes-  shared minutes)  65            ** Medicare-add KX at 5th visit if PT&ST/10h visit if PT only**           Add KX    Therapist's Initials  AW                                     Lower Extremity Therapy Flow Sheet  Joel Graham     Classify Intervention   11 12 13 14 15 16 17 18 19 20    Date:  10/25/2018 10/27/2018 10/29/18 11/01/17 11/03/18 11/05/18 11/08/18 11/10/2018 11/12/18 11/15/18   Non timed procedure              Evaluation/re-eval/  D/C      RE        Moist heat/cold pack   MH/CP MH x10' supine x x x  x MH x10 x x x   Electrical Stimulation   ES             Timed procedure                STM/MFR MT Post knee in straight  incision and quad in flex x x Distal quad/incision in full flex   Distal quad  In full flex supine Quad and hamstring STM Ham/quad in flex x   Joint mobilization MT p patella X inf/sup x    Mulligan belt seated distraction with flex  Manual distraction Mulligan  seated flex/distract Mulligan seated distract   Manual stretching/PassiveROM MT X ext in supine, flex in supine and seated x 10 each x x Ext/flex  Both supine/prone  x x Flex in seated ext in supine All 3 positions Supine/prone   Ultrasound [place]              Exercise/Activity              Quad set TE 20 x 10" x Heel on foam Foam  20/10"  x x x x 1/2 foam heel  20/5"   Heel slides TE X 20 with strap, foot on ball X no ball 10x10" Ball/strap  X10/10"  x x x 20/5" x   UBKFO TE Black x 20 x  BLK  tband  x20ea   BLK x20ea x BLK  x20ea x   Bridges/ball TE         Bridges w/ ball  x10  x20   SLR TE X 10 x  2x10   1.5# x10  x 1.5#x15   SAQ/LAQ TE SAQ x 2 p  LAQ x 20 X 20 LAQ  1.5# both x10ea  Focus quad set  2# ea 2.5# LAQ   x10 2 x 10 2.5# LAQ  x20 2.5#x20   Prone knee hang TE 2' x  x2'   x2' x x x   Quadriped knee flex stretch TE p      10/5" x x x   Prone ham curls TE X 20 towel under thigh x  x20   x10A  x10AA x x10 A  x10 AA x20 A   Strap Hamstring/Quad stretch TE 3 x 30" each x  x   2/30sec x Supine ham/  Prone quad 2/30sec x   Stair Stretches TE  flex, ext 10 x 10" each x xx xx xx xx xx  xx xx   Standing alternating-hip abd, hip ext TA Red x 20 each x           Minisquats/HR TA X 20 each x  x20ea     Calf raises on 1/2 roll  x20 1/2 roll x20   TKE TA   Green 10x10" BTB  20/10" x x BTB  20/10" x BLK  20/10" BLK x20   Sidestepping TA p            Ascending/descending stairs reciprocally TA             Forward step ups/Lateral Step ups TA 8" x 20 each x  Both  x20 ea         Forward step downs>dips TA 4" x 20 x  x   6" FSD  x10  6" FSD  X 20 8" FSD  x10   Cable column/Totalgym TE             LegPress TE             Recumbent bike/elliptical TE Seat 11 x 10 x Seat 9 for flex Seat 10  x10' Seat 9 for flex X EOS x10 EOS  Seat 9  M5 X rec elliptical seat 7 RB seat 9  x10 RB 9 x10'   Gait training GT             HEP updated              Gait training 47829 GT              Moist Heat/Cold Pack 97010   EStim 97014     MH/CP    ES 10 10 10 10  10 10 10 10 10    Manual Therapy 97140 MT 15 15 15 15  15 15 15 10 10    Ultrasound 97035  [place]             Therapeutic exercise 97110 TE 35 35 25 30 20 30 30 30 35 35    Therapeutic Activity 97530 TA 15 15 5 10   10 10 10 10     Neuromuscular Reeducation 97112  NMR             TOTAL TREATMENT TIME  (untimed+timed minutes)  95 85 55 65 40 55 65 65 65 65    TIMED CODE TREATMENT  (timed code minutes-  shared minutes)  65 65 45 55 20 45 55 55 55 55    ** Medicare-add KX at 5th visit if PT&ST/10h visit if PT only**  Add KX    Therapist's Initials  AW AW KR HDB KR KR HDB AW HDB HDB             Lower Extremity Therapy Flow Sheet  Joel Graham     Classify Intervention   1 2 3 4 5 6 7 8 9 10    Date:  09/28/18 09/30/18 10/02/2018 10/04/2018 10/11/18 10/13/2018 10/15/18 10/18/18 10/20/2018 10/22/18   Non timed procedure              Evaluation/re-eval/  D/C  IE            Moist heat/cold pack   MH/CP MH x10' supine BOS and EOS  bolster under knee BOS xx xx X    Declined EOS x x x x declined   Electrical Stimulation   ES             Timed procedure                STM/MFR MT p scar (once steri strips off), retro for swelling Retro knee for swelling X plus quad in sitting x x X plus GT3 x Post knee in straight  incision and quad in flex x x   Joint mobilization MT p patella x x x x x x x     Manual stretching/PassiveROM MT p flex, ext x X flex in sitting with tibial IR and distraction x x X plus ext in supine x 10 each x Ext/flex in supine and prone X ext in supine, flex in supine and seated x 10 each xx   Ultrasound [place]             Exercise/Activity              Quad set TE 20x5" x x x x x x x  20x10" on 1/2 foam   Heel slides TE x20 with SOS x10 x X 20  x x x Blue pball under heel  With strap  20/5" x With SOS 15x10"   Hip add ball/ Hip abd Tband TE p Ball squeeze 20x5"    UBKF green x20 xx DC ball sq        x UBKF green x20 x x UBKF  BTB  20/5"   X black x   Bridges with ball squeeze TE p   X 20 x x x W/ ball  x20 DC    March>SLR TE p       SLR  10/3" x    SAQ/LAQ TE x20 ea xx xx X LAQ xx xx xx Both  x20ea xx 1# ea   Prone knee hang TE         2'    Sidelying hip abd/clams TE p            Prone ham curls TE p       x20  2 Towel folded thigh x    Strap  Hamstring/Quad stretch TE p   2 x 30" HS X   X  Plus prone quad stretch 2 x 30" xx Calf/ham  2/30sec X HS and prone quad 3 x 30" each x   Stair Stretches TE p stair stretches flex, ext  10 x 5" each x 3x20" ea 10 x 5" 3x20" ea x xx 10x10" ea   Standing alternating-  marches, ham curls, hip abd, hip ext TA p        X 20 ext, abd with red band    Minisquats/HR  TA p   X 20  HR x20 x  x Xx plus mini squats    Tandem>SLS NMR p            Wobbleboard/BAPS NMR             TKE TA p       GTB  10/10"  p   Sidestepping TA p            Ascending/descending stairs reciprocally TA   4" x 5          Forward step ups/Lateral Step ups TA p  FSU 6" x 10   FSU 6" x 10  LSU 6" x 10 X 20 x20 ea 8"  xx Both 8"  x20ea xx    Forward step downs>dips TA p     4" x 20 x FSD 4" x20 x    Cable column/Totalgym TE             LegPress TE             Recumbent bike/elliptical TE p RB seat 12 rocking x8' X seat 11 x Full rev x10' x x M2 seat 11  x10 x BOS   Treadmill/Elliptical TE                           Gait training GT             HEP updated  As above; printout provided    Educated on swelling and elevation            Gait training 30160 GT              Moist Heat/Cold Pack 97010   EStim 97014     MH/CP    ES  20 20 20 10 10 10 10 10     Manual Therapy 97140 MT  20 15 15 15 15 15 10 15 15    Ultrasound 97035  [place]             Therapeutic exercise 97110 TE 25 40 38 30 25 30 30 35 35 40    Therapeutic Activity 97530 TA   15 15 15 15 10 10 15     Neuromuscular Reeducation 97112  NMR             TOTAL TREATMENT TIME  (untimed+timed minutes)  55 80 88 90 65 70 65 65 85 55    TIMED CODE TREATMENT  (timed code minutes-  shared minutes)  25 60 68 60 55 60 55 55 65 55    ** Medicare-add KX at 5th visit if PT&ST/10h visit if PT only**           Add KX    Therapist's Initials  KR KR AW AW KR AW KR HDB AW KR

## 2018-11-19 ENCOUNTER — Inpatient Hospital Stay: Admit: 2018-11-19 | Payer: PRIVATE HEALTH INSURANCE | Primary: Family Medicine

## 2018-11-19 NOTE — Progress Notes (Signed)
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]   Referral Source: Joycie Peek., MD     Date:11/19/2018  Visit #: 22     Treatment Area: L TKR  Date of Surgery: 09/08/18  Protocol  []  Yes  Precautions/Restrictions:  Patients prefers to be treated behind curtain when appropriate:  []  Yes   Patient has therapist preference:   []  Male         []  Male         SUBJECTIVE  Pain Level (0-10 scale):               Pain Level (0-10 scale) post treatment:   Medication Changes: []  No    []  Yes (see paper medication log)  Subjective functional status/changes:   []  No changes reported    Patient notes last spasms but more "soreness" due to using it a lot more.     OBJECTIVE    []  Patient is post-surgical and on a strict surgical protocol that must be followed per MD instructions and to benefit the patient's successful recovery, so treatment started today    Modality: [x]  see flow sheet      Therapeutic Activities: [x]  see flow sheet     []  Other:_  Manual Therapy:  []  see flow sheet     []  Other:_    Patient Education: [x]  Review HEP    []  Progressed/Changed HEP  []  positioning   []  posture/body mechanics   []  transfers   []  heat/ice application    Other Objective/Functional Measures:  []  see evaluation/re-evaluation/progress         ASSESSMENT  []   See evaluation/progress note/reevaluation    Patient with decreased session secondary to MD appointment and RB in use.      Short Term Goals: To be accomplished in 3 weeks:  1)      The patient will demonstrate independence with HEP.  2)      The patient will ambulate with proper mechanics during stance phase of gait with full terminal hip/knee extension and 4/5 strength >200 ft.  Long Term Goals: To be accomplished in 6 weeks:   1)      The patient will be able to return to all household and full work  activities without pain as demonstrated by LEFS>60.  2)      The patient will ascend/descend 1 flight of stairs with reciprocal pattern secondary to full LE ROM and 5/5 strength.  3)      The patient will be able to stand for 60 minutes without pain secondary to strength 5/5 in order to perform woodworking.   []   Patient will continue to benefit from skilled therapy to address remaining goals     PLAN  [x]   Upgrade activities as tolerated      [x]  Frequency/duration: 3x/week for 6 weeks  POC ends (date): 12/01/18  [x]   Continue plan of care    []   Discharge due to:_         Lanetta Inch, PT  11/19/2018  Lower Extremity Therapy Flow Sheet  Ennis Heavner     Classify Intervention   78 29           Date:  11/17/2018 11/19/18           Non timed procedure              Evaluation/re-eval/  D/C              Moist heat/cold pack   MH/CP MH x10' supine MHx10'           Electrical Stimulation   ES             Timed procedure                STM/MFR MT Quad and hamstring STM x           Joint mobilization MT patella inf/sup grade 4 x 10 each x           Manual stretching/PassiveROM MT X ext in supine Supine/sit  /prone           Ultrasound [place]             Exercise/Activity              Quad set TE 20 x 10" x           Heel slides TE X 20 with strap, foot on ball x           UBKFO TE Black x 20 BLK  x20ea           Bridges with ball TE X 20  x20           SLR TE 1.5# X 15 1.5# x20           LAQ TE 2.5# x 20 2.5# x20           Prone knee hang TE 2' x           Quadriped knee flex stretch TE 10 x 5" 2/30sec"           Prone ham curls TE X 20 towel under thigh x20ea           Strap Hamstring/Quad stretch TE Supine ham/  Prone quad 2/30sec x           Stair Stretches TE  flex, ext 10 x 10" each x           Standing alternating-hip abd, hip ext TA Red x 20 each GTB  x20ea           Minisquats/HR TA X 20 each  HR on 1/2 roll x            TKE TA Black 20 x 5" Silver  20/5"           Sidestepping TA p            Ascending/descending stairs reciprocally TA             Forward step ups/Lateral Step ups TA             Forward step downs>dips TA 8" x 20 FSD 8"  x20           Cable column/Totalgym TE             LegPress TE             Recumbent bike/elliptical TE Seat 9 x 10' NT  In use  Gait training GT             HEP updated              Gait training 81191 GT              Moist Heat/Cold Pack 97010   EStim 97014     MH/CP    ES 10 10           Manual Therapy 97140 MT 15 10           Ultrasound 97035  [place]             Therapeutic exercise 97110 TE 35 25           Therapeutic Activity 97530 TA 15 15           Neuromuscular Reeducation 97112  NMR             TOTAL TREATMENT TIME  (untimed+timed minutes)  95 60           TIMED CODE TREATMENT  (timed code minutes-  shared minutes)  65 50           ** Medicare-add KX at 5th visit if PT&ST/10h visit if PT only**           Add KX    Therapist's Initials  AW HDB                                    Lower Extremity Therapy Flow Sheet  Prathik Aman     Classify Intervention   Date:  10/25/2018 10/27/2018 10/29/18 11/01/17 11/03/18 11/05/18 11/08/18 11/10/2018 11/12/18 11/15/18   Non timed procedure              Evaluation/re-eval/  D/C      RE        Moist heat/cold pack   MH/CP MH x10' supine x x x  x MH x10 x x x   Electrical Stimulation   ES             Timed procedure                STM/MFR MT Post knee in straight  incision and quad in flex x x Distal quad/incision in full flex   Distal quad  In full flex supine Quad and hamstring STM Ham/quad in flex x   Joint mobilization MT p patella X inf/sup x    Mulligan belt seated distraction with flex  Manual distraction Mulligan  seated flex/distract Mulligan seated distract   Manual stretching/PassiveROM MT X ext in supine, flex in supine and seated x 10 each x x Ext/flex   Both supine/prone  x x Flex in seated ext in supine All 3 positions Supine/prone   Ultrasound [place]             Exercise/Activity              Quad set TE 20 x 10" x Heel on foam Foam  20/10"  x x x x 1/2 foam heel  20/5"   Heel slides TE X 20 with strap, foot on ball X no ball 10x10" Ball/strap  X10/10"  x x x 20/5" x   UBKFO TE Black x 20 x  BLK  tband  x20ea   BLK x20ea x BLK  x20ea x  Bridges/ball TE         Bridges w/ ball  x10 x20   SLR TE X 10 x  2x10   1.5# x10  x 1.5#x15   SAQ/LAQ TE SAQ x 2 p  LAQ x 20 X 20 LAQ  1.5# both x10ea  Focus quad set  2# ea 2.5# LAQ   x10 2 x 10 2.5# LAQ  x20 2.5#x20   Prone knee hang TE 2' x  x2'   x2' x x x   Quadriped knee flex stretch TE p      10/5" x x x   Prone ham curls TE X 20 towel under thigh x  x20   x10A  x10AA x x10 A  x10 AA x20 A   Strap Hamstring/Quad stretch TE 3 x 30" each x  x   2/30sec x Supine ham/  Prone quad 2/30sec x   Stair Stretches TE  flex, ext 10 x 10" each x xx xx xx xx xx  xx xx   Standing alternating-hip abd, hip ext TA Red x 20 each x           Minisquats/HR TA X 20 each x  x20ea     Calf raises on 1/2 roll  x20 1/2 roll x20   TKE TA   Green 10x10" BTB  20/10" x x BTB  20/10" x BLK  20/10" BLK x20   Sidestepping TA p            Ascending/descending stairs reciprocally TA             Forward step ups/Lateral Step ups TA 8" x 20 each x  Both  x20 ea         Forward step downs>dips TA 4" x 20 x  x   6" FSD  x10  6" FSD  X 20 8" FSD  x10   Cable column/Totalgym TE             LegPress TE             Recumbent bike/elliptical TE Seat 11 x 10 x Seat 9 for flex Seat 10  x10' Seat 9 for flex X EOS x10 EOS  Seat 9  M5 X rec elliptical seat 7 RB seat 9  x10 RB 9 x10'   Gait training GT             HEP updated              Gait training [id number] GT              Moist Heat/Cold Pack 97010   EStim 97014     MH/CP    ES 10 10 10 10  10 10 10 10 10    Manual Therapy 97140 MT 15 15 15 15  15 15 15 10 10    Ultrasound 97035  [place]              Therapeutic exercise 97110 TE 35 35 25 30 20 30 30 30 35 35    Therapeutic Activity 97530 TA 15 15 5 10   10 10 10 10    Neuromuscular Reeducation 97112  NMR             TOTAL TREATMENT TIME  (untimed+timed minutes)  95 85 55 65 40 55 65 65 65 65    TIMED CODE TREATMENT  (timed code minutes-  shared minutes)  65 65 45 55 20 45 55 55 55 55    **  Medicare-add KX at 5th visit if PT&ST/10h visit if PT only**           Add KX    Therapist's Initials  AW AW KR HDB KR KR HDB AW HDB HDB             Lower Extremity Therapy Flow Sheet  Ambrocio Sanseverino     Classify Intervention   1 2 3 4 5 6 7 8 9 10    Date:  09/28/18 09/30/18 10/02/2018 10/04/2018 10/11/18 10/13/2018 10/15/18 10/18/18 10/20/2018 10/22/18   Non timed procedure              Evaluation/re-eval/  D/C  IE            Moist heat/cold pack   MH/CP MH x10' supine BOS and EOS  bolster under knee BOS xx xx X    Declined EOS x x x x declined   Electrical Stimulation   ES             Timed procedure                STM/MFR MT p scar (once steri strips off), retro for swelling Retro knee for swelling X plus quad in sitting x x X plus GT3 x Post knee in straight  incision and quad in flex x x   Joint mobilization MT p patella x x x x x x x     Manual stretching/PassiveROM MT p flex, ext x X flex in sitting with tibial IR and distraction x x X plus ext in supine x 10 each x Ext/flex in supine and prone X ext in supine, flex in supine and seated x 10 each xx   Ultrasound [place]             Exercise/Activity              Quad set TE 20x5" x x x x x x x  20x10" on 1/2 foam   Heel slides TE x20 with SOS x10 x X 20  x x x Blue pball under heel  With strap  20/5" x With SOS 15x10"   Hip add ball/ Hip abd Tband TE p Ball squeeze 20x5"    UBKF green x20 xx DC ball sq        x UBKF green x20 x x UBKF  BTB  20/5"   X black x   Bridges with ball squeeze TE p   X 20 x x x W/ ball  x20 DC    March>SLR TE p       SLR  10/3" x    SAQ/LAQ TE x20 ea xx xx X LAQ xx xx xx Both  x20ea xx 1# ea    Prone knee hang TE         2'    Sidelying hip abd/clams TE p            Prone ham curls TE p       x20  2 Towel folded thigh x    Strap Hamstring/Quad stretch TE p   2 x 30" HS X   X  Plus prone quad stretch 2 x 30" xx Calf/ham  2/30sec X HS and prone quad 3 x 30" each x   Stair Stretches TE p stair stretches flex, ext  10 x 5" each x 3x20" ea 10 x 5" 3x20" ea x xx 10x10" ea   Standing alternating-  marches, ham curls, hip abd, hip  ext TA p        X 20 ext, abd with red band    Minisquats/HR TA p   X 20  HR x20 x  x Xx plus mini squats    Tandem>SLS NMR p            Wobbleboard/BAPS NMR             TKE TA p       GTB  10/10"  p   Sidestepping TA p            Ascending/descending stairs reciprocally TA   4" x 5          Forward step ups/Lateral Step ups TA p  FSU 6" x 10   FSU 6" x 10  LSU 6" x 10 X 20 x20 ea 8"  xx Both 8"  x20ea xx    Forward step downs>dips TA p     4" x 20 x FSD 4" x20 x    Cable column/Totalgym TE             LegPress TE             Recumbent bike/elliptical TE p RB seat 12 rocking x8' X seat 11 x Full rev x10' x x M2 seat 11  x10 x BOS   Treadmill/Elliptical TE                           Gait training GT             HEP updated  As above; printout provided    Educated on swelling and elevation            Gait training 16109 GT              Moist Heat/Cold Pack 97010   EStim 97014     MH/CP    ES  Manual Therapy 97140 MT  Ultrasound 97035  [place]             Therapeutic exercise 97110 TE   Therapeutic Activity 97530 TA   Neuromuscular Reeducation 97112  NMR             TOTAL TREATMENT TIME  (untimed+timed minutes)    TIMED CODE TREATMENT  (timed code minutes-  shared minutes)    ** Medicare-add KX at 5th visit if PT&ST/10h visit if PT only**           Add KX    Therapist's Initials  KR KR AW AW KR AW KR HDB AW KR

## 2018-11-19 NOTE — Progress Notes (Signed)
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]   Referral Source: Joel Graham., MD     Date:11/19/2018  Visit #: 22     Treatment Area: L TKR  Date of Surgery: 09/08/18  Protocol  []  Yes  Precautions/Restrictions:  Patients prefers to be treated behind curtain when appropriate:  []  Yes   Patient has therapist preference:   []  Male         []  Male         SUBJECTIVE  Pain Level (0-10 scale):               Pain Level (0-10 scale) post treatment:   Medication Changes: []  No    []  Yes (see paper medication log)  Subjective functional status/changes:   []  No changes reported    Patient notes last spasms but more "soreness" due to using it a lot more.     OBJECTIVE    []  Patient is post-surgical and on a strict surgical protocol that must be followed per MD instructions and to benefit the patient's successful recovery, so treatment started today    Modality: [x]  see flow sheet      Therapeutic Activities: [x]  see flow sheet     []  Other:_  Manual Therapy:  []  see flow sheet     []  Other:_    Patient Education: [x]  Review HEP    []  Progressed/Changed HEP  []  positioning   []  posture/body mechanics   []  transfers   []  heat/ice application    Other Objective/Functional Measures:  []  see evaluation/re-evaluation/progress         ASSESSMENT  []   See evaluation/progress note/reevaluation    Patient with decreased session secondary to MD appointment and RB in use.      Short Term Goals: To be accomplished in 3 weeks:  1)      The patient will demonstrate independence with HEP.  2)      The patient will ambulate with proper mechanics during stance phase of gait with full terminal hip/knee extension and 4/5 strength >200 ft.  Long Term Goals: To be accomplished in 6 weeks:   1)      The patient will be able to return to all household and full work activities  without pain as demonstrated by LEFS>60.  2)      The patient will ascend/descend 1 flight of stairs with reciprocal pattern secondary to full LE ROM and 5/5 strength.  3)      The patient will be able to stand for 60 minutes without pain secondary to strength 5/5 in order to perform woodworking.   []   Patient will continue to benefit from skilled therapy to address remaining goals     PLAN  [x]   Upgrade activities as tolerated      [x]  Frequency/duration: 3x/week for 6 weeks  POC ends (date): 12/01/18  [x]   Continue plan of care    []   Discharge due to:_         Lanetta Inch, PT  11/19/2018  Lower Extremity Therapy Flow Sheet  Joel Graham     Classify Intervention   08 65           Date:  11/17/2018 11/19/18           Non timed procedure              Evaluation/re-eval/  D/C              Moist heat/cold pack   MH/CP MH x10' supine MHx10'           Electrical Stimulation   ES             Timed procedure                STM/MFR MT Quad and hamstring STM x           Joint mobilization MT patella inf/sup grade 4 x 10 each x           Manual stretching/PassiveROM MT X ext in supine Supine/sit  /prone           Ultrasound [place]             Exercise/Activity              Quad set TE 20 x 10" x           Heel slides TE X 20 with strap, foot on ball x           UBKFO TE Black x 20 BLK  x20ea           Bridges with ball TE X 20  x20           SLR TE 1.5# X 15 1.5# x20           LAQ TE 2.5# x 20 2.5# x20           Prone knee hang TE 2' x           Quadriped knee flex stretch TE 10 x 5" 2/30sec"           Prone ham curls TE X 20 towel under thigh x20ea           Strap Hamstring/Quad stretch TE Supine ham/  Prone quad 2/30sec x           Stair Stretches TE  flex, ext 10 x 10" each x           Standing alternating-hip abd, hip ext TA Red x 20 each GTB  x20ea           Minisquats/HR TA X 20 each  HR on 1/2 roll x           TKE TA Black 20 x 5"  Silver  20/5"           Sidestepping TA p            Ascending/descending stairs reciprocally TA             Forward step ups/Lateral Step ups TA             Forward step downs>dips TA 8" x 20 FSD 8"  x20           Cable column/Totalgym TE             LegPress TE             Recumbent bike/elliptical TE Seat 9 x 10' NT  In use  Gait training GT             HEP updated              Gait training 84696 GT              Moist Heat/Cold Pack 97010   EStim 97014     MH/CP    ES 10 10           Manual Therapy 97140 MT 15 10           Ultrasound 97035  [place]             Therapeutic exercise 97110 TE 35 25           Therapeutic Activity 97530 TA 15 15           Neuromuscular Reeducation 97112  NMR             TOTAL TREATMENT TIME  (untimed+timed minutes)  95 60           TIMED CODE TREATMENT  (timed code minutes-  shared minutes)  65 50           ** Medicare-add KX at 5th visit if PT&ST/10h visit if PT only**           Add KX    Therapist's Initials  AW HDB                                    Lower Extremity Therapy Flow Sheet  Joel Graham     Classify Intervention   11 12 13 14 15 16 17 18 19 20    Date:  10/25/2018 10/27/2018 10/29/18 11/01/17 11/03/18 11/05/18 11/08/18 11/10/2018 11/12/18 11/15/18   Non timed procedure              Evaluation/re-eval/  D/C      RE        Moist heat/cold pack   MH/CP MH x10' supine x x x  x MH x10 x x x   Electrical Stimulation   ES             Timed procedure                STM/MFR MT Post knee in straight  incision and quad in flex x x Distal quad/incision in full flex   Distal quad  In full flex supine Quad and hamstring STM Ham/quad in flex x   Joint mobilization MT p patella X inf/sup x    Mulligan belt seated distraction with flex  Manual distraction Mulligan  seated flex/distract Mulligan seated distract   Manual stretching/PassiveROM MT X ext in supine, flex in supine and seated x 10 each x x Ext/flex  Both supine/prone  x x Flex in seated ext in supine All 3 positions Supine/prone    Ultrasound [place]             Exercise/Activity              Quad set TE 20 x 10" x Heel on foam Foam  20/10"  x x x x 1/2 foam heel  20/5"   Heel slides TE X 20 with strap, foot on ball X no ball 10x10" Ball/strap  X10/10"  x x x 20/5" x   UBKFO TE Black x 20 x  BLK  tband  x20ea   BLK x20ea x BLK  x20ea x  Bridges/ball TE         Bridges w/ ball  x10 x20   SLR TE X 10 x  2x10   1.5# x10  x 1.5#x15   SAQ/LAQ TE SAQ x 2 p  LAQ x 20 X 20 LAQ  1.5# both x10ea  Focus quad set  2# ea 2.5# LAQ   x10 2 x 10 2.5# LAQ  x20 2.5#x20   Prone knee hang TE 2' x  x2'   x2' x x x   Quadriped knee flex stretch TE p      10/5" x x x   Prone ham curls TE X 20 towel under thigh x  x20   x10A  x10AA x x10 A  x10 AA x20 A   Strap Hamstring/Quad stretch TE 3 x 30" each x  x   2/30sec x Supine ham/  Prone quad 2/30sec x   Stair Stretches TE  flex, ext 10 x 10" each x xx xx xx xx xx  xx xx   Standing alternating-hip abd, hip ext TA Red x 20 each x           Minisquats/HR TA X 20 each x  x20ea     Calf raises on 1/2 roll  x20 1/2 roll x20   TKE TA   Green 10x10" BTB  20/10" x x BTB  20/10" x BLK  20/10" BLK x20   Sidestepping TA p            Ascending/descending stairs reciprocally TA             Forward step ups/Lateral Step ups TA 8" x 20 each x  Both  x20 ea         Forward step downs>dips TA 4" x 20 x  x   6" FSD  x10  6" FSD  X 20 8" FSD  x10   Cable column/Totalgym TE             LegPress TE             Recumbent bike/elliptical TE Seat 11 x 10 x Seat 9 for flex Seat 10  x10' Seat 9 for flex X EOS x10 EOS  Seat 9  M5 X rec elliptical seat 7 RB seat 9  x10 RB 9 x10'   Gait training GT             HEP updated              Gait training 41324 GT              Moist Heat/Cold Pack 97010   EStim 97014     MH/CP    ES 10 10 10 10  10 10 10 10 10    Manual Therapy 97140 MT 15 15 15 15  15 15 15 10 10    Ultrasound 97035  [place]             Therapeutic exercise 97110 TE 35 35 25 30 20 30 30 30 35 35    Therapeutic Activity 97530 TA 15 15 5 10   10 10  10 10    Neuromuscular Reeducation 97112  NMR             TOTAL TREATMENT TIME  (untimed+timed minutes)  95 85 55 65 40 55 65 65 65 65    TIMED CODE TREATMENT  (timed code minutes-  shared minutes)  65 65 45 55 20 45 55 55 55 55    **  Medicare-add KX at 5th visit if PT&ST/10h visit if PT only**           Add KX    Therapist's Initials  AW AW KR HDB KR KR HDB AW HDB HDB             Lower Extremity Therapy Flow Sheet  Taavon Truong     Classify Intervention   1 2 3 4 5 6 7 8 9 10    Date:  09/28/18 09/30/18 10/02/2018 10/04/2018 10/11/18 10/13/2018 10/15/18 10/18/18 10/20/2018 10/22/18   Non timed procedure              Evaluation/re-eval/  D/C  IE            Moist heat/cold pack   MH/CP MH x10' supine BOS and EOS  bolster under knee BOS xx xx X    Declined EOS x x x x declined   Electrical Stimulation   ES             Timed procedure                STM/MFR MT p scar (once steri strips off), retro for swelling Retro knee for swelling X plus quad in sitting x x X plus GT3 x Post knee in straight  incision and quad in flex x x   Joint mobilization MT p patella x x x x x x x     Manual stretching/PassiveROM MT p flex, ext x X flex in sitting with tibial IR and distraction x x X plus ext in supine x 10 each x Ext/flex in supine and prone X ext in supine, flex in supine and seated x 10 each xx   Ultrasound [place]             Exercise/Activity              Quad set TE 20x5" x x x x x x x  20x10" on 1/2 foam   Heel slides TE x20 with SOS x10 x X 20  x x x Blue pball under heel  With strap  20/5" x With SOS 15x10"   Hip add ball/ Hip abd Tband TE p Ball squeeze 20x5"    UBKF green x20 xx DC ball sq        x UBKF green x20 x x UBKF  BTB  20/5"   X black x   Bridges with ball squeeze TE p   X 20 x x x W/ ball  x20 DC    March>SLR TE p       SLR  10/3" x    SAQ/LAQ TE x20 ea xx xx X LAQ xx xx xx Both  x20ea xx 1# ea   Prone knee hang TE         2'    Sidelying hip abd/clams TE p            Prone ham curls TE p       x20  2 Towel folded thigh x     Strap Hamstring/Quad stretch TE p   2 x 30" HS X   X  Plus prone quad stretch 2 x 30" xx Calf/ham  2/30sec X HS and prone quad 3 x 30" each x   Stair Stretches TE p stair stretches flex, ext  10 x 5" each x 3x20" ea 10 x 5" 3x20" ea x xx 10x10" ea   Standing alternating-  marches, ham curls, hip abd, hip  ext TA p        X 20 ext, abd with red band    Minisquats/HR TA p   X 20  HR x20 x  x Xx plus mini squats    Tandem>SLS NMR p            Wobbleboard/BAPS NMR             TKE TA p       GTB  10/10"  p   Sidestepping TA p            Ascending/descending stairs reciprocally TA   4" x 5          Forward step ups/Lateral Step ups TA p  FSU 6" x 10   FSU 6" x 10  LSU 6" x 10 X 20 x20 ea 8"  xx Both 8"  x20ea xx    Forward step downs>dips TA p     4" x 20 x FSD 4" x20 x    Cable column/Totalgym TE             LegPress TE             Recumbent bike/elliptical TE p RB seat 12 rocking x8' X seat 11 x Full rev x10' x x M2 seat 11  x10 x BOS   Treadmill/Elliptical TE                           Gait training GT             HEP updated  As above; printout provided    Educated on swelling and elevation            Gait training 16109 GT              Moist Heat/Cold Pack 97010   EStim 97014     MH/CP    ES  20 20 20 10 10 10 10 10     Manual Therapy 97140 MT  20 15 15 15 15 15 10 15 15    Ultrasound 97035  [place]             Therapeutic exercise 97110 TE 25 40 38 30 25 30 30 35 35 40    Therapeutic Activity 97530 TA   15 15 15 15 10 10 15     Neuromuscular Reeducation 97112  NMR             TOTAL TREATMENT TIME  (untimed+timed minutes)  55 80 88 90 65 70 65 65 85 55    TIMED CODE TREATMENT  (timed code minutes-  shared minutes)  25 60 68 60 55 60 55 55 65 55    ** Medicare-add KX at 5th visit if PT&ST/10h visit if PT only**           Add KX    Therapist's Initials  KR KR AW AW KR AW KR HDB AW KR

## 2018-12-01 ENCOUNTER — Inpatient Hospital Stay: Admit: 2018-12-01 | Payer: PRIVATE HEALTH INSURANCE | Primary: Family Medicine

## 2018-12-01 DIAGNOSIS — R262 Difficulty in walking, not elsewhere classified: Secondary | ICD-10-CM

## 2018-12-01 NOTE — Progress Notes (Signed)
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  He goes back to the doctor 3/16.  He gets an EMG at the end of the month.     OBJECTIVE     Patient is post-surgical and on a strict surgical protocol that must be followed per MD instructions and to benefit the patient's successful recovery, so treatment started today    Modality:  see flow sheet      Therapeutic Activities:  see flow sheet      Other:_  Manual Therapy:   see flow sheet      Other:_    Patient Education:  Review HEP     Progressed/Changed HEP   positioning    posture/body mechanics    transfers    heat/ice application    Other Objective/Functional Measures:   see evaluation/re-evaluation/progress         ASSESSMENT    See evaluation/progress note/reevaluation    Patient appeared to experience less spasming during this session than previous sessions.    Short Term Goals: To be accomplished in 3 weeks:  1)      The patient will demonstrate independence with HEP.  2)      The patient will ambulate with proper mechanics during stance phase of gait with full terminal hip/knee extension and 4/5 strength >200 ft.  Long Term Goals: To be accomplished in 6 weeks:    1)      The patient will be able to return to all household and full work activities without pain as demonstrated by LEFS>60.  2)      The patient will ascend/descend 1 flight of stairs with reciprocal pattern secondary to full LE ROM and 5/5 strength.  3)      The patient will be able to stand for 60 minutes without pain secondary to strength 5/5 in order to perform woodworking.     Patient will continue to benefit from skilled therapy to address remaining goals     PLAN    Upgrade activities as tolerated       Frequency/duration: 3x/week for 6 weeks  POC ends (date): 12/01/18    Continue plan of care      Discharge due to:_         [name]  12/01/2018  Lower Extremity Therapy Flow Sheet  Joel Graham     Classify Intervention   21 22 23           Date:  11/17/2018 11/19/18 12/01/18          Non timed procedure              Evaluation/re-eval/  D/C    x          Moist heat/cold pack   MH/CP MH x10' supine MHx10' x          Electrical Stimulation   ES             Timed procedure                STM/MFR MT Quad and hamstring STM x x          Joint mobilization MT patella inf/sup grade 4 x 10 each x x          Manual stretching/PassiveROM MT X ext in supine Supine/sit  /prone X supine          Ultrasound [place]             Exercise/Activity              Quad set TE 20 x 10" x           Heel slides TE X 20 with strap, foot on ball x X strap and slide board          UBKFO TE Black x 20 BLK  x20ea x          Bridges with ball TE X 20  x20 x          SLR TE 1.5# X 15 1.5# x20 x          LAQ TE 2.5# x 20 2.5# x20 x          Prone knee hang TE 2' x x          Quadriped knee flex stretch TE 10 x 5" 2/30sec" x          Prone ham curls TE X 20 towel under thigh x20ea x          Strap Hamstring/Quad stretch TE Supine ham/  Prone quad 2/30sec x x          Stair Stretches TE  flex, ext 10 x 10" each x x           Standing alternating-hip abd, hip ext TA Red x 20 each GTB  x20ea x          Minisquats/HR TA X 20 each  HR on 1/2 roll x           TKE TA Black 20 x 5" Silver  20/5" x          Sidestepping TA p            Ascending/descending stairs reciprocally TA             Forward step ups/Lateral Step ups TA   LSU 8"          Forward step downs>dips TA 8" x 20 FSD 8"  x20 x          Cable column/Totalgym TE             LegPress TE             Recumbent bike/elliptical TE Seat 9 x 10' NT  In use  Gait training GT             HEP updated              Gait training 90300 GT              Moist Heat/Cold Pack 97010   EStim 97014     MH/CP    ES 10 10 10           Manual Therapy 97140 MT 15 10 15           Ultrasound 97035  [place]             Therapeutic exercise 97110 TE 35 25 25          Therapeutic Activity 97530 TA 15 15 15           Neuromuscular Reeducation 97112  NMR             TOTAL TREATMENT TIME  (untimed+timed minutes)  95 60 75          TIMED CODE TREATMENT  (timed code minutes-  shared minutes)  65 50 55          ** Medicare-add KX at 5th visit if PT&ST/10h visit if PT only**           Add KX    Therapist's Initials  AW HDB AW                                   Lower Extremity Therapy Flow Sheet  Joel Graham     Classify Intervention   11 12 13 14 15 16 17 18 19 20    Date:  10/25/2018 10/27/2018 10/29/18 11/01/17 11/03/18 11/05/18 11/08/18 11/10/2018 11/12/18 11/15/18   Non timed procedure              Evaluation/re-eval/  D/C      RE        Moist heat/cold pack   MH/CP MH x10' supine x x x  x MH x10 x x x   Electrical Stimulation   ES             Timed procedure                STM/MFR MT Post knee in straight  incision and quad in flex x x Distal quad/incision in full flex   Distal quad  In full flex supine Quad and hamstring STM Ham/quad in flex x   Joint mobilization MT p patella X inf/sup x    Mulligan belt seated distraction with flex  Manual distraction Mulligan  seated flex/distract Mulligan seated distract    Manual stretching/PassiveROM MT X ext in supine, flex in supine and seated x 10 each x x Ext/flex  Both supine/prone  x x Flex in seated ext in supine All 3 positions Supine/prone   Ultrasound [place]             Exercise/Activity              Quad set TE 20 x 10" x Heel on foam Foam  20/10"  x x x x 1/2 foam heel  20/5"   Heel slides TE X 20 with strap, foot on ball X no ball 10x10" Ball/strap  X10/10"  x x x 20/5" x   UBKFO TE Black x 20 x  BLK  tband  x20ea   BLK x20ea x BLK  x20ea x  Bridges/ball TE         Bridges w/ ball  x10 x20   SLR TE X 10 x  2x10   1.5# x10  x 1.5#x15   SAQ/LAQ TE SAQ x 2 p  LAQ x 20 X 20 LAQ  1.5# both x10ea  Focus quad set  2# ea 2.5# LAQ   x10 2 x 10 2.5# LAQ  x20 2.5#x20   Prone knee hang TE 2' x  x2'   x2' x x x   Quadriped knee flex stretch TE p      10/5" x x x   Prone ham curls TE X 20 towel under thigh x  x20   x10A  x10AA x x10 A  x10 AA x20 A   Strap Hamstring/Quad stretch TE 3 x 30" each x  x   2/30sec x Supine ham/  Prone quad 2/30sec x   Stair Stretches TE  flex, ext 10 x 10" each x xx xx xx xx xx  xx xx   Standing alternating-hip abd, hip ext TA Red x 20 each x           Minisquats/HR TA X 20 each x  x20ea     Calf raises on 1/2 roll  x20 1/2 roll x20   TKE TA   Green 10x10" BTB  20/10" x x BTB  20/10" x BLK  20/10" BLK x20   Sidestepping TA p            Ascending/descending stairs reciprocally TA             Forward step ups/Lateral Step ups TA 8" x 20 each x  Both  x20 ea         Forward step downs>dips TA 4" x 20 x  x   6" FSD  x10  6" FSD  X 20 8" FSD  x10   Cable column/Totalgym TE             LegPress TE             Recumbent bike/elliptical TE Seat 11 x 10 x Seat 9 for flex Seat 10  x10' Seat 9 for flex X EOS x10 EOS  Seat 9  M5 X rec elliptical seat 7 RB seat 9  x10 RB 9 x10'   Gait training GT             HEP updated              Gait training 50518 GT              Moist Heat/Cold Pack 97010   EStim 97014     MH/CP    ES 10 10 10 10  10 10 10 10 10     Manual Therapy 97140 MT 15 15 15 15  15 15 15 10 10    Ultrasound 97035  [place]             Therapeutic exercise 97110 TE 35 35 25 30 20 30 30 30 35 35    Therapeutic Activity 97530 TA 15 15 5 10   10 10 10 10    Neuromuscular Reeducation 97112  NMR             TOTAL TREATMENT TIME  (untimed+timed minutes)  95 85 55 65 40 55 65 65 65 65    TIMED CODE TREATMENT  (timed code minutes-  shared minutes)  65 65 45 55 20 45 55 55 55 55    **  Medicare-add KX at 5th visit if PT&ST/10h visit if PT only**           Add KX    Therapist's Initials  AW AW KR HDB KR KR HDB AW HDB HDB             Lower Extremity Therapy Flow Sheet  Joel Graham     Classify Intervention   Date:  09/28/18 09/30/18 10/02/2018 10/04/2018 10/11/18 10/13/2018 10/15/18 10/18/18 10/20/2018 10/22/18   Non timed procedure              Evaluation/re-eval/  D/C  IE            Moist heat/cold pack   MH/CP MH x10' supine BOS and EOS  bolster under knee BOS xx xx X    Declined EOS x x x x declined   Electrical Stimulation   ES             Timed procedure                STM/MFR MT p scar (once steri strips off), retro for swelling Retro knee for swelling X plus quad in sitting x x X plus GT3 x Post knee in straight  incision and quad in flex x x   Joint mobilization MT p patella x x x x x x x     Manual stretching/PassiveROM MT p flex, ext x X flex in sitting with tibial IR and distraction x x X plus ext in supine x 10 each x Ext/flex in supine and prone X ext in supine, flex in supine and seated x 10 each xx   Ultrasound [place]             Exercise/Activity              Quad set TE 20x5" x x x x x x x  20x10" on 1/2 foam   Heel slides TE x20 with SOS x10 x X 20  x x x Blue pball under heel  With strap  20/5" x With SOS 15x10"   Hip add ball/ Hip abd Tband TE p Ball squeeze 20x5"    UBKF green x20 xx DC ball sq        x UBKF green x20 x x UBKF  BTB  20/5"   X black x   Bridges with ball squeeze TE p   X 20 x x x W/ ball  x20 DC    March>SLR TE p       SLR   10/3" x    SAQ/LAQ TE x20 ea xx xx X LAQ xx xx xx Both  x20ea xx 1# ea   Prone knee hang TE         2'    Sidelying hip abd/clams TE p            Prone ham curls TE p       x20  2 Towel folded thigh x    Strap Hamstring/Quad stretch TE p   2 x 30" HS X   X  Plus prone quad stretch 2 x 30" xx Calf/ham  2/30sec X HS and prone quad 3 x 30" each x   Stair Stretches TE p stair stretches flex, ext  10 x 5" each x 3x20" ea 10 x 5" 3x20" ea x xx 10x10" ea   Standing alternating-  marches, ham curls, hip abd, hip  ext TA p        X 20 ext, abd with red band    Minisquats/HR TA p   X 20  HR x20 x  x Xx plus mini squats    Tandem>SLS NMR p            Wobbleboard/BAPS NMR             TKE TA p       GTB  10/10"  p   Sidestepping TA p            Ascending/descending stairs reciprocally TA   4" x 5          Forward step ups/Lateral Step ups TA p  FSU 6" x 10   FSU 6" x 10  LSU 6" x 10 X 20 x20 ea 8"  xx Both 8"  x20ea xx    Forward step downs>dips TA p     4" x 20 x FSD 4" x20 x    Cable column/Totalgym TE             LegPress TE             Recumbent bike/elliptical TE p RB seat 12 rocking x8' X seat 11 x Full rev x10' x x M2 seat 11  x10 x BOS   Treadmill/Elliptical TE                           Gait training GT             HEP updated  As above; printout provided    Educated on swelling and elevation            Gait training 16109 GT              Moist Heat/Cold Pack 97010   EStim 97014     MH/CP    ES  Manual Therapy 97140 MT  Ultrasound 97035  [place]             Therapeutic exercise 97110 TE   Therapeutic Activity 97530 TA   Neuromuscular Reeducation 97112  NMR             TOTAL TREATMENT TIME  (untimed+timed minutes)    TIMED CODE TREATMENT  (timed code minutes-  shared minutes)    ** Medicare-add KX at 5th visit if PT&ST/10h visit if PT only**           Add KX     Therapist's Initials  KR KR AW AW KR AW KR HDB AW KR

## 2018-12-01 NOTE — Progress Notes (Signed)
'[]' [facility], [place], [place], [place] [phone]   Fax [phone]    '[x]' [facility], [place], [place]  Phone [phone]  Fax [phone]    '[]' [facility], [place], [place]: [phone]  Fax: [phone]  ______________________________________________________________________                    RE-EVALUATION/PROGRESS NOTE [name] PLAN OF CARE FOR                  PHYSICAL/OCCUPATIONAL/SPEECH THERAPY      Joel Graham        Date: 12/01/2018   DOB: 25-Dec-1950  Geryl Rankins., MD  Diagnosis Codes: Difficulty in walking, not elsewhere classified [R26.2]  Pain in left knee [M25.562]  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  The spasms are still painful and he wakes up every time he moves.  During the day they last for hours.  He has difficulty lifting his leg over the tub, getting into the car, and going down stairs.      Assessment: The patient has been compliant with therapy attendance, along with compliant participation with his home exercise program. He has made moderate change in his functional status and his goal progress can be seen below. Certain goals have not be attained secondary to continuation of severe spasms in his LLE which have limited his range of motion and tolerance to some activities in PT.  Patient is being seen by a neurologist for the same.  He reports a recent reduction in the severity of the spasms and demonstrates some improvement in his flexion ROM this reporting period.  Patient would benefit from continued PT to allow progression of ROM to facilitate return to fluid gait pattern and all ADLs.    Goals: Status Initial Eval: Status last Re-eval/ Progress: Current   Status: Goals Met?      1. The patient will demonstrate independence with HEP. initiated Pt performs exercises 1-2x/day performing yes   2. The patient will ambulate with proper mechanics during stance phase of gait with full terminal hip/knee extension and 4/5 strength >200 ft. Antalgic, absent TKE, increased antalgia without AD Uses his cane in the middle of the night if he has to get up; cont mod antalgia and absent TKE (no AD) Progressing - no longer using the cane in the middle of the night progressing   3. The patient will be able to return to all household and full work activities without pain as demonstrated by LEFS>60. LEFS: 9/80; 89% limitation LEFS: 40/80; 50% limitation LEFS: 44/80 progressing   4. The patient will ascend/descend 1 flight of stairs with reciprocal pattern secondary to full LE ROM and 5/5 strength. Step-to pattern on stairs with cane Sometimes recpirocal pattern on stairs up, step-to pattern down Ascends reciprocally, descends step-to pattern "that's pretty rough" progressing   5. The patient will be able to stand for 60 minutes without pain secondary to strength 5/5 in order to perform woodworking.     Unable to stand for a long time Able to stand 45 min without pain "It gets uncomfortable" progressing     Functional Outcome Measures:   LEFS score 9/80; 89% limitation to 40/80;  50% limitation to 44/80  ??  Girth Measurements:??  ?? ????????joint line ??10 cm above jt line ??10 cm below jt line   Left 37.6 cm to 37.2 cm 37.4 cm to 36.8 cm 31.6 cm to 30.9 cm   ??  ROM / Strength  '[]' ????Unable to assess????????????????????????????????????AROM ??????????????????????????????????????????          PROM ??????????????????????????????????              ??Strength (1-5)  ?? ?? Left 11/03/18 Right L 3/11 Left 11/03/18 12/01/18 L 3/11 Left 11/03/18 Right 11/03/18   Hip Flexion ??  ??  ??   ?? '4 5 4 5   ' ?? Abduction ??  ??  ??   ?? '4 4 4 ' 4+   ?? Adduction ??  ??  ??   ?? 4+ 4+ 4+ 5   Knee Flexion 72 100 132 105 75 105 107 ?? 3- 4+** 5 5   ?? Extension -3 -3 0 -3 ??   ?? 3- 4+** 5 5    Ankle Plantarflexion ??  ??  ??   ?? '5 5 5 5   ' ?? Dorsiflexion ??  ??  ??   ?? 4 4+ 5 5   ?? Inversion ??  ??  ??   ?? 4+ '5 5 5   ' ?? Eversion ??  ??  ??   ?? 4+ '5 5 5   ' ??????????????????????????????????????????????????????????????????????????????????'[]' ????all WNLs/WFLs ????????'[]' ????all WNLs/WFLs ????????????'[]' ????all at least 4/5  **within available range. ??    Rhomberg EC:??30 sec    Tandem L: 30 sec, mod sway  Tandem R: 30 sec, min sway    SLS L: 30 sec, mod sway, pain  SLS R: 30 sec, min sway      Recommendations:  The patient will benefit from continued skilled therapy intervention to address the below problem list and updated goals in order to increase functional mobility, activities of daily living and quality of life.  Problem List: pain affecting function, decrease ROM, decrease strength, impaired gait/ balance, decrease ADL/ functional abilitiies, decrease activity tolerance and decrease flexibility/ joint mobility    Updated Patient Goals:   '[x]'  Continue with the above unachieved goals   '[]'  Additional goals:    Plan:  Treatment Plan: Therapeutic exercise, Therapeutic activities, Neuromuscular re-education, Physical agent/modality, Gait/balance training, Manual therapy and Patient education  Frequency / Duration:  '[]'  Continue with original POC                                         '[x]'  Benefit from 4 additional weeks: 12/29/18: decreasing to 2x/week           Golden Circle 12/01/2018 8:48 AM  ______________________________________________________________________  I certify that the above Therapy Services are being furnished while the patient is under my care. I agree with the treatment plan and certify that this therapy is necessary.  '[]'  I have read the above and request that my patient continue as recommended  '[]'  I have read the above report and request that my patient continue therapy with the following changes/special instructions:   '[]'  I have read the above report and request that my patient be discharged from therapy   Physician's Signature:________________________Date:________Time:_________  Please sign and FAX back to appropriate hospital.

## 2018-12-01 NOTE — Progress Notes (Signed)
 [] [facility], [place], [place], Ph [phone]   Fax [phone]    [x] [facility], [place], [place]  Phone [phone]  Fax [phone]    [] [facility], [place], [place]: [phone]  Fax: [phone]  ______________________________________________________________________                    RE-EVALUATION/PROGRESS NOTE Joel Graham OF CARE FOR                  PHYSICAL/OCCUPATIONAL/SPEECH THERAPY      Joel Graham        Date: 12/01/2018   DOB: 08-10-1951  Joel Norleen RAMAN., MD  Diagnosis Codes: Difficulty in walking, not elsewhere classified [R26.2]  Pain in left knee [M25.562]  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  The spasms are still painful and he wakes up every time he moves.  During the day they last for hours.  He has difficulty lifting his leg over the tub, getting into the car, and going down stairs.      Assessment: The patient has been compliant with therapy attendance, along with compliant participation with his home exercise program. He has made moderate change in his functional status and his goal progress can be seen below. Certain goals have not be attained secondary to continuation of severe spasms in his LLE which have limited his range of motion and tolerance to some activities in PT.  Patient is being seen by a neurologist for the same.  He reports a recent reduction in the severity of the spasms and demonstrates some improvement in his flexion ROM this reporting period.  Patient would benefit from continued PT to allow progression of ROM to facilitate return to fluid gait pattern and all ADLs.    Goals: Status Initial Eval: Status last Re-eval/ Progress: Current   Status: Goals Met?     1.  The patient will demonstrate independence with HEP. initiated Pt performs exercises 1-2x/day performing yes   2. The patient will ambulate with proper mechanics during stance phase of gait with full terminal hip/knee extension and 4/5 strength >200 ft. Antalgic, absent TKE, increased antalgia without AD Uses his cane in the middle of the night if he has to get up; cont mod antalgia and absent TKE (no AD) Progressing - no longer using the cane in the middle of the night progressing   3. The patient will be able to return to all household and full work activities without pain as demonstrated by LEFS>60. LEFS: 9/80; 89% limitation LEFS: 40/80; 50% limitation LEFS: 44/80 progressing   4. The patient will ascend/descend 1 flight of stairs with reciprocal pattern secondary to full LE ROM and 5/5 strength. Step-to pattern on stairs with cane Sometimes recpirocal pattern on stairs up, step-to pattern down Ascends reciprocally, descends step-to pattern that's pretty rough progressing   5. The patient will be able to stand for 60 minutes without pain secondary to strength 5/5 in order to perform woodworking.     Unable to stand for a long time Able to stand 45 min without pain It gets uncomfortable progressing     Functional Outcome Measures:   LEFS score 9/80; 89% limitation to 40/80;  50% limitation to 44/80    Girth Measurements:   joint line 10 cm above jt line 10 cm below jt line   Left 37.6 cm to 37.2 cm 37.4 cm to 36.8 cm 31.6 cm to 30.9 cm     ROM / Strength  [] ??Unable to assessAROM           PROM               Strength (1-5)    Left 11/03/18 Right L 3/11 Left 11/03/18 12/01/18 L 3/11 Left 11/03/18 Right 11/03/18   Hip Flexion         4 5 4 5     Abduction         4 4 4  4+    Adduction         4+ 4+ 4+ 5   Knee Flexion 72 100 132 105 75 105 107  3- 4+** 5 5    Extension -3 -3 0 -3     3- 4+** 5 5   Ankle Plantarflexion         5  5 5 5     Dorsiflexion         4 4+ 5 5    Inversion         4+ 5 5 5     Eversion         4+ 5 5 5    [] ??all WNLs/WFLs [] ??all WNLs/WFLs [] ??all at least 4/5  **within available range.     Rhomberg EC:30 sec    Tandem L: 30 sec, mod sway  Tandem R: 30 sec, min sway    SLS L: 30 sec, mod sway, pain  SLS R: 30 sec, min sway      Recommendations:  The patient will benefit from continued skilled therapy intervention to address the below problem list and updated goals in order to increase functional mobility, activities of daily living and quality of life.  Problem List: pain affecting function, decrease ROM, decrease strength, impaired gait/ balance, decrease ADL/ functional abilitiies, decrease activity tolerance and decrease flexibility/ joint mobility    Updated Patient Goals:   [x]  Continue with the above unachieved goals   []  Additional goals:    Graham:  Treatment Graham: Therapeutic exercise, Therapeutic activities, Neuromuscular re-education, Physical agent/modality, Gait/balance training, Manual therapy and Patient education  Frequency / Duration:  []  Continue with original POC                                         [x]  Benefit from 4 additional weeks: 12/29/18: decreasing to 2x/week           [name] 12/01/2018 8:48 AM  ______________________________________________________________________  I certify that the above Therapy Services are being furnished while the patient is under my care. I agree with the treatment Graham and certify that this therapy is necessary.  []  I have read the above and request that my patient continue as recommended  []  I have read the above report and request that my patient continue therapy with the following changes/special instructions:   []  I have read the above report and request that my patient be discharged from therapy  Physician's Signature:________________________Date:________Time:_________  Please sign  and FAX back to appropriate hospital.

## 2018-12-01 NOTE — Progress Notes (Signed)
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]   Referral Source: Joycie Peek., MD     Date:12/01/2018  Visit #: 23     Treatment Area: L TKR  Date of Surgery: 09/08/18  Protocol  []  Yes  Precautions/Restrictions:  Patients prefers to be treated behind curtain when appropriate:  []  Yes   Patient has therapist preference:   []  Male         []  Male         SUBJECTIVE  Pain Level (0-10 scale):               Pain Level (0-10 scale) post treatment:   Medication Changes: []  No    []  Yes (see paper medication log)  Subjective functional status/changes:   []  No changes reported    Patient states yesterday he had no spasms until the evening.  He goes back to the doctor 3/16.  He gets an EMG at the end of the month.     OBJECTIVE    []  Patient is post-surgical and on a strict surgical protocol that must be followed per MD instructions and to benefit the patient's successful recovery, so treatment started today    Modality: [x]  see flow sheet      Therapeutic Activities: [x]  see flow sheet     []  Other:_  Manual Therapy:  []  see flow sheet     []  Other:_    Patient Education: [x]  Review HEP    []  Progressed/Changed HEP  []  positioning   []  posture/body mechanics   []  transfers   []  heat/ice application    Other Objective/Functional Measures:  [x]  see evaluation/re-evaluation/progress         ASSESSMENT  [x]   See evaluation/progress note/reevaluation    Patient appeared to experience less spasming during this session than previous sessions.    Short Term Goals: To be accomplished in 3 weeks:  1)      The patient will demonstrate independence with HEP.  2)      The patient will ambulate with proper mechanics during stance phase of gait with full terminal hip/knee extension and 4/5 strength >200 ft.  Long Term Goals: To be accomplished in 6 weeks:   1)       The patient will be able to return to all household and full work activities without pain as demonstrated by LEFS>60.  2)      The patient will ascend/descend 1 flight of stairs with reciprocal pattern secondary to full LE ROM and 5/5 strength.  3)      The patient will be able to stand for 60 minutes without pain secondary to strength 5/5 in order to perform woodworking.   []   Patient will continue to benefit from skilled therapy to address remaining goals     PLAN  [x]   Upgrade activities as tolerated      [x]  Frequency/duration: 3x/week for 6 weeks  POC ends (date): 12/01/18  [x]   Continue plan of care    []   Discharge due to:_         [name]  12/01/2018  Lower Extremity Therapy Flow Sheet  Joel Graham     Classify Intervention   21 22 23           Date:  11/17/2018 11/19/18 12/01/18          Non timed procedure              Evaluation/re-eval/  D/C    x          Moist heat/cold pack   MH/CP MH x10' supine MHx10' x          Electrical Stimulation   ES             Timed procedure                STM/MFR MT Quad and hamstring STM x x          Joint mobilization MT patella inf/sup grade 4 x 10 each x x          Manual stretching/PassiveROM MT X ext in supine Supine/sit  /prone X supine          Ultrasound [place]             Exercise/Activity              Quad set TE 20 x 10" x           Heel slides TE X 20 with strap, foot on ball x X strap and slide board          UBKFO TE Black x 20 BLK  x20ea x          Bridges with ball TE X 20  x20 x          SLR TE 1.5# X 15 1.5# x20 x          LAQ TE 2.5# x 20 2.5# x20 x          Prone knee hang TE 2' x x          Quadriped knee flex stretch TE 10 x 5" 2/30sec" x          Prone ham curls TE X 20 towel under thigh x20ea x          Strap Hamstring/Quad stretch TE Supine ham/  Prone quad 2/30sec x x          Stair Stretches TE  flex, ext 10 x 10" each x x          Standing alternating-hip abd, hip  ext TA Red x 20 each GTB  x20ea x          Minisquats/HR TA X 20 each  HR on 1/2 roll x           TKE TA Black 20 x 5" Silver  20/5" x          Sidestepping TA p            Ascending/descending stairs reciprocally TA             Forward step ups/Lateral Step ups TA   LSU 8"          Forward step downs>dips TA 8" x 20 FSD 8"  x20 x          Cable column/Totalgym TE             LegPress TE             Recumbent bike/elliptical TE Seat 9 x 10' NT  In use  Gait training GT             HEP updated              Gait training 75643 GT              Moist Heat/Cold Pack 97010   EStim 97014     MH/CP    ES 10 10 10           Manual Therapy 97140 MT 15 10 15           Ultrasound 97035  [place]             Therapeutic exercise 97110 TE 35 25 25          Therapeutic Activity 97530 TA 15 15 15           Neuromuscular Reeducation 97112  NMR             TOTAL TREATMENT TIME  (untimed+timed minutes)  95 60 75          TIMED CODE TREATMENT  (timed code minutes-  shared minutes)  65 50 55          ** Medicare-add KX at 5th visit if PT&ST/10h visit if PT only**           Add KX    Therapist's Initials  AW HDB AW                                   Lower Extremity Therapy Flow Sheet  Joel Graham     Classify Intervention   11 12 13 14 15 16 17 18 19 20    Date:  10/25/2018 10/27/2018 10/29/18 11/01/17 11/03/18 11/05/18 11/08/18 11/10/2018 11/12/18 11/15/18   Non timed procedure              Evaluation/re-eval/  D/C      RE        Moist heat/cold pack   MH/CP MH x10' supine x x x  x MH x10 x x x   Electrical Stimulation   ES             Timed procedure                STM/MFR MT Post knee in straight  incision and quad in flex x x Distal quad/incision in full flex   Distal quad  In full flex supine Quad and hamstring STM Ham/quad in flex x   Joint mobilization MT p patella X inf/sup x    [place]igan belt seated distraction with flex  Manual distraction [place]  seated flex/distract [place] seated distract   Manual stretching/PassiveROM MT X ext in  supine, flex in supine and seated x 10 each x x Ext/flex  Both supine/prone  x x Flex in seated ext in supine All 3 positions Supine/prone   Ultrasound [place]             Exercise/Activity              Quad set TE 20 x 10" x Heel on foam Foam  20/10"  x x x x 1/2 foam heel  20/5"   Heel slides TE X 20 with strap, foot on ball X no ball 10x10" Ball/strap  X10/10"  x x x 20/5" x   UBKFO TE Black x 20 x  BLK  tband  x20ea   BLK x20ea x BLK  x20ea x  Bridges/ball TE         Bridges w/ ball  x10 x20   SLR TE X 10 x  2x10   1.5# x10  x 1.5#x15   SAQ/LAQ TE SAQ x 2 p  LAQ x 20 X 20 LAQ  1.5# both x10ea  Focus quad set  2# ea 2.5# LAQ   x10 2 x 10 2.5# LAQ  x20 2.5#x20   Prone knee hang TE 2' x  x2'   x2' x x x   Quadriped knee flex stretch TE p      10/5" x x x   Prone ham curls TE X 20 towel under thigh x  x20   x10A  x10AA x x10 A  x10 AA x20 A   Strap Hamstring/Quad stretch TE 3 x 30" each x  x   2/30sec x Supine ham/  Prone quad 2/30sec x   Stair Stretches TE  flex, ext 10 x 10" each x xx xx xx xx xx  xx xx   Standing alternating-hip abd, hip ext TA Red x 20 each x           Minisquats/HR TA X 20 each x  x20ea     Calf raises on 1/2 roll  x20 1/2 roll x20   TKE TA   Green 10x10" BTB  20/10" x x BTB  20/10" x BLK  20/10" BLK x20   Sidestepping TA p            Ascending/descending stairs reciprocally TA             Forward step ups/Lateral Step ups TA 8" x 20 each x  Both  x20 ea         Forward step downs>dips TA 4" x 20 x  x   6" FSD  x10  6" FSD  X 20 8" FSD  x10   Cable column/Totalgym TE             LegPress TE             Recumbent bike/elliptical TE Seat 11 x 10 x Seat 9 for flex Seat 10  x10' Seat 9 for flex X EOS x10 EOS  Seat 9  M5 X rec elliptical seat 7 RB seat 9  x10 RB 9 x10'   Gait training GT             HEP updated              Gait training 40981 GT              Moist Heat/Cold Pack 97010   EStim 97014     MH/CP    ES 10 10 10 10  10 10 10 10 10    Manual Therapy 97140 MT 15 15 15 15  15 15 15 10 10     Ultrasound 97035  [place]             Therapeutic exercise 97110 TE 35 35 25 30 20 30 30 30 35 35    Therapeutic Activity 97530 TA 15 15 5 10   10 10 10 10    Neuromuscular Reeducation 97112  NMR             TOTAL TREATMENT TIME  (untimed+timed minutes)  95 85 55 65 40 55 65 65 65 65    TIMED CODE TREATMENT  (timed code minutes-  shared minutes)  65 65 45 55 20 45 55 55 55 55    **  Medicare-add KX at 5th visit if PT&ST/10h visit if PT only**           Add KX    Therapist's Initials  AW AW KR HDB KR KR HDB AW HDB HDB             Lower Extremity Therapy Flow Sheet  Joel Graham     Classify Intervention   1 2 3 4 5 6 7 8 9 10    Date:  09/28/18 09/30/18 10/02/2018 10/04/2018 10/11/18 10/13/2018 10/15/18 10/18/18 10/20/2018 10/22/18   Non timed procedure              Evaluation/re-eval/  D/C  IE            Moist heat/cold pack   MH/CP MH x10' supine BOS and EOS  bolster under knee BOS xx xx X    Declined EOS x x x x declined   Electrical Stimulation   ES             Timed procedure                STM/MFR MT p scar (once steri strips off), retro for swelling Retro knee for swelling X plus quad in sitting x x X plus GT3 x Post knee in straight  incision and quad in flex x x   Joint mobilization MT p patella x x x x x x x     Manual stretching/PassiveROM MT p flex, ext x X flex in sitting with tibial IR and distraction x x X plus ext in supine x 10 each x Ext/flex in supine and prone X ext in supine, flex in supine and seated x 10 each xx   Ultrasound [place]             Exercise/Activity              Quad set TE 20x5" x x x x x x x  20x10" on 1/2 foam   Heel slides TE x20 with SOS x10 x X 20  x x x Blue pball under heel  With strap  20/5" x With SOS 15x10"   Hip add ball/ Hip abd Tband TE p Ball squeeze 20x5"    UBKF green x20 xx DC ball sq        x UBKF green x20 x x UBKF  BTB  20/5"   X black x   Bridges with ball squeeze TE p   X 20 x x x W/ ball  x20 DC    March>SLR TE p       SLR  10/3" x    SAQ/LAQ TE x20 ea xx xx X LAQ xx xx xx  Both  x20ea xx 1# ea   Prone knee hang TE         2'    Sidelying hip abd/clams TE p            Prone ham curls TE p       x20  2 Towel folded thigh x    Strap Hamstring/Quad stretch TE p   2 x 30" HS X   X  Plus prone quad stretch 2 x 30" xx Calf/ham  2/30sec X HS and prone quad 3 x 30" each x   Stair Stretches TE p stair stretches flex, ext  10 x 5" each x 3x20" ea 10 x 5" 3x20" ea x xx 10x10" ea   Standing alternating-  marches, ham curls, hip abd, hip  ext TA p        X 20 ext, abd with red band    Minisquats/HR TA p   X 20  HR x20 x  x Xx plus mini squats    Tandem>SLS NMR p            Wobbleboard/BAPS NMR             TKE TA p       GTB  10/10"  p   Sidestepping TA p            Ascending/descending stairs reciprocally TA   4" x 5          Forward step ups/Lateral Step ups TA p  FSU 6" x 10   FSU 6" x 10  LSU 6" x 10 X 20 x20 ea 8"  xx Both 8"  x20ea xx    Forward step downs>dips TA p     4" x 20 x FSD 4" x20 x    Cable column/Totalgym TE             LegPress TE             Recumbent bike/elliptical TE p RB seat 12 rocking x8' X seat 11 x Full rev x10' x x M2 seat 11  x10 x BOS   Treadmill/Elliptical TE                           Gait training GT             HEP updated  As above; printout provided    Educated on swelling and elevation            Gait training 19147 GT              Moist Heat/Cold Pack 97010   EStim 97014     MH/CP    ES  20 20 20 10 10 10 10 10     Manual Therapy 97140 MT  20 15 15 15 15 15 10 15 15    Ultrasound 97035  [place]             Therapeutic exercise 97110 TE 25 40 38 30 25 30 30 35 35 40    Therapeutic Activity 97530 TA   15 15 15 15 10 10 15     Neuromuscular Reeducation 97112  NMR             TOTAL TREATMENT TIME  (untimed+timed minutes)  55 80 88 90 65 70 65 65 85 55    TIMED CODE TREATMENT  (timed code minutes-  shared minutes)  25 60 68 60 55 60 55 55 65 55    ** Medicare-add KX at 5th visit if PT&ST/10h visit if PT only**           Add KX    Therapist's Initials  KR KR AW AW KR AW KR HDB AW  KR

## 2018-12-03 ENCOUNTER — Inpatient Hospital Stay: Admit: 2018-12-03 | Payer: PRIVATE HEALTH INSURANCE | Primary: Family Medicine

## 2018-12-03 NOTE — Progress Notes (Signed)
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]   Referral Source: Joycie Peek., MD     Date:12/03/2018  Visit #: 24     Treatment Area: L TKR  Date of Surgery: 09/08/18  Protocol  []  Yes  Precautions/Restrictions:  Patients prefers to be treated behind curtain when appropriate:  []  Yes   Patient has therapist preference:   []  Male         []  Male         SUBJECTIVE  Pain Level (0-10 scale):               Pain Level (0-10 scale) post treatment:   Medication Changes: []  No    []  Yes (see paper medication log)  Subjective functional status/changes:   []  No changes reported    Pt reports that "this past week I feel like my knee has been getting better." He reports that he occasionally will experience spasms in his muscles particularly when asleep.     OBJECTIVE    []  Patient is post-surgical and on a strict surgical protocol that must be followed per MD instructions and to benefit the patient's successful recovery, so treatment started today    Modality: [x]  see flow sheet      Therapeutic Activities: [x]  see flow sheet     []  Other:_  Manual Therapy:  []  see flow sheet     []  Other:_    Patient Education: [x]  Review HEP    []  Progressed/Changed HEP  []  positioning   []  posture/body mechanics   []  transfers   []  heat/ice application    Other Objective/Functional Measures:  [x]  see evaluation/re-evaluation/progress         ASSESSMENT  [x]   See evaluation/progress note/reevaluation    Pt reported mild spasm during PROM today however was able to perform all other activities without significant pain or discomfort. States that quadruped knee flex stretch "always leave my knee feeling sore." Recumbent bike at end of session was initiated again this session with good pt tolerance.    Short Term Goals: To be accomplished in 3 weeks:   1)      The patient will demonstrate independence with HEP.  2)      The patient will ambulate with proper mechanics during stance phase of gait with full terminal hip/knee extension and 4/5 strength >200 ft.  Long Term Goals: To be accomplished in 6 weeks:   1)      The patient will be able to return to all household and full work activities without pain as demonstrated by LEFS>60.  2)      The patient will ascend/descend 1 flight of stairs with reciprocal pattern secondary to full LE ROM and 5/5 strength.  3)      The patient will be able to stand for 60 minutes without pain secondary to strength 5/5 in order to perform woodworking.   []   Patient will continue to benefit from skilled therapy to address remaining goals     PLAN  [x]   Upgrade activities as tolerated      [x]  Frequency/duration: 3x/week for 6 weeks  POC ends (date): 12/01/18  [x]   Continue plan of care    []   Discharge due to:_         Joel Graham  12/03/2018  Lower Extremity Therapy Flow Sheet  Joel Graham     Classify Intervention   Date:  11/17/2018 11/19/18 12/01/18 12/03/2018         Non timed procedure              Evaluation/re-eval/  D/C    x          Moist heat/cold pack   MH/CP MH x10' supine MHx10' x x         Electrical Stimulation   ES             Timed procedure                STM/MFR MT Quad and hamstring STM x x x         Joint mobilization MT patella inf/sup grade 4 x 10 each x x x         Manual stretching/PassiveROM MT X ext in supine Supine/sit  /prone X supine x         Ultrasound [place]             Exercise/Activity              Quad set TE 20 x 10" x           Heel slides TE X 20 with strap, foot on ball x X strap and slide board x         UBKFO TE Black x 20 BLK  x20ea x x         Bridges with ball TE X 20  x20 x x         SLR TE 1.5# X 15 1.5# x20 x x         LAQ TE 2.5# x 20 2.5# x20 x x         Prone knee hang TE 2' x x X           Quadriped knee flex stretch TE 10 x 5" 2/30sec" x x         Prone ham curls TE X 20 towel under thigh x20ea x x         Strap Hamstring/Quad stretch TE Supine ham/  Prone quad 2/30sec x x x         Stair Stretches TE  flex, ext 10 x 10" each x x          Standing alternating-hip abd, hip ext TA Red x 20 each GTB  x20ea x x         Minisquats/HR TA X 20 each  HR on 1/2 roll x           TKE TA Black 20 x 5" Silver  20/5" x x         Sidestepping TA p            Ascending/descending stairs reciprocally TA             Forward step ups/Lateral Step ups TA   LSU 8" x20         Forward step downs>dips TA 8" x 20 FSD 8"  x20 x x         Cable column/Totalgym TE             LegPress TE             Recumbent bike/elliptical TE Seat 9 x 10' NT  In use  x         Gait training GT             HEP updated              Gait training 16109 GT              Moist Heat/Cold Pack 97010   EStim 97014     MH/CP    ES Manual Therapy 97140 MT Ultrasound 97035  [place]             Therapeutic exercise 97110 TE 35 25 25 35         Therapeutic Activity 97530 TA Neuromuscular Reeducation 97112  NMR             TOTAL TREATMENT TIME  (untimed+timed minutes)  95 60 75 90         TIMED CODE TREATMENT  (timed code minutes-  shared minutes)  65 50 55 65         ** Medicare-add KX at 5th visit if PT&ST/10h visit if PT only**           Add KX    Therapist's Initials  AW HDB AW DD                                  Lower Extremity Therapy Flow Sheet  Joel Graham     Classify Intervention   Date:  10/25/2018 10/27/2018 10/29/18 11/01/17 11/03/18 11/05/18 11/08/18 11/10/2018 11/12/18 11/15/18   Non timed procedure              Evaluation/re-eval/  D/C      RE        Moist heat/cold pack   MH/CP MH x10' supine x x x  x MH x10 x x x   Electrical Stimulation   ES             Timed procedure                STM/MFR MT Post knee in straight   incision and quad in flex x x Distal quad/incision in full flex   Distal quad  In full flex supine Quad and hamstring STM Ham/quad in flex x   Joint mobilization MT p patella X inf/sup x    Mulligan belt seated distraction with flex  Manual distraction Mulligan  seated flex/distract Mulligan seated distract   Manual stretching/PassiveROM MT X ext in supine, flex in supine and seated x 10 each x x Ext/flex  Both supine/prone  x x Flex in seated ext in supine All 3 positions Supine/prone   Ultrasound [place]             Exercise/Activity              Quad set TE 20 x 10" x Heel on foam Foam  20/10"  x x x x 1/2 foam heel  20/5"   Heel slides TE X 20 with strap, foot on ball X no ball 10x10" Ball/strap  X10/10"  x x x 20/5" x   UBKFO TE Black x 20 x  BLK  tband  x20ea  BLK x20ea x BLK  x20ea x   Bridges/ball TE         Bridges w/ ball  x10 x20   SLR TE X 10 x  2x10   1.5# x10  x 1.5#x15   SAQ/LAQ TE SAQ x 2 p  LAQ x 20 X 20 LAQ  1.5# both x10ea  Focus quad set  2# ea 2.5# LAQ   x10 2 x 10 2.5# LAQ  x20 2.5#x20   Prone knee hang TE 2' x  x2'   x2' x x x   Quadriped knee flex stretch TE p      10/5" x x x   Prone ham curls TE X 20 towel under thigh x  x20   x10A  x10AA x x10 A  x10 AA x20 A   Strap Hamstring/Quad stretch TE 3 x 30" each x  x   2/30sec x Supine ham/  Prone quad 2/30sec x   Stair Stretches TE  flex, ext 10 x 10" each x xx xx xx xx xx  xx xx   Standing alternating-hip abd, hip ext TA Red x 20 each x           Minisquats/HR TA X 20 each x  x20ea     Calf raises on 1/2 roll  x20 1/2 roll x20   TKE TA   Green 10x10" BTB  20/10" x x BTB  20/10" x BLK  20/10" BLK x20   Sidestepping TA p            Ascending/descending stairs reciprocally TA             Forward step ups/Lateral Step ups TA 8" x 20 each x  Both  x20 ea         Forward step downs>dips TA 4" x 20 x  x   6" FSD  x10  6" FSD  X 20 8" FSD  x10   Cable column/Totalgym TE             LegPress TE              Recumbent bike/elliptical TE Seat 11 x 10 x Seat 9 for flex Seat 10  x10' Seat 9 for flex X EOS x10 EOS  Seat 9  M5 X rec elliptical seat 7 RB seat 9  x10 RB 9 x10'   Gait training GT             HEP updated              Gait training [id number] GT              Moist Heat/Cold Pack 97010   EStim 97014     MH/CP    ES 10 10 10 10  10 10 10 10 10    Manual Therapy 97140 MT 15 15 15 15  15 15 15 10 10    Ultrasound 97035  [place]             Therapeutic exercise 97110 TE 35 35 25 30 20 30 30 30 35 35    Therapeutic Activity 97530 TA 15 15 5 10   10 10 10 10    Neuromuscular Reeducation 97112  NMR             TOTAL TREATMENT TIME  (untimed+timed minutes)  95 85 55 65 40 55 65 65 65 65    TIMED CODE TREATMENT  (timed code minutes-  shared minutes)  65 65 45  55 20 45 55 55 55 55   ** Medicare-add KX at 5th visit if PT&ST/10h visit if PT only**           Add KX    Therapist's Initials  AW AW KR HDB KR KR HDB AW HDB HDB             Lower Extremity Therapy Flow Sheet  Joel Graham     Classify Intervention   Date:  09/28/18 09/30/18 10/02/2018 10/04/2018 10/11/18 10/13/2018 10/15/18 10/18/18 10/20/2018 10/22/18   Non timed procedure              Evaluation/re-eval/  D/C  IE            Moist heat/cold pack   MH/CP MH x10' supine BOS and EOS  bolster under knee BOS xx xx X    Declined EOS x x x x declined   Electrical Stimulation   ES             Timed procedure                STM/MFR MT p scar (once steri strips off), retro for swelling Retro knee for swelling X plus quad in sitting x x X plus GT3 x Post knee in straight  incision and quad in flex x x   Joint mobilization MT p patella x x x x x x x     Manual stretching/PassiveROM MT p flex, ext x X flex in sitting with tibial IR and distraction x x X plus ext in supine x 10 each x Ext/flex in supine and prone X ext in supine, flex in supine and seated x 10 each xx   Ultrasound [place]             Exercise/Activity              Quad set TE 20x5" x x x x x x x  20x10" on 1/2 foam    Heel slides TE x20 with SOS x10 x X 20  x x x Blue pball under heel  With strap  20/5" x With SOS 15x10"   Hip add ball/ Hip abd Tband TE p Ball squeeze 20x5"    UBKF green x20 xx DC ball sq        x UBKF green x20 x x UBKF  BTB  20/5"   X black x   Bridges with ball squeeze TE p   X 20 x x x W/ ball  x20 DC    March>SLR TE p       SLR  10/3" x    SAQ/LAQ TE x20 ea xx xx X LAQ xx xx xx Both  x20ea xx 1# ea   Prone knee hang TE         2'    Sidelying hip abd/clams TE p            Prone ham curls TE p       x20  2 Towel folded thigh x    Strap Hamstring/Quad stretch TE p   2 x 30" HS X   X  Plus prone quad stretch 2 x 30" xx Calf/ham  2/30sec X HS and prone quad 3 x 30" each x   Stair Stretches TE p stair stretches flex, ext  10 x 5" each x 3x20" ea 10 x 5" 3x20" ea x xx 10x10" ea  Standing alternating-  marches, ham curls, hip abd, hip ext TA p        X 20 ext, abd with red band    Minisquats/HR TA p   X 20  HR x20 x  x Xx plus mini squats    Tandem>SLS NMR p            Wobbleboard/BAPS NMR             TKE TA p       GTB  10/10"  p   Sidestepping TA p            Ascending/descending stairs reciprocally TA   4" x 5          Forward step ups/Lateral Step ups TA p  FSU 6" x 10   FSU 6" x 10  LSU 6" x 10 X 20 x20 ea 8"  xx Both 8"  x20ea xx    Forward step downs>dips TA p     4" x 20 x FSD 4" x20 x    Cable column/Totalgym TE             LegPress TE             Recumbent bike/elliptical TE p RB seat 12 rocking x8' X seat 11 x Full rev x10' x x M2 seat 11  x10 x BOS   Treadmill/Elliptical TE                           Gait training GT             HEP updated  As above; printout provided    Educated on swelling and elevation            Gait training 70110 GT              Moist Heat/Cold Pack 97010   EStim 97014     MH/CP    ES  20 20 20 10 10 10 10 10     Manual Therapy 97140 MT  20 15 15 15 15 15 10 15 15    Ultrasound 97035  [place]             Therapeutic exercise 97110 TE 25 40 38 30 25 30 30 35 35 40     Therapeutic Activity 97530 TA   15 15 15 15 10 10 15     Neuromuscular Reeducation 97112  NMR             TOTAL TREATMENT TIME  (untimed+timed minutes)  55 80 88 90 65 70 65 65 85 55    TIMED CODE TREATMENT  (timed code minutes-  shared minutes)  25 60 68 60 55 60 55 55 65 55    ** Medicare-add KX at 5th visit if PT&ST/10h visit if PT only**           Add KX    Therapist's Initials  KR KR AW AW KR AW KR HDB AW KR

## 2018-12-03 NOTE — Progress Notes (Signed)
 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]   Referral Source: Joel Norleen RAMAN., MD     Date:12/03/2018  Visit #: 24     Treatment Area: L TKR  Date of Surgery: 09/08/18  Protocol  []  Yes  Precautions/Restrictions:  Patients prefers to be treated behind curtain when appropriate:  []  Yes   Patient has therapist preference:   []  Male         []  Male         SUBJECTIVE  Pain Level (0-10 scale):               Pain Level (0-10 scale) post treatment:   Medication Changes: []  No    []  Yes (see paper medication log)  Subjective functional status/changes:   []  No changes reported    Pt reports that this past week I feel like my knee has been getting better. He reports that he occasionally will experience spasms in his muscles particularly when asleep.     OBJECTIVE    []  Patient is post-surgical and on a strict surgical protocol that must be followed per MD instructions and to benefit the patient's successful recovery, so treatment started today    Modality: [x]  see flow sheet      Therapeutic Activities: [x]  see flow sheet     []  Other:_  Manual Therapy:  []  see flow sheet     []  Other:_    Patient Education: [x]  Review HEP    []  Progressed/Changed HEP  []  positioning   []  posture/body mechanics   []  transfers   []  heat/ice application    Other Objective/Functional Measures:  [x]  see evaluation/re-evaluation/progress         ASSESSMENT  [x]   See evaluation/progress note/reevaluation    Pt reported mild spasm during PROM today however was able to perform all other activities without significant pain or discomfort. States that quadruped knee flex stretch always leave my knee feeling sore. Recumbent bike at end of session was initiated again this session with good pt tolerance.    Short Term Goals: To be accomplished in 3 weeks:  1)      The patient  will demonstrate independence with HEP.  2)      The patient will ambulate with proper mechanics during stance phase of gait with full terminal hip/knee extension and 4/5 strength >200 ft.  Long Term Goals: To be accomplished in 6 weeks:   1)      The patient will be able to return to all household and full work activities without pain as demonstrated by LEFS>60.  2)      The patient will ascend/descend 1 flight of stairs with reciprocal pattern secondary to full LE ROM and 5/5 strength.  3)      The patient will be able to stand for 60 minutes without pain secondary to strength 5/5 in order to perform woodworking.   []   Patient will continue to benefit from skilled therapy to address remaining goals     PLAN  [x]   Upgrade activities as tolerated      [x]  Frequency/duration: 3x/week for 6 weeks  POC ends (date): 12/01/18  [x]   Continue plan of care    []   Discharge due to:_         Joel Graham  12/03/2018  Lower Extremity Therapy Flow Sheet  Joel Graham     Classify Intervention   21 22 23 24          Date:  11/17/2018 11/19/18 12/01/18 12/03/2018         Non timed procedure              Evaluation/re-eval/  D/C    x          Moist heat/cold pack   MH/CP MH x10' supine MHx10' x x         Electrical Stimulation   ES             Timed procedure                STM/MFR MT Quad and hamstring STM x x x         Joint mobilization MT patella inf/sup grade 4 x 10 each x x x         Manual stretching/PassiveROM MT X ext in supine Supine/sit  /prone X supine x         Ultrasound US              Exercise/Activity              Quad set TE 20 x 10 x           Heel slides TE X 20 with strap, foot on ball x X strap and slide board x         UBKFO TE Black x 20 BLK  x20ea x x         Bridges with ball TE X 20  x20 x x         SLR TE 1.5# X 15 1.5# x20 x x         LAQ TE 2.5# x 20 2.5# x20 x x         Prone knee hang TE 2' x x X          Quadriped knee flex  stretch TE 10 x 5 2/30sec x x         Prone ham curls TE X 20 towel under thigh x20ea x x         Strap Hamstring/Quad stretch TE Supine ham/  Prone quad 2/30sec x x x         Stair Stretches TE  flex, ext 10 x 10 each x x          Standing alternating-hip abd, hip ext TA Red x 20 each GTB  x20ea x x         Minisquats/HR TA X 20 each  HR on 1/2 roll x           TKE TA Black 20 x 5 Silver  20/5 x x         Sidestepping TA p            Ascending/descending stairs reciprocally TA             Forward step ups/Lateral Step ups TA   LSU 8 x20         Forward step downs>dips TA 8 x 20 FSD 8  x20 x x         Cable column/Totalgym TE             LegPress TE             Recumbent bike/elliptical TE Seat 9 x 10' NT  In use  x         Gait training GT             HEP updated              Gait training 02883 GT              Moist Heat/Cold Pack 97010   EStim 97014     MH/CP    ES 10 10 10 10          Manual Therapy 97140 MT 15 10 15 15          Ultrasound 97035  [place]              Therapeutic exercise 97110 TE 35 25 25 35         Therapeutic Activity 97530 TA 15 15 15 15          Neuromuscular Reeducation 97112  NMR             TOTAL TREATMENT TIME  (untimed+timed minutes)  95 60 75 90         TIMED CODE TREATMENT  (timed code minutes-  shared minutes)  65 50 55 65         ** Medicare-add KX at 5th visit if PT&ST/10h visit if PT only**           Add KX    Therapist's Initials  AW HDB AW DD                                  Lower Extremity Therapy Flow Sheet  Joel Graham     Classify Intervention   11 12 13 14 15 16 17 18 19 20    Date:  10/25/2018 10/27/2018 10/29/18 11/01/17 11/03/18 11/05/18 11/08/18 11/10/2018 11/12/18 11/15/18   Non timed procedure              Evaluation/re-eval/  D/C      RE        Moist heat/cold pack   MH/CP MH x10' supine x x x  x MH x10 x x x   Electrical Stimulation   ES             Timed procedure                STM/MFR MT Post knee in straight  incision and quad in flex x x Distal quad/incision in full flex    Distal quad  In full flex supine Quad and hamstring STM Ham/quad in flex x   Joint mobilization MT p patella X inf/sup x    Mulligan belt seated distraction with flex  Manual distraction Mulligan  seated flex/distract Mulligan seated distract   Manual stretching/PassiveROM MT X ext in supine, flex in supine and seated x 10 each x x Ext/flex  Both supine/prone  x x Flex in seated ext in supine All 3 positions Supine/prone   Ultrasound US              Exercise/Activity              Quad set TE 20 x 10 x Heel on foam Foam  20/10  x x x x 1/2 foam heel  20/5   Heel slides TE X 20 with strap, foot on ball X no ball 10x10 Ball/strap  X10/10  x x x 20/5 x   UBKFO TE Black x 20 x  BLK  tband  x20ea  BLK x20ea x BLK  x20ea x   Bridges/ball TE         Bridges w/ ball  x10 x20   SLR TE X 10 x  2x10   1.5# x10  x 1.5#x15   SAQ/LAQ TE SAQ x 2 p  LAQ x 20 X 20 LAQ  1.5# both x10ea  Focus quad set  2# ea 2.5# LAQ   x10 2 x 10 2.5# LAQ  x20 2.5#x20   Prone knee hang TE 2' x  x2'   x2' x x x   Quadriped knee flex stretch TE p      10/5 x x x   Prone ham curls TE X 20 towel under thigh x  x20   x10A  x10AA x x10 A  x10 AA x20 A   Strap Hamstring/Quad stretch TE 3 x 30 each x  x   2/30sec x Supine ham/  Prone quad 2/30sec x   Stair Stretches TE  flex, ext 10 x 10 each x xx xx xx xx xx  xx xx   Standing alternating-hip abd, hip ext TA Red x 20 each x           Minisquats/HR TA X 20 each x  x20ea     Calf raises on 1/2 roll  x20 1/2 roll x20   TKE TA   Green 10x10 BTB  20/10 x x BTB  20/10 x BLK  20/10 BLK x20   Sidestepping TA p            Ascending/descending stairs reciprocally TA             Forward step ups/Lateral Step ups TA 8 x 20 each x  Both  x20 ea         Forward step downs>dips TA 4 x 20 x  x   6 FSD  x10  6 FSD  X 20 8 FSD  x10   Cable column/Totalgym TE             LegPress TE             Recumbent bike/elliptical TE Seat 11 x 10 x Seat 9 for flex Seat 10  x10' Seat 9 for flex X EOS x10 EOS  Seat 9  M5 X  rec elliptical seat 7 RB seat 9  x10 RB 9 x10'   Gait training GT             HEP updated              Gait training 02883 GT              Moist Heat/Cold Pack 97010   EStim 97014     MH/CP    ES 10 10 10 10  10 10 10 10 10    Manual Therapy 97140 MT 15 15 15 15  15 15 15 10 10    Ultrasound 97035  [place]              Therapeutic exercise 97110 TE 35 35 25 30 20 30 30 30 35 35    Therapeutic Activity 97530 TA 15 15 5 10   10 10 10 10    Neuromuscular Reeducation 97112  NMR             TOTAL TREATMENT TIME  (untimed+timed minutes)  95 85 55 65 40 55 65 65 65 65    TIMED CODE TREATMENT  (timed code minutes-  shared minutes)  65 65 45  55 20 45 55 55 55 55   ** Medicare-add KX at 5th visit if PT&ST/10h visit if PT only**           Add KX    Therapist's Initials  AW AW KR HDB KR KR HDB AW HDB HDB             Lower Extremity Therapy Flow Sheet  Dwan Hemmelgarn     Classify Intervention   1 2 3 4 5 6 7 8 9 10    Date:  09/28/18 09/30/18 10/02/2018 10/04/2018 10/11/18 10/13/2018 10/15/18 10/18/18 10/20/2018 10/22/18   Non timed procedure              Evaluation/re-eval/  D/C  IE            Moist heat/cold pack   MH/CP MH x10' supine BOS and EOS  bolster under knee BOS xx xx X    Declined EOS x x x x declined   Electrical Stimulation   ES             Timed procedure                STM/MFR MT p scar (once steri strips off), retro for swelling Retro knee for swelling X plus quad in sitting x x X plus GT3 x Post knee in straight  incision and quad in flex x x   Joint mobilization MT p patella x x x x x x x     Manual stretching/PassiveROM MT p flex, ext x X flex in sitting with tibial IR and distraction x x X plus ext in supine x 10 each x Ext/flex in supine and prone X ext in supine, flex in supine and seated x 10 each xx   Ultrasound US              Exercise/Activity              Quad set TE 20x5 x x x x x x x  20x10 on 1/2 foam   Heel slides TE x20 with SOS x10 x X 20  x x x Blue pball under heel  With strap  20/5 x With SOS 15x10   Hip add  ball/ Hip abd Tband TE p Ball squeeze 20x5    UBKF green x20 xx DC ball sq        x UBKF green x20 x x UBKF  BTB  20/5   X black x   Bridges with ball squeeze TE p   X 20 x x x W/ ball  x20 DC    March>SLR TE p       SLR  10/3 x    SAQ/LAQ TE x20 ea xx xx X LAQ xx xx xx Both  x20ea xx 1# ea   Prone knee hang TE         2'    Sidelying hip abd/clams TE p            Prone ham curls TE p       x20  2 Towel folded thigh x    Strap Hamstring/Quad stretch TE p   2 x 30 HS X   X  Plus prone quad stretch 2 x 30 xx Calf/ham  2/30sec X HS and prone quad 3 x 30 each x   Stair Stretches TE p stair stretches flex, ext  10 x 5 each x 3x20 ea 10 x 5 3x20 ea x xx 10x10 ea  Standing alternating-  marches, ham curls, hip abd, hip ext TA p        X 20 ext, abd with red band    Minisquats/HR TA p   X 20  HR x20 x  x Xx plus mini squats    Tandem>SLS NMR p            Wobbleboard/BAPS NMR             TKE TA p       GTB  10/10  p   Sidestepping TA p            Ascending/descending stairs reciprocally TA   4 x 5          Forward step ups/Lateral Step ups TA p  FSU 6 x 10   FSU 6 x 10  LSU 6 x 10 X 20 x20 ea 8  xx Both 8  x20ea xx    Forward step downs>dips TA p     4 x 20 x FSD 4 x20 x    Cable column/Totalgym TE             LegPress TE             Recumbent bike/elliptical TE p RB seat 12 rocking x8' X seat 11 x Full rev x10' x x M2 seat 11  x10 x BOS   Treadmill/Elliptical TE                           Gait training GT             HEP updated  As above; printout provided    Educated on swelling and elevation            Gait training 02883 GT              Moist Heat/Cold Pack 97010   EStim 97014     MH/CP    ES  20 20 20 10 10 10 10 10     Manual Therapy 97140 MT  20 15 15 15 15 15 10 15 15    Ultrasound 97035  US              Therapeutic exercise 97110 TE 25 40 38 30 25 30 30 35 35 40    Therapeutic Activity 97530 TA   15 15 15 15 10 10 15     Neuromuscular Reeducation 97112  NMR             TOTAL TREATMENT  TIME  (untimed+timed minutes)  55 80 88 90 65 70 65 65 85 55    TIMED CODE TREATMENT  (timed code minutes-  shared minutes)  25 60 68 60 55 60 55 55 65 55    ** Medicare-add KX at 5th visit if PT&ST/10h visit if PT only**           Add KX    Therapist's Initials  KR KR AW AW KR AW KR HDB AW KR

## 2018-12-07 ENCOUNTER — Inpatient Hospital Stay: Admit: 2018-12-07 | Payer: PRIVATE HEALTH INSURANCE | Primary: Family Medicine

## 2018-12-07 NOTE — Progress Notes (Signed)
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]   Referral Source: Joycie Peek., MD     Date:12/07/2018  Visit #: 25     Treatment Area: L TKR  Date of Surgery: 09/08/18  Protocol   Yes  Precautions/Restrictions:  Patients prefers to be treated behind curtain when appropriate:   Yes   Patient has therapist preference:    Male          Male         SUBJECTIVE  Pain Level (0-10 scale):               Pain Level (0-10 scale) post treatment:   Medication Changes:  No     Yes (see paper medication log)  Subjective functional status/changes:    No changes reported    Pt reports that "I am feeling pretty sore today - I did a lot of work on my deck and around the house I think I probably overdid it." Pt reports that he has been feeling his spasms intermittently, particularly at night but states that they are "not as intense" as they used to be.     OBJECTIVE     Patient is post-surgical and on a strict surgical protocol that must be followed per MD instructions and to benefit the patient's successful recovery, so treatment started today    Modality:  see flow sheet      Therapeutic Activities:  see flow sheet      Other:_  Manual Therapy:   see flow sheet      Other:_    Patient Education:  Review HEP     Progressed/Changed HEP   positioning    posture/body mechanics    transfers    heat/ice application    Other Objective/Functional Measures:   see evaluation/re-evaluation/progress         ASSESSMENT    See evaluation/progress note/reevaluation    Pt with episode of mm spasm during PROM extension today with lasted approx 10-15 seconds before subsiding. Pt was able to tolerate all other exercises well without signficant issue today.     Short Term Goals: To be accomplished in 3 weeks:   1)      The patient will demonstrate independence with HEP.  2)      The patient will ambulate with proper mechanics during stance phase of gait with full terminal hip/knee extension and 4/5 strength >200 ft.  Long Term Goals: To be accomplished in 6 weeks:   1)      The patient will be able to return to all household and full work activities without pain as demonstrated by LEFS>60.  2)      The patient will ascend/descend 1 flight of stairs with reciprocal pattern secondary to full LE ROM and 5/5 strength.  3)      The patient will be able to stand for 60 minutes without pain secondary to strength 5/5 in order to perform woodworking.     Patient will continue to benefit from skilled therapy to address remaining goals     PLAN    Upgrade activities as tolerated       Frequency/duration: 3x/week for 6 weeks  POC ends (date): 12/01/18    Continue plan of care      Discharge due to:_  Aithan Farrelly Earlene Plater  12/07/2018                                                                                 Lower Extremity Therapy Flow Sheet  Kamyar Cammisa     Classify Intervention   21 22 23 24 25 26 27       Date:  11/17/2018 11/19/18 12/01/18 12/03/2018 12/07/2018        Non timed procedure              Evaluation/re-eval/  D/C    x          Moist heat/cold pack   MH/CP MH x10' supine MHx10' x x x        Electrical Stimulation   ES             Timed procedure                STM/MFR MT Quad and hamstring STM x x x x        Joint mobilization MT patella inf/sup grade 4 x 10 each x x x x        Manual stretching/PassiveROM MT X ext in supine Supine/sit  /prone X supine x x        Ultrasound [place]             Exercise/Activity              Quad set TE 20 x 10" x           Heel slides TE X 20 with strap, foot on ball x X strap and slide board x x        UBKFO TE Black x 20 BLK  x20ea x x x        Bridges with ball TE X 20  x20 x x x        SLR TE 1.5# X 15 1.5# x20 x x 2#        LAQ TE 2.5# x 20 2.5# x20 x x 3#         Prone knee hang TE 2' x x X  x        Quadriped knee flex stretch TE 10 x 5" 2/30sec" x x x        Prone ham curls TE X 20 towel under thigh x20ea x x x        Strap Hamstring/Quad stretch TE Supine ham/  Prone quad 2/30sec x x x x        Stair Stretches TE  flex, ext 10 x 10" each x x          Standing alternating-hip abd, hip ext TA Red x 20 each GTB  x20ea x x x        Minisquats/HR TA X 20 each  HR on 1/2 roll x           TKE TA Black 20 x 5" Silver  20/5" x x x        Sidestepping TA p            Ascending/descending stairs reciprocally TA  Forward step ups/Lateral Step ups TA   LSU 8" x20 x        Forward step downs>dips TA 8" x 20 FSD 8"  x20 x x x        Cable column/Totalgym TE             LegPress TE             Recumbent bike/elliptical TE Seat 9 x 10' NT  In use  x x        Gait training GT             HEP updated              Gait training 16109 GT              Moist Heat/Cold Pack 97010   EStim 97014     MH/CP    ES Manual Therapy 97140 MT Ultrasound 97035  [place]             Therapeutic exercise 97110 TE 35 25 25 35 35        Therapeutic Activity 97530 TA Neuromuscular Reeducation 97112  NMR             TOTAL TREATMENT TIME  (untimed+timed minutes)  95 60 75 90 80        TIMED CODE TREATMENT  (timed code minutes-  shared minutes)  65 50 55 65 65        ** Medicare-add KX at 5th visit if PT&ST/10h visit if PT only**           Add KX    Therapist's Initials  AW HDB AW DD DD                                 Lower Extremity Therapy Flow Sheet  Carrson Lightcap     Classify Intervention   Date:  10/25/2018 10/27/2018 10/29/18 11/01/17 11/03/18 11/05/18 11/08/18 11/10/2018 11/12/18 11/15/18   Non timed procedure              Evaluation/re-eval/  D/C      RE        Moist heat/cold pack   MH/CP MH x10' supine x x x  x MH x10 x x x   Electrical Stimulation   ES             Timed procedure                 STM/MFR MT Post knee in straight  incision and quad in flex x x Distal quad/incision in full flex   Distal quad  In full flex supine Quad and hamstring STM Ham/quad in flex x   Joint mobilization MT p patella X inf/sup x    Mulligan belt seated distraction with flex  Manual distraction Mulligan  seated flex/distract Mulligan seated distract   Manual stretching/PassiveROM MT X ext in supine, flex in supine and seated x 10 each x x Ext/flex  Both supine/prone  x x Flex in seated ext in supine All 3 positions Supine/prone   Ultrasound [place]  Exercise/Activity              Quad set TE 20 x 10" x Heel on foam Foam  20/10"  x x x x 1/2 foam heel  20/5"   Heel slides TE X 20 with strap, foot on ball X no ball 10x10" Ball/strap  X10/10"  x x x 20/5" x   UBKFO TE Black x 20 x  BLK  tband  x20ea   BLK x20ea x BLK  x20ea x   Bridges/ball TE         Bridges w/ ball  x10 x20   SLR TE X 10 x  2x10   1.5# x10  x 1.5#x15   SAQ/LAQ TE SAQ x 2 p  LAQ x 20 X 20 LAQ  1.5# both x10ea  Focus quad set  2# ea 2.5# LAQ   x10 2 x 10 2.5# LAQ  x20 2.5#x20   Prone knee hang TE 2' x  x2'   x2' x x x   Quadriped knee flex stretch TE p      10/5" x x x   Prone ham curls TE X 20 towel under thigh x  x20   x10A  x10AA x x10 A  x10 AA x20 A   Strap Hamstring/Quad stretch TE 3 x 30" each x  x   2/30sec x Supine ham/  Prone quad 2/30sec x   Stair Stretches TE  flex, ext 10 x 10" each x xx xx xx xx xx  xx xx   Standing alternating-hip abd, hip ext TA Red x 20 each x           Minisquats/HR TA X 20 each x  x20ea     Calf raises on 1/2 roll  x20 1/2 roll x20   TKE TA   Green 10x10" BTB  20/10" x x BTB  20/10" x BLK  20/10" BLK x20   Sidestepping TA p            Ascending/descending stairs reciprocally TA             Forward step ups/Lateral Step ups TA 8" x 20 each x  Both  x20 ea         Forward step downs>dips TA 4" x 20 x  x   6" FSD  x10  6" FSD  X 20 8" FSD  x10   Cable column/Totalgym TE             LegPress TE              Recumbent bike/elliptical TE Seat 11 x 10 x Seat 9 for flex Seat 10  x10' Seat 9 for flex X EOS x10 EOS  Seat 9  M5 X rec elliptical seat 7 RB seat 9  x10 RB 9 x10'   Gait training GT             HEP updated              Gait training [id number] GT              Moist Heat/Cold Pack 97010   EStim 97014     MH/CP    ES 10 10 10 10  10 10 10 10 10    Manual Therapy 97140 MT 15 15 15 15  15 15 15 10 10    Ultrasound [id number]  US             Therapeutic exercise 97110 TE  35 35 25 30 20 30 30 30 35 35    Therapeutic Activity 97530 TA 15 15 5 10   10 10 10 10    Neuromuscular Reeducation 97112  NMR             TOTAL TREATMENT TIME  (untimed+timed minutes)  95 85 55 65 40 55 65 65 65 65    TIMED CODE TREATMENT  (timed code minutes-  shared minutes)  65 65 45 55 20 45 55 55 55 55    ** Medicare-add KX at 5th visit if PT&ST/10h visit if PT only**           Add KX    Therapist's Initials  AW AW KR HDB KR KR HDB AW HDB HDB             Lower Extremity Therapy Flow Sheet  Thuan Sheldrick     Classify Intervention   1 2 3 4 5 6 7 8 9 10    Date:  09/28/18 09/30/18 10/02/2018 10/04/2018 10/11/18 10/13/2018 10/15/18 10/18/18 10/20/2018 10/22/18   Non timed procedure              Evaluation/re-eval/  D/C  IE            Moist heat/cold pack   MH/CP MH x10' supine BOS and EOS  bolster under knee BOS xx xx X    Declined EOS x x x x declined   Electrical Stimulation   ES             Timed procedure                STM/MFR MT p scar (once steri strips off), retro for swelling Retro knee for swelling X plus quad in sitting x x X plus GT3 x Post knee in straight  incision and quad in flex x x   Joint mobilization MT p patella x x x x x x x     Manual stretching/PassiveROM MT p flex, ext x X flex in sitting with tibial IR and distraction x x X plus ext in supine x 10 each x Ext/flex in supine and prone X ext in supine, flex in supine and seated x 10 each xx   Ultrasound [place]             Exercise/Activity              Quad set TE 20x5" x x x x x x x  20x10" on 1/2 foam    Heel slides TE x20 with SOS x10 x X 20  x x x Blue pball under heel  With strap  20/5" x With SOS 15x10"   Hip add ball/ Hip abd Tband TE p Ball squeeze 20x5"    UBKF green x20 xx DC ball sq        x UBKF green x20 x x UBKF  BTB  20/5"   X black x   Bridges with ball squeeze TE p   X 20 x x x W/ ball  x20 DC    March>SLR TE p       SLR  10/3" x    SAQ/LAQ TE x20 ea xx xx X LAQ xx xx xx Both  x20ea xx 1# ea   Prone knee hang TE         2'    Sidelying hip abd/clams TE p            Prone ham curls TE p  x20  2 Towel folded thigh x    Strap Hamstring/Quad stretch TE p   2 x 30" HS X   X  Plus prone quad stretch 2 x 30" xx Calf/ham  2/30sec X HS and prone quad 3 x 30" each x   Stair Stretches TE p stair stretches flex, ext  10 x 5" each x 3x20" ea 10 x 5" 3x20" ea x xx 10x10" ea   Standing alternating-  marches, ham curls, hip abd, hip ext TA p        X 20 ext, abd with red band    Minisquats/HR TA p   X 20  HR x20 x  x Xx plus mini squats    Tandem>SLS NMR p            Wobbleboard/BAPS NMR             TKE TA p       GTB  10/10"  p   Sidestepping TA p            Ascending/descending stairs reciprocally TA   4" x 5          Forward step ups/Lateral Step ups TA p  FSU 6" x 10   FSU 6" x 10  LSU 6" x 10 X 20 x20 ea 8"  xx Both 8"  x20ea xx    Forward step downs>dips TA p     4" x 20 x FSD 4" x20 x    Cable column/Totalgym TE             LegPress TE             Recumbent bike/elliptical TE p RB seat 12 rocking x8' X seat 11 x Full rev x10' x x M2 seat 11  x10 x BOS   Treadmill/Elliptical TE                           Gait training GT             HEP updated  As above; printout provided    Educated on swelling and elevation            Gait training 16109 GT              Moist Heat/Cold Pack 97010   EStim 97014     MH/CP    ES  Manual Therapy 97140 MT  Ultrasound 97035  [place]             Therapeutic exercise 97110 TE    Therapeutic Activity 97530 TA   Neuromuscular Reeducation 97112  NMR             TOTAL TREATMENT TIME  (untimed+timed minutes)    TIMED CODE TREATMENT  (timed code minutes-  shared minutes)    ** Medicare-add KX at 5th visit if PT&ST/10h visit if PT only**           Add KX    Therapist's Initials  KR KR AW AW KR AW KR HDB AW KR

## 2018-12-07 NOTE — Progress Notes (Signed)
 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]   Referral Source: [name], MD     Date:12/07/2018  Visit #: 25     Treatment Area: L TKR  Date of Surgery: 09/08/18  Protocol  []  Yes  Precautions/Restrictions:  Patients prefers to be treated behind curtain when appropriate:  []  Yes   Patient has therapist preference:   []  Male         []  Male         SUBJECTIVE  Pain Level (0-10 scale):               Pain Level (0-10 scale) post treatment:   Medication Changes: []  No    []  Yes (see paper medication log)  Subjective functional status/changes:   []  No changes reported    Pt reports that I am feeling pretty sore today - I did a lot of work on my deck and around the house I think I probably overdid it. Pt reports that he has been feeling his spasms intermittently, particularly at night but states that they are not as intense as they used to be.     OBJECTIVE    []  Patient is post-surgical and on a strict surgical protocol that must be followed per MD instructions and to benefit the patient's successful recovery, so treatment started today    Modality: [x]  see flow sheet      Therapeutic Activities: [x]  see flow sheet     []  Other:_  Manual Therapy:  []  see flow sheet     []  Other:_    Patient Education: [x]  Review HEP    []  Progressed/Changed HEP  []  positioning   []  posture/body mechanics   []  transfers   []  heat/ice application    Other Objective/Functional Measures:  [x]  see evaluation/re-evaluation/progress         ASSESSMENT  [x]   See evaluation/progress note/reevaluation    Pt with episode of mm spasm during PROM extension today with lasted approx 10-15 seconds before subsiding. Pt was able to tolerate all other exercises well without signficant issue today.     Short Term Goals: To be accomplished in 3 weeks:  1)      The patient  will demonstrate independence with HEP.  2)      The patient will ambulate with proper mechanics during stance phase of gait with full terminal hip/knee extension and 4/5 strength >200 ft.  Long Term Goals: To be accomplished in 6 weeks:   1)      The patient will be able to return to all household and full work activities without pain as demonstrated by LEFS>60.  2)      The patient will ascend/descend 1 flight of stairs with reciprocal pattern secondary to full LE ROM and 5/5 strength.  3)      The patient will be able to stand for 60 minutes without pain secondary to strength 5/5 in order to perform woodworking.   []   Patient will continue to benefit from skilled therapy to address remaining goals     PLAN  [x]   Upgrade activities as tolerated      [x]  Frequency/duration: 3x/week for 6 weeks  POC ends (date): 12/01/18  [x]   Continue plan of care    []   Discharge due to:_  Deirdre Nicholaus  12/07/2018                                                                                 Lower Extremity Therapy Flow Sheet  Kieron Kantner     Classify Intervention   21 22 23 24 25 26 27       Date:  11/17/2018 11/19/18 12/01/18 12/03/2018 12/07/2018        Non timed procedure              Evaluation/re-eval/  D/C    x          Moist heat/cold pack   MH/CP MH x10' supine MHx10' x x x        Electrical Stimulation   ES             Timed procedure                STM/MFR MT Quad and hamstring STM x x x x        Joint mobilization MT patella inf/sup grade 4 x 10 each x x x x        Manual stretching/PassiveROM MT X ext in supine Supine/sit  /prone X supine x x        Ultrasound US              Exercise/Activity              Quad set TE 20 x 10 x           Heel slides TE X 20 with strap, foot on ball x X strap and slide board x x        UBKFO TE Black x 20 BLK  x20ea x x x        Bridges with ball TE X 20  x20 x x x        SLR TE 1.5# X 15 1.5# x20 x x 2#        LAQ TE 2.5# x 20 2.5# x20 x x 3#        Prone knee hang TE 2' x x X  x         Quadriped knee flex stretch TE 10 x 5 2/30sec x x x        Prone ham curls TE X 20 towel under thigh x20ea x x x        Strap Hamstring/Quad stretch TE Supine ham/  Prone quad 2/30sec x x x x        Stair Stretches TE  flex, ext 10 x 10 each x x          Standing alternating-hip abd, hip ext TA Red x 20 each GTB  x20ea x x x        Minisquats/HR TA X 20 each  HR on 1/2 roll x           TKE TA Black 20 x 5 Silver  20/5 x x x        Sidestepping TA p            Ascending/descending stairs reciprocally TA  Forward step ups/Lateral Step ups TA   LSU 8 x20 x        Forward step downs>dips TA 8 x 20 FSD 8  x20 x x x        Cable column/Totalgym TE             LegPress TE             Recumbent bike/elliptical TE Seat 9 x 10' NT  In use  x x        Gait training GT             HEP updated              Gait training 02883 GT              Moist Heat/Cold Pack 97010   EStim 97014     MH/CP    ES 10 10 10 10 10         Manual Therapy 97140 MT 15 10 15 15 15         Ultrasound 97035  [place]              Therapeutic exercise 97110 TE 35 25 25 35 35        Therapeutic Activity 97530 TA 15 15 15 15 15         Neuromuscular Reeducation 97112  NMR             TOTAL TREATMENT TIME  (untimed+timed minutes)  95 60 75 90 80        TIMED CODE TREATMENT  (timed code minutes-  shared minutes)  65 50 55 65 65        ** Medicare-add KX at 5th visit if PT&ST/10h visit if PT only**           Add KX    Therapist's Initials  AW HDB AW DD DD                                 Lower Extremity Therapy Flow Sheet  Manu Rubey     Classify Intervention   11 12 13 14 15 16 17 18 19 20    Date:  10/25/2018 10/27/2018 10/29/18 11/01/17 11/03/18 11/05/18 11/08/18 11/10/2018 11/12/18 11/15/18   Non timed procedure              Evaluation/re-eval/  D/C      RE        Moist heat/cold pack   MH/CP MH x10' supine x x x  x MH x10 x x x   Electrical Stimulation   ES             Timed procedure                STM/MFR MT Post knee in straight  incision and quad  in flex x x Distal quad/incision in full flex   Distal quad  In full flex supine Quad and hamstring STM Ham/quad in flex x   Joint mobilization MT p patella X inf/sup x    Mulligan belt seated distraction with flex  Manual distraction Mulligan  seated flex/distract Mulligan seated distract   Manual stretching/PassiveROM MT X ext in supine, flex in supine and seated x 10 each x x Ext/flex  Both supine/prone  x x Flex in seated ext in supine All 3 positions Supine/prone   Ultrasound US   Exercise/Activity              Quad set TE 20 x 10 x Heel on foam Foam  20/10  x x x x 1/2 foam heel  20/5   Heel slides TE X 20 with strap, foot on ball X no ball 10x10 Ball/strap  X10/10  x x x 20/5 x   UBKFO TE Black x 20 x  BLK  tband  x20ea   BLK x20ea x BLK  x20ea x   Bridges/ball TE         Bridges w/ ball  x10 x20   SLR TE X 10 x  2x10   1.5# x10  x 1.5#x15   SAQ/LAQ TE SAQ x 2 p  LAQ x 20 X 20 LAQ  1.5# both x10ea  Focus quad set  2# ea 2.5# LAQ   x10 2 x 10 2.5# LAQ  x20 2.5#x20   Prone knee hang TE 2' x  x2'   x2' x x x   Quadriped knee flex stretch TE p      10/5 x x x   Prone ham curls TE X 20 towel under thigh x  x20   x10A  x10AA x x10 A  x10 AA x20 A   Strap Hamstring/Quad stretch TE 3 x 30 each x  x   2/30sec x Supine ham/  Prone quad 2/30sec x   Stair Stretches TE  flex, ext 10 x 10 each x xx xx xx xx xx  xx xx   Standing alternating-hip abd, hip ext TA Red x 20 each x           Minisquats/HR TA X 20 each x  x20ea     Calf raises on 1/2 roll  x20 1/2 roll x20   TKE TA   Green 10x10 BTB  20/10 x x BTB  20/10 x BLK  20/10 BLK x20   Sidestepping TA p            Ascending/descending stairs reciprocally TA             Forward step ups/Lateral Step ups TA 8 x 20 each x  Both  x20 ea         Forward step downs>dips TA 4 x 20 x  x   6 FSD  x10  6 FSD  X 20 8 FSD  x10   Cable column/Totalgym TE             LegPress TE             Recumbent bike/elliptical TE Seat 11 x 10 x Seat 9 for flex Seat  10  x10' Seat 9 for flex X EOS x10 EOS  Seat 9  M5 X rec elliptical seat 7 RB seat 9  x10 RB 9 x10'   Gait training GT             HEP updated              Gait training 02883 GT              Moist Heat/Cold Pack 97010   EStim 97014     MH/CP    ES 10 10 10 10  10 10 10 10 10    Manual Therapy 97140 MT 15 15 15 15  15 15 15 10 10    Ultrasound 97035  US              Therapeutic exercise 97110 TE  35 35 25 30 20 30 30 30 35 35    Therapeutic Activity 97530 TA 15 15 5 10   10 10 10 10    Neuromuscular Reeducation 97112  NMR             TOTAL TREATMENT TIME  (untimed+timed minutes)  95 85 55 65 40 55 65 65 65 65    TIMED CODE TREATMENT  (timed code minutes-  shared minutes)  65 65 45 55 20 45 55 55 55 55    ** Medicare-add KX at 5th visit if PT&ST/10h visit if PT only**           Add KX    Therapist's Initials  AW AW KR HDB KR KR HDB AW HDB HDB             Lower Extremity Therapy Flow Sheet  Rinaldo Macqueen     Classify Intervention   1 2 3 4 5 6 7 8 9 10    Date:  09/28/18 09/30/18 10/02/2018 10/04/2018 10/11/18 10/13/2018 10/15/18 10/18/18 10/20/2018 10/22/18   Non timed procedure              Evaluation/re-eval/  D/C  IE            Moist heat/cold pack   MH/CP MH x10' supine BOS and EOS  bolster under knee BOS xx xx X    Declined EOS x x x x declined   Electrical Stimulation   ES             Timed procedure                STM/MFR MT p scar (once steri strips off), retro for swelling Retro knee for swelling X plus quad in sitting x x X plus GT3 x Post knee in straight  incision and quad in flex x x   Joint mobilization MT p patella x x x x x x x     Manual stretching/PassiveROM MT p flex, ext x X flex in sitting with tibial IR and distraction x x X plus ext in supine x 10 each x Ext/flex in supine and prone X ext in supine, flex in supine and seated x 10 each xx   Ultrasound US              Exercise/Activity              Quad set TE 20x5 x x x x x x x  20x10 on 1/2 foam   Heel slides TE x20 with SOS x10 x X 20  x x x Blue pball under  heel  With strap  20/5 x With SOS 15x10   Hip add ball/ Hip abd Tband TE p Ball squeeze 20x5    UBKF green x20 xx DC ball sq        x UBKF green x20 x x UBKF  BTB  20/5   X black x   Bridges with ball squeeze TE p   X 20 x x x W/ ball  x20 DC    March>SLR TE p       SLR  10/3 x    SAQ/LAQ TE x20 ea xx xx X LAQ xx xx xx Both  x20ea xx 1# ea   Prone knee hang TE         2'    Sidelying hip abd/clams TE p            Prone ham curls TE p  x20  2 Towel folded thigh x    Strap Hamstring/Quad stretch TE p   2 x 30 HS X   X  Plus prone quad stretch 2 x 30 xx Calf/ham  2/30sec X HS and prone quad 3 x 30 each x   Stair Stretches TE p stair stretches flex, ext  10 x 5 each x 3x20 ea 10 x 5 3x20 ea x xx 10x10 ea   Standing alternating-  marches, ham curls, hip abd, hip ext TA p        X 20 ext, abd with red band    Minisquats/HR TA p   X 20  HR x20 x  x Xx plus mini squats    Tandem>SLS NMR p            Wobbleboard/BAPS NMR             TKE TA p       GTB  10/10  p   Sidestepping TA p            Ascending/descending stairs reciprocally TA   4 x 5          Forward step ups/Lateral Step ups TA p  FSU 6 x 10   FSU 6 x 10  LSU 6 x 10 X 20 x20 ea 8  xx Both 8  x20ea xx    Forward step downs>dips TA p     4 x 20 x FSD 4 x20 x    Cable column/Totalgym TE             LegPress TE             Recumbent bike/elliptical TE p RB seat 12 rocking x8' X seat 11 x Full rev x10' x x M2 seat 11  x10 x BOS   Treadmill/Elliptical TE                           Gait training GT             HEP updated  As above; printout provided    Educated on swelling and elevation            Gait training 02883 GT              Moist Heat/Cold Pack 97010   EStim 97014     MH/CP    ES  20 20 20 10 10 10 10 10     Manual Therapy 97140 MT  20 15 15 15 15 15 10 15 15    Ultrasound 97035  US              Therapeutic exercise 97110 TE 25 40 38 30 25 30 30 35 35 40    Therapeutic Activity 97530 TA   15 15 15 15 10 10 15     Neuromuscular Reeducation  97112  NMR             TOTAL TREATMENT TIME  (untimed+timed minutes)  55 80 88 90 65 70 65 65 85 55    TIMED CODE TREATMENT  (timed code minutes-  shared minutes)  25 60 68 60 55 60 55 55 65 55    ** Medicare-add KX at 5th visit if PT&ST/10h visit if PT only**           Add KX    Therapist's Initials  KR KR AW AW KR AW KR HDB AW KR

## 2018-12-09 ENCOUNTER — Inpatient Hospital Stay: Admit: 2018-12-09 | Payer: PRIVATE HEALTH INSURANCE | Primary: Family Medicine

## 2018-12-09 NOTE — Progress Notes (Signed)
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]   Referral Source: Joycie Peek., MD     Date:12/09/2018  Visit #: 26     Treatment Area: L TKR  Date of Surgery: 09/08/18  Protocol  []  Yes  Precautions/Restrictions:  Patients prefers to be treated behind curtain when appropriate:  []  Yes   Patient has therapist preference:   []  Male         []  Male         SUBJECTIVE  Pain Level (0-10 scale):               Pain Level (0-10 scale) post treatment:   Medication Changes: []  No    []  Yes (see paper medication log)  Subjective functional status/changes:   []  No changes reported    Pt reports that he was busy around the house and the spasms are about the same as they have been lately.     OBJECTIVE    []  Patient is post-surgical and on a strict surgical protocol that must be followed per MD instructions and to benefit the patient's successful recovery, so treatment started today    Modality: [x]  see flow sheet      Therapeutic Activities: [x]  see flow sheet     []  Other:_  Manual Therapy:  []  see flow sheet     []  Other:_    Patient Education: [x]  Review HEP    []  Progressed/Changed HEP  []  positioning   []  posture/body mechanics   []  transfers   []  heat/ice application    Other Objective/Functional Measures:  [x]  see evaluation/re-evaluation/progress         ASSESSMENT  []   See evaluation/progress note/reevaluation    Pt without significant spasming during visit.  Needs cues to fully contract quad and for dorsiflexion with SLR.    Short Term Goals: To be accomplished in 3 weeks:  1)      The patient will demonstrate independence with HEP.  2)      The patient will ambulate with proper mechanics during stance phase of gait with full terminal hip/knee extension and 4/5 strength >200 ft.  Long Term Goals: To be accomplished in 6 weeks:    1)      The patient will be able to return to all household and full work activities without pain as demonstrated by LEFS>60.  2)      The patient will ascend/descend 1 flight of stairs with reciprocal pattern secondary to full LE ROM and 5/5 strength.  3)      The patient will be able to stand for 60 minutes without pain secondary to strength 5/5 in order to perform woodworking.   []   Patient will continue to benefit from skilled therapy to address remaining goals     PLAN  [x]   Upgrade activities as tolerated      [x]  Frequency/duration: 3x/week for 6 weeks  POC ends (date): 12/01/18  [x]   Continue plan of care    []   Discharge due to:_         [name]  12/09/2018  Lower Extremity Therapy Flow Sheet  Joel Graham     Classify Intervention   21 22 23 24 25 26       5   11/17/2018 11/19/18 12/01/18 12/03/2018 12/07/2018 12/09/2018                         x          Moist heat/cold pack   MH/CP MH x10' supine MHx10' x x x x       Electrical Stimulation   ES             Timed procedure                STM/MFR MT Quad and hamstring STM x x x x x       Joint mobilization MT patella inf/sup grade 4 x 10 each x x x x x       Manual stretching/PassiveROM MT X ext in supine Supine/sit  /prone X supine x x x       Ultrasound [place]             Exercise/Activity              Quad set TE 20 x 10" x           Heel slides TE X 20 with strap, foot on ball x X strap and slide board x x x       UBKFO TE Black x 20 BLK  x20ea x x x x       Bridges with ball TE X 20  x20 x x x x       SLR TE 1.5# X 15 1.5# x20 x x 2# x       LAQ TE 2.5# x 20 2.5# x20 x x 3# x       Prone knee hang TE 2' x x X  x x       Quadriped knee flex stretch TE 10 x 5" 2/30sec" x x x x       Prone ham curls TE X 20 towel under thigh x20ea x x x x       Strap Hamstring/Quad stretch TE Supine ham/  Prone quad 2/30sec x x x x x       Stair Stretches TE  flex, ext 10 x 10" each x x           Standing alternating-hip abd, hip ext TA Red x 20 each GTB  x20ea x x x x       Minisquats/HR TA X 20 each  HR on 1/2 roll x           TKE TA Black 20 x 5" Silver  20/5" x x x x       Sidestepping TA p            Ascending/descending stairs reciprocally TA             Forward step ups/Lateral Step ups TA   LSU 8" x20 x x       Forward step downs>dips TA 8" x 20 FSD 8"  x20 x x x x       Cable column/Totalgym TE             LegPress TE             Recumbent bike/elliptical TE Seat 9 x 10' NT  In use  x x x  Gait training GT             HEP updated              Gait training 16109 GT              Moist Heat/Cold Pack 97010   EStim 97014     MH/CP    ES Manual Therapy 97140 MT Ultrasound [address]35  [place]             Therapeutic exercise 97110 TE 35 25 25 35 35 36       Therapeutic Activity 97530 TA Neuromuscular Reeducation 97112  NMR             TOTAL TREATMENT TIME  (untimed+timed minutes)  95 60 75 90 80 76       TIMED CODE TREATMENT  (timed code minutes-  shared minutes)  65 50 55 65 65 66       ** Medicare-add KX at 5th visit if PT&ST/10h visit if PT only**           Add KX    Therapist's Initials  AW HDB AW DD DD AW                                Lower Extremity Therapy Flow Sheet  Joel Graham     Classify Intervention   Date:  10/25/2018 10/27/2018 10/29/18 11/01/17 11/03/18 11/05/18 11/08/18 11/10/2018 11/12/18 11/15/18   Non timed procedure              Evaluation/re-eval/  D/C      RE        Moist heat/cold pack   MH/CP MH x10' supine x x x  x MH x10 x x x   Electrical Stimulation   ES             Timed procedure                STM/MFR MT Post knee in straight  incision and quad in flex x x Distal quad/incision in full flex   Distal quad  In full flex supine Quad and hamstring STM Ham/quad in flex x   Joint mobilization MT p patella X inf/sup x    Mulligan belt seated distraction with flex  Manual distraction Mulligan   seated flex/distract Mulligan seated distract   Manual stretching/PassiveROM MT X ext in supine, flex in supine and seated x 10 each x x Ext/flex  Both supine/prone  x x Flex in seated ext in supine All 3 positions Supine/prone   Ultrasound [place]             Exercise/Activity              Quad set TE 20 x 10" x Heel on foam Foam  20/10"  x x x x 1/2 foam heel  20/5"   Heel slides TE X 20 with strap, foot on ball X no ball 10x10" Ball/strap  X10/10"  x x x 20/5" x   UBKFO TE Black x 20 x  BLK  tband  x20ea   BLK x20ea x BLK  x20ea x  Bridges/ball TE         Bridges w/ ball  x10 x20   SLR TE X 10 x  2x10   1.5# x10  x 1.5#x15   SAQ/LAQ TE SAQ x 2 p  LAQ x 20 X 20 LAQ  1.5# both x10ea  Focus quad set  2# ea 2.5# LAQ   x10 2 x 10 2.5# LAQ  x20 2.5#x20   Prone knee hang TE 2' x  x2'   x2' x x x   Quadriped knee flex stretch TE p      10/5" x x x   Prone ham curls TE X 20 towel under thigh x  x20   x10A  x10AA x x10 A  x10 AA x20 A   Strap Hamstring/Quad stretch TE 3 x 30" each x  x   2/30sec x Supine ham/  Prone quad 2/30sec x   Stair Stretches TE  flex, ext 10 x 10" each x xx xx xx xx xx  xx xx   Standing alternating-hip abd, hip ext TA Red x 20 each x           Minisquats/HR TA X 20 each x  x20ea     Calf raises on 1/2 roll  x20 1/2 roll x20   TKE TA   Green 10x10" BTB  20/10" x x BTB  20/10" x BLK  20/10" BLK x20   Sidestepping TA p            Ascending/descending stairs reciprocally TA             Forward step ups/Lateral Step ups TA 8" x 20 each x  Both  x20 ea         Forward step downs>dips TA 4" x 20 x  x   6" FSD  x10  6" FSD  X 20 8" FSD  x10   Cable column/Totalgym TE             LegPress TE             Recumbent bike/elliptical TE Seat 11 x 10 x Seat 9 for flex Seat 10  x10' Seat 9 for flex X EOS x10 EOS  Seat 9  M5 X rec elliptical seat 7 RB seat 9  x10 RB 9 x10'   Gait training GT             HEP updated              Gait training 16109 GT              Moist Heat/Cold Pack 97010   EStim 97014     MH/CP     ES Manual Therapy 97140 MT Ultrasound [address]7035  [place]             Therapeutic exercise 97110 TE   Therapeutic Activity 97530 TA Neuromuscular Reeducation 97112  NMR             TOTAL TREATMENT TIME  (untimed+timed minutes)    TIMED CODE TREATMENT  (timed code minutes-  shared minutes)    **  Medicare-add KX at 5th visit if PT&ST/10h visit if PT only**           Add KX    Therapist's Initials  AW AW KR HDB KR KR HDB AW HDB HDB             Lower Extremity Therapy Flow Sheet  Joel Graham     Classify Intervention   1 2 3 4 5 6 7 8 9 10    Date:  09/28/18 09/30/18 10/02/2018 10/04/2018 10/11/18 10/13/2018 10/15/18 10/18/18 10/20/2018 10/22/18   Non timed procedure              Evaluation/re-eval/  D/C  IE            Moist heat/cold pack   MH/CP MH x10' supine BOS and EOS  bolster under knee BOS xx xx X    Declined EOS x x x x declined   Electrical Stimulation   ES             Timed procedure                STM/MFR MT p scar (once steri strips off), retro for swelling Retro knee for swelling X plus quad in sitting x x X plus GT3 x Post knee in straight  incision and quad in flex x x   Joint mobilization MT p patella x x x x x x x     Manual stretching/PassiveROM MT p flex, ext x X flex in sitting with tibial IR and distraction x x X plus ext in supine x 10 each x Ext/flex in supine and prone X ext in supine, flex in supine and seated x 10 each xx   Ultrasound US             Exercise/Activity              Quad set TE 20x5" x x x x x x x  20x10" on 1/2 foam   Heel slides TE x20 with SOS x10 x X 20  x x x Blue pball under heel  With strap  20/5" x With SOS 15x10"   Hip add ball/ Hip abd Tband TE p Ball squeeze 20x5"    UBKF green x20 xx DC ball sq        x UBKF green x20 x x UBKF  BTB  20/5"   X black x   Bridges with ball squeeze TE p   X 20 x x x W/ ball  x20 DC     March>SLR TE p       SLR  10/3" x    SAQ/LAQ TE x20 ea xx xx X LAQ xx xx xx Both  x20ea xx 1# ea   Prone knee hang TE         2'    Sidelying hip abd/clams TE p            Prone ham curls TE p       x20  2 Towel folded thigh x    Strap Hamstring/Quad stretch TE p   2 x 30" HS X   X  Plus prone quad stretch 2 x 30" xx Calf/ham  2/30sec X HS and prone quad 3 x 30" each x   Stair Stretches TE p stair stretches flex, ext  10 x 5" each x 3x20" ea 10 x 5" 3x20" ea x xx 10x10" ea   Standing alternating-  marches, ham curls, hip abd, hip  ext TA p        X 20 ext, abd with red band    Minisquats/HR TA p   X 20  HR x20 x  x Xx plus mini squats    Tandem>SLS NMR p            Wobbleboard/BAPS NMR             TKE TA p       GTB  10/10"  p   Sidestepping TA p            Ascending/descending stairs reciprocally TA   4" x 5          Forward step ups/Lateral Step ups TA p  FSU 6" x 10   FSU 6" x 10  LSU 6" x 10 X 20 x20 ea 8"  xx Both 8"  x20ea xx    Forward step downs>dips TA p     4" x 20 x FSD 4" x20 x    Cable column/Totalgym TE             LegPress TE             Recumbent bike/elliptical TE p RB seat 12 rocking x8' X seat 11 x Full rev x10' x x M2 seat 11  x10 x BOS   Treadmill/Elliptical TE                           Gait training GT             HEP updated  As above; printout provided    Educated on swelling and elevation            Gait training 16109 GT              Moist Heat/Cold Pack 97010   EStim 97014     MH/CP    ES  Manual Therapy 97140 MT  Ultrasound [address]7035  [place]             Therapeutic exercise 97110 TE   Therapeutic Activity 97530 TA   Neuromuscular Reeducation 97112  NMR             TOTAL TREATMENT TIME  (untimed+timed minutes)    TIMED CODE TREATMENT  (timed code minutes-  shared minutes)     ** Medicare-add KX at 5th visit if PT&ST/10h visit if PT only**           Add KX    Therapist's Initials  KR KR AW AW KR AW KR HDB AW KR

## 2018-12-09 NOTE — Progress Notes (Signed)
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]   Referral Source: Joycie Peek., MD     Date:12/09/2018  Visit #: 26     Treatment Area: L TKR  Date of Surgery: 09/08/18  Protocol  []  Yes  Precautions/Restrictions:  Patients prefers to be treated behind curtain when appropriate:  []  Yes   Patient has therapist preference:   []  Male         []  Male         SUBJECTIVE  Pain Level (0-10 scale):               Pain Level (0-10 scale) post treatment:   Medication Changes: []  No    []  Yes (see paper medication log)  Subjective functional status/changes:   []  No changes reported    Pt reports that he was busy around the house and the spasms are about the same as they have been lately.     OBJECTIVE    []  Patient is post-surgical and on a strict surgical protocol that must be followed per MD instructions and to benefit the patient's successful recovery, so treatment started today    Modality: [x]  see flow sheet      Therapeutic Activities: [x]  see flow sheet     []  Other:_  Manual Therapy:  []  see flow sheet     []  Other:_    Patient Education: [x]  Review HEP    []  Progressed/Changed HEP  []  positioning   []  posture/body mechanics   []  transfers   []  heat/ice application    Other Objective/Functional Measures:  [x]  see evaluation/re-evaluation/progress         ASSESSMENT  []   See evaluation/progress note/reevaluation    Pt without significant spasming during visit.  Needs cues to fully contract quad and for dorsiflexion with SLR.    Short Term Goals: To be accomplished in 3 weeks:  1)      The patient will demonstrate independence with HEP.  2)      The patient will ambulate with proper mechanics during stance phase of gait with full terminal hip/knee extension and 4/5 strength >200 ft.  Long Term Goals: To be accomplished in 6 weeks:   1)      The  patient will be able to return to all household and full work activities without pain as demonstrated by LEFS>60.  2)      The patient will ascend/descend 1 flight of stairs with reciprocal pattern secondary to full LE ROM and 5/5 strength.  3)      The patient will be able to stand for 60 minutes without pain secondary to strength 5/5 in order to perform woodworking.   []   Patient will continue to benefit from skilled therapy to address remaining goals     PLAN  [x]   Upgrade activities as tolerated      [x]  Frequency/duration: 3x/week for 6 weeks  POC ends (date): 12/01/18  [x]   Continue plan of care    []   Discharge due to:_         Joel Graham  12/09/2018  Lower Extremity Therapy Flow Sheet  Joel Graham     Classify Intervention   21 22 23 24 25 26       5   11/17/2018 11/19/18 12/01/18 12/03/2018 12/07/2018 12/09/2018                         x          Moist heat/cold pack   MH/CP MH x10' supine MHx10' x x x x       Electrical Stimulation   ES             Timed procedure                STM/MFR MT Quad and hamstring STM x x x x x       Joint mobilization MT patella inf/sup grade 4 x 10 each x x x x x       Manual stretching/PassiveROM MT X ext in supine Supine/sit  /prone X supine x x x       Ultrasound [place]             Exercise/Activity              Quad set TE 20 x 10" x           Heel slides TE X 20 with strap, foot on ball x X strap and slide board x x x       UBKFO TE Black x 20 BLK  x20ea x x x x       Bridges with ball TE X 20  x20 x x x x       SLR TE 1.5# X 15 1.5# x20 x x 2# x       LAQ TE 2.5# x 20 2.5# x20 x x 3# x       Prone knee hang TE 2' x x X  x x       Quadriped knee flex stretch TE 10 x 5" 2/30sec" x x x x       Prone ham curls TE X 20 towel under thigh x20ea x x x x       Strap Hamstring/Quad stretch TE Supine ham/  Prone quad 2/30sec x x x x x       Stair Stretches TE  flex, ext 10 x 10" each x x          Standing  alternating-hip abd, hip ext TA Red x 20 each GTB  x20ea x x x x       Minisquats/HR TA X 20 each  HR on 1/2 roll x           TKE TA Black 20 x 5" Silver  20/5" x x x x       Sidestepping TA p            Ascending/descending stairs reciprocally TA             Forward step ups/Lateral Step ups TA   LSU 8" x20 x x       Forward step downs>dips TA 8" x 20 FSD 8"  x20 x x x x       Cable column/Totalgym TE             LegPress TE             Recumbent bike/elliptical TE Seat 9 x 10' NT  In use  x x x  Gait training GT             HEP updated              Gait training 10960 GT              Moist Heat/Cold Pack 97010   EStim 97014     MH/CP    ES 10 10 10 10 10 10        Manual Therapy 97140 MT 15 10 15 15 15 15        Ultrasound [address]7035  [place]             Therapeutic exercise 97110 TE 35 25 25 35 35 36       Therapeutic Activity 97530 TA 15 15 15 15 15 15        Neuromuscular Reeducation 97112  NMR             TOTAL TREATMENT TIME  (untimed+timed minutes)  95 60 75 90 80 76       TIMED CODE TREATMENT  (timed code minutes-  shared minutes)  65 50 55 65 65 66       ** Medicare-add KX at 5th visit if PT&ST/10h visit if PT only**           Add KX    Therapist's Initials  AW HDB AW DD DD AW                                Lower Extremity Therapy Flow Sheet  Joel Graham     Classify Intervention   11 12 13 14 15 16 17 18 19 20    Date:  10/25/2018 10/27/2018 10/29/18 11/01/17 11/03/18 11/05/18 11/08/18 11/10/2018 11/12/18 11/15/18   Non timed procedure              Evaluation/re-eval/  D/C      RE        Moist heat/cold pack   MH/CP MH x10' supine x x x  x MH x10 x x x   Electrical Stimulation   ES             Timed procedure                STM/MFR MT Post knee in straight  incision and quad in flex x x Distal quad/incision in full flex   Distal quad  In full flex supine Quad and hamstring STM Ham/quad in flex x   Joint mobilization MT p patella X inf/sup x    Mulligan belt seated distraction with flex  Manual distraction Mulligan  seated  flex/distract Mulligan seated distract   Manual stretching/PassiveROM MT X ext in supine, flex in supine and seated x 10 each x x Ext/flex  Both supine/prone  x x Flex in seated ext in supine All 3 positions Supine/prone   Ultrasound [place]             Exercise/Activity              Quad set TE 20 x 10" x Heel on foam Foam  20/10"  x x x x 1/2 foam heel  20/5"   Heel slides TE X 20 with strap, foot on ball X no ball 10x10" Ball/strap  X10/10"  x x x 20/5" x   UBKFO TE Black x 20 x  BLK  tband  x20ea   BLK x20ea x BLK  x20ea x  Bridges/ball TE         Bridges w/ ball  x10 x20   SLR TE X 10 x  2x10   1.5# x10  x 1.5#x15   SAQ/LAQ TE SAQ x 2 p  LAQ x 20 X 20 LAQ  1.5# both x10ea  Focus quad set  2# ea 2.5# LAQ   x10 2 x 10 2.5# LAQ  x20 2.5#x20   Prone knee hang TE 2' x  x2'   x2' x x x   Quadriped knee flex stretch TE p      10/5" x x x   Prone ham curls TE X 20 towel under thigh x  x20   x10A  x10AA x x10 A  x10 AA x20 A   Strap Hamstring/Quad stretch TE 3 x 30" each x  x   2/30sec x Supine ham/  Prone quad 2/30sec x   Stair Stretches TE  flex, ext 10 x 10" each x xx xx xx xx xx  xx xx   Standing alternating-hip abd, hip ext TA Red x 20 each x           Minisquats/HR TA X 20 each x  x20ea     Calf raises on 1/2 roll  x20 1/2 roll x20   TKE TA   Green 10x10" BTB  20/10" x x BTB  20/10" x BLK  20/10" BLK x20   Sidestepping TA p            Ascending/descending stairs reciprocally TA             Forward step ups/Lateral Step ups TA 8" x 20 each x  Both  x20 ea         Forward step downs>dips TA 4" x 20 x  x   6" FSD  x10  6" FSD  X 20 8" FSD  x10   Cable column/Totalgym TE             LegPress TE             Recumbent bike/elliptical TE Seat 11 x 10 x Seat 9 for flex Seat 10  x10' Seat 9 for flex X EOS x10 EOS  Seat 9  M5 X rec elliptical seat 7 RB seat 9  x10 RB 9 x10'   Gait training GT             HEP updated              Gait training 91478 GT              Moist Heat/Cold Pack 97010   EStim 97014     MH/CP    ES 10 10  10 10  10 10 10 10 10    Manual Therapy 97140 MT 15 15 15 15  15 15 15 10 10    Ultrasound [address]7035  [place]             Therapeutic exercise 97110 TE 35 35 25 30 20 30 30 30 35 35    Therapeutic Activity 97530 TA 15 15 5 10   10 10 10 10    Neuromuscular Reeducation 97112  NMR             TOTAL TREATMENT TIME  (untimed+timed minutes)  95 85 55 65 40 55 65 65 65 65    TIMED CODE TREATMENT  (timed code minutes-  shared minutes)  65 65 45 55 20 45 55 55 55 55    **  Medicare-add KX at 5th visit if PT&ST/10h visit if PT only**           Add KX    Therapist's Initials  AW AW KR HDB KR KR HDB AW HDB HDB             Lower Extremity Therapy Flow Sheet  Joel Graham     Classify Intervention   1 2 3 4 5 6 7 8 9 10    Date:  09/28/18 09/30/18 10/02/2018 10/04/2018 10/11/18 10/13/2018 10/15/18 10/18/18 10/20/2018 10/22/18   Non timed procedure              Evaluation/re-eval/  D/C  IE            Moist heat/cold pack   MH/CP MH x10' supine BOS and EOS  bolster under knee BOS xx xx X    Declined EOS x x x x declined   Electrical Stimulation   ES             Timed procedure                STM/MFR MT p scar (once steri strips off), retro for swelling Retro knee for swelling X plus quad in sitting x x X plus GT3 x Post knee in straight  incision and quad in flex x x   Joint mobilization MT p patella x x x x x x x     Manual stretching/PassiveROM MT p flex, ext x X flex in sitting with tibial IR and distraction x x X plus ext in supine x 10 each x Ext/flex in supine and prone X ext in supine, flex in supine and seated x 10 each xx   Ultrasound [place]             Exercise/Activity              Quad set TE 20x5" x x x x x x x  20x10" on 1/2 foam   Heel slides TE x20 with SOS x10 x X 20  x x x Blue pball under heel  With strap  20/5" x With SOS 15x10"   Hip add ball/ Hip abd Tband TE p Ball squeeze 20x5"    UBKF green x20 xx DC ball sq        x UBKF green x20 x x UBKF  BTB  20/5"   X black x   Bridges with ball squeeze TE p   X 20 x x x W/ ball  x20 DC     March>SLR TE p       SLR  10/3" x    SAQ/LAQ TE x20 ea xx xx X LAQ xx xx xx Both  x20ea xx 1# ea   Prone knee hang TE         2'    Sidelying hip abd/clams TE p            Prone ham curls TE p       x20  2 Towel folded thigh x    Strap Hamstring/Quad stretch TE p   2 x 30" HS X   X  Plus prone quad stretch 2 x 30" xx Calf/ham  2/30sec X HS and prone quad 3 x 30" each x   Stair Stretches TE p stair stretches flex, ext  10 x 5" each x 3x20" ea 10 x 5" 3x20" ea x xx 10x10" ea   Standing alternating-  marches, ham curls, hip abd, hip  ext TA p        X 20 ext, abd with red band    Minisquats/HR TA p   X 20  HR x20 x  x Xx plus mini squats    Tandem>SLS NMR p            Wobbleboard/BAPS NMR             TKE TA p       GTB  10/10"  p   Sidestepping TA p            Ascending/descending stairs reciprocally TA   4" x 5          Forward step ups/Lateral Step ups TA p  FSU 6" x 10   FSU 6" x 10  LSU 6" x 10 X 20 x20 ea 8"  xx Both 8"  x20ea xx    Forward step downs>dips TA p     4" x 20 x FSD 4" x20 x    Cable column/Totalgym TE             LegPress TE             Recumbent bike/elliptical TE p RB seat 12 rocking x8' X seat 11 x Full rev x10' x x M2 seat 11  x10 x BOS   Treadmill/Elliptical TE                           Gait training GT             HEP updated  As above; printout provided    Educated on swelling and elevation            Gait training 65784 GT              Moist Heat/Cold Pack 97010   EStim 97014     MH/CP    ES  20 20 20 10 10 10 10 10     Manual Therapy 97140 MT  20 15 15 15 15 15 10 15 15    Ultrasound [address]7035  [place]             Therapeutic exercise 97110 TE 25 40 38 30 25 30 30 35 35 40    Therapeutic Activity 97530 TA   15 15 15 15 10 10 15     Neuromuscular Reeducation 97112  NMR             TOTAL TREATMENT TIME  (untimed+timed minutes)  55 80 88 90 65 70 65 65 85 55    TIMED CODE TREATMENT  (timed code minutes-  shared minutes)  25 60 68 60 55 60 55 55 65 55    ** Medicare-add KX at 5th visit if PT&ST/10h visit if  PT only**           Add KX    Therapist's Initials  KR KR AW AW KR AW KR HDB AW KR

## 2018-12-14 ENCOUNTER — Inpatient Hospital Stay: Admit: 2018-12-14 | Payer: PRIVATE HEALTH INSURANCE | Primary: Family Medicine

## 2018-12-14 NOTE — Progress Notes (Signed)
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]   Referral Source: Joycie Peek., MD     Date:12/14/2018  Visit #: 27     Treatment Area: L TKR  Date of Surgery: 09/08/18  Protocol   Yes  Precautions/Restrictions:  Patients prefers to be treated behind curtain when appropriate:   Yes   Patient has therapist preference:    Male          Male         SUBJECTIVE  Pain Level (0-10 scale):               Pain Level (0-10 scale) post treatment:   Medication Changes:  No     Yes (see paper medication log)  Subjective functional status/changes:    No changes reported    Pt reports that he went up and down the stairs a number of times yesterday so he had more spasms.     OBJECTIVE     Patient is post-surgical and on a strict surgical protocol that must be followed per MD instructions and to benefit the patient's successful recovery, so treatment started today    Modality:  see flow sheet      Therapeutic Activities:  see flow sheet      Other:_  Manual Therapy:   see flow sheet      Other:_    Patient Education:  Review HEP     Progressed/Changed HEP   positioning    posture/body mechanics    transfers    heat/ice application    Other Objective/Functional Measures:   see evaluation/re-evaluation/progress         ASSESSMENT    See evaluation/progress note/reevaluation    Pt without significant spasming during visit.  Knee flexion PROM measured at 107 degrees.  Added resisted adduction with theraband to program.    Short Term Goals: To be accomplished in 3 weeks:  1)      The patient will demonstrate independence with HEP.  2)      The patient will ambulate with proper mechanics during stance phase of gait with full terminal hip/knee extension and 4/5 strength >200 ft.   Long Term Goals: To be accomplished in 6 weeks:   1)      The patient will be able to return to all household and full work activities without pain as demonstrated by LEFS>60.  2)      The patient will ascend/descend 1 flight of stairs with reciprocal pattern secondary to full LE ROM and 5/5 strength.  3)      The patient will be able to stand for 60 minutes without pain secondary to strength 5/5 in order to perform woodworking.     Patient will continue to benefit from skilled therapy to address remaining goals     PLAN    Upgrade activities as tolerated       Frequency/duration: 3x/week for 6 weeks  POC ends (date): 12/01/18    Continue plan of care      Discharge due to:_         [name]  12/14/2018  Lower Extremity Therapy Flow Sheet  Joel Graham     Classify Intervention   21 22 23 24 25 26 27      5   11/17/2018 11/19/18 12/01/18 12/03/2018 12/07/2018 12/09/2018 12/14/2018                        x          Moist heat/cold pack   MH/CP MH x10' supine MHx10' x x x x x      Electrical Stimulation   ES             Timed procedure                STM/MFR MT Quad and hamstring STM x x x x x x      Joint mobilization MT patella inf/sup grade 4 x 10 each x x x x x x      Manual stretching/PassiveROM MT X ext in supine Supine/sit  /prone X supine x x x x      Ultrasound [place]             Exercise/Activity              Quad set TE 20 x 10" x           Heel slides TE X 20 with strap, foot on ball x X strap and slide board x x x x      UBKFO TE Black x 20 BLK  x20ea x x x x x      Bridges with ball TE X 20  x20 x x x x x      SLR TE 1.5# X 15 1.5# x20 x x 2# x x      LAQ TE 2.5# x 20 2.5# x20 x x 3# x       Prone knee hang TE 2' x x X  x x x      Quadriped knee flex stretch TE 10 x 5" 2/30sec" x x x x x      Prone ham curls TE X 20 towel under thigh x20ea x x x x       Strap Hamstring/Quad stretch TE Supine ham/   Prone quad 2/30sec x x x x x x      Stair Stretches TE  flex, ext 10 x 10" each x x    10 x 10"      Standing alternating-hip abd, hip ext TA Red x 20 each GTB  x20ea x x x x X plus adduction      Minisquats/HR TA X 20 each  HR on 1/2 roll x           TKE TA Black 20 x 5" Silver  20/5" x x x x x      Sidestepping TA p            Ascending/descending stairs reciprocally TA             Forward step ups/Lateral Step ups TA   LSU 8" x20 x x x      Forward step downs>dips TA 8" x 20 FSD 8"  x20 x x x x x      Cable column/Totalgym TE             LegPress TE             Recumbent bike/elliptical TE Seat 9 x 10' NT  In use  x x x x      Gait training GT             HEP updated              Gait training 16109 GT              Moist Heat/Cold Pack 97010   EStim 97014     MH/CP    ES Manual Therapy 97140 MT Ultrasound 97035  [place]             Therapeutic exercise 97110 TE 35 25 25 35 35 36 36      Therapeutic Activity 97530 TA Neuromuscular Reeducation 97112  NMR             TOTAL TREATMENT TIME  (untimed+timed minutes)  95 60 75 90 80 76       TIMED CODE TREATMENT  (timed code minutes-  shared minutes)  65 50 55 65 65 66 66      ** Medicare-add KX at 5th visit if PT&ST/10h visit if PT only**           Add KX    Therapist's Initials  AW HDB AW DD DD AW AW                               Lower Extremity Therapy Flow Sheet  Joel Graham     Classify Intervention   Date:  10/25/2018 10/27/2018 10/29/18 11/01/17 11/03/18 11/05/18 11/08/18 11/10/2018 11/12/18 11/15/18   Non timed procedure              Evaluation/re-eval/  D/C      RE        Moist heat/cold pack   MH/CP MH x10' supine x x x  x MH x10 x x x   Electrical Stimulation   ES             Timed procedure                STM/MFR MT Post knee in straight  incision and quad in flex x x Distal quad/incision in full flex   Distal quad   In full flex supine Quad and hamstring STM Ham/quad in flex x   Joint mobilization MT p patella X inf/sup x    Mulligan belt seated distraction with flex  Manual distraction Mulligan  seated flex/distract Mulligan seated distract   Manual stretching/PassiveROM MT X ext in supine, flex in supine and seated x 10 each x x Ext/flex  Both supine/prone  x x Flex in seated ext in supine All 3 positions Supine/prone   Ultrasound [place]             Exercise/Activity              Quad set TE 20 x 10" x Heel on foam Foam  20/10"  x x x x 1/2 foam heel  20/5"   Heel slides TE X 20 with strap, foot on ball X no ball 10x10" Ball/strap  X10/10"  x x x 20/5" x   UBKFO TE Black x 20 x  BLK  tband  x20ea  BLK x20ea x BLK  x20ea x   Bridges/ball TE         Bridges w/ ball  x10 x20   SLR TE X 10 x  2x10   1.5# x10  x 1.5#x15   SAQ/LAQ TE SAQ x 2 p  LAQ x 20 X 20 LAQ  1.5# both x10ea  Focus quad set  2# ea 2.5# LAQ   x10 2 x 10 2.5# LAQ  x20 2.5#x20   Prone knee hang TE 2' x  x2'   x2' x x x   Quadriped knee flex stretch TE p      10/5" x x x   Prone ham curls TE X 20 towel under thigh x  x20   x10A  x10AA x x10 A  x10 AA x20 A   Strap Hamstring/Quad stretch TE 3 x 30" each x  x   2/30sec x Supine ham/  Prone quad 2/30sec x   Stair Stretches TE  flex, ext 10 x 10" each x xx xx xx xx xx  xx xx   Standing alternating-hip abd, hip ext TA Red x 20 each x           Minisquats/HR TA X 20 each x  x20ea     Calf raises on 1/2 roll  x20 1/2 roll x20   TKE TA   Green 10x10" BTB  20/10" x x BTB  20/10" x BLK  20/10" BLK x20   Sidestepping TA p            Ascending/descending stairs reciprocally TA             Forward step ups/Lateral Step ups TA 8" x 20 each x  Both  x20 ea         Forward step downs>dips TA 4" x 20 x  x   6" FSD  x10  6" FSD  X 20 8" FSD  x10   Cable column/Totalgym TE             LegPress TE             Recumbent bike/elliptical TE Seat 11 x 10 x Seat 9 for flex Seat 10  x10' Seat 9 for flex X EOS x10 EOS  Seat 9   M5 X rec elliptical seat 7 RB seat 9  x10 RB 9 x10'   Gait training GT             HEP updated              Gait training [id number] GT              Moist Heat/Cold Pack 97010   EStim 97014     MH/CP    ES 10 10 10 10  10 10 10 10 10    Manual Therapy 97140 MT 15 15 15 15  15 15 15 10 10    Ultrasound 97035  [place]             Therapeutic exercise 97110 TE 35 35 25 30 20 30 30 30 35 35    Therapeutic Activity 97530 TA 15 15 5 10   10 10 10 10    Neuromuscular Reeducation 97112  NMR             TOTAL TREATMENT TIME  (untimed+timed minutes)  95 85 55 65 40 55 65 65 65 65    TIMED CODE TREATMENT  (timed code minutes-  shared minutes)  65 65 45  55 20 45 55 55 55 55   ** Medicare-add KX at 5th visit if PT&ST/10h visit if PT only**           Add KX    Therapist's Initials  AW AW KR HDB KR KR HDB AW HDB HDB             Lower Extremity Therapy Flow Sheet  Joel Graham     Classify Intervention   1 2 3 4 5 6 7 8 9 10    Date:  09/28/18 09/30/18 10/02/2018 10/04/2018 10/11/18 10/13/2018 10/15/18 10/18/18 10/20/2018 10/22/18   Non timed procedure              Evaluation/re-eval/  D/C  IE            Moist heat/cold pack   MH/CP MH x10' supine BOS and EOS  bolster under knee BOS xx xx X    Declined EOS x x x x declined   Electrical Stimulation   ES             Timed procedure                STM/MFR MT p scar (once steri strips off), retro for swelling Retro knee for swelling X plus quad in sitting x x X plus GT3 x Post knee in straight  incision and quad in flex x x   Joint mobilization MT p patella x x x x x x x     Manual stretching/PassiveROM MT p flex, ext x X flex in sitting with tibial IR and distraction x x X plus ext in supine x 10 each x Ext/flex in supine and prone X ext in supine, flex in supine and seated x 10 each xx   Ultrasound [place]             Exercise/Activity              Quad set TE 20x5" x x x x x x x  20x10" on 1/2 foam   Heel slides TE x20 with SOS x10 x X 20  x x x Blue pball under heel  With strap  20/5" x With SOS 15x10"    Hip add ball/ Hip abd Tband TE p Ball squeeze 20x5"    UBKF green x20 xx DC ball sq        x UBKF green x20 x x UBKF  BTB  20/5"   X black x   Bridges with ball squeeze TE p   X 20 x x x W/ ball  x20 DC    March>SLR TE p       SLR  10/3" x    SAQ/LAQ TE x20 ea xx xx X LAQ xx xx xx Both  x20ea xx 1# ea   Prone knee hang TE         2'    Sidelying hip abd/clams TE p            Prone ham curls TE p       x20  2 Towel folded thigh x    Strap Hamstring/Quad stretch TE p   2 x 30" HS X   X  Plus prone quad stretch 2 x 30" xx Calf/ham  2/30sec X HS and prone quad 3 x 30" each x   Stair Stretches TE p stair stretches flex, ext  10 x 5" each x 3x20" ea 10 x 5" 3x20" ea x xx 10x10" ea  Standing alternating-  marches, ham curls, hip abd, hip ext TA p        X 20 ext, abd with red band    Minisquats/HR TA p   X 20  HR x20 x  x Xx plus mini squats    Tandem>SLS NMR p            Wobbleboard/BAPS NMR             TKE TA p       GTB  10/10"  p   Sidestepping TA p            Ascending/descending stairs reciprocally TA   4" x 5          Forward step ups/Lateral Step ups TA p  FSU 6" x 10   FSU 6" x 10  LSU 6" x 10 X 20 x20 ea 8"  xx Both 8"  x20ea xx    Forward step downs>dips TA p     4" x 20 x FSD 4" x20 x    Cable column/Totalgym TE             LegPress TE             Recumbent bike/elliptical TE p RB seat 12 rocking x8' X seat 11 x Full rev x10' x x M2 seat 11  x10 x BOS   Treadmill/Elliptical TE                           Gait training GT             HEP updated  As above; printout provided    Educated on swelling and elevation            Gait training 16109 GT              Moist Heat/Cold Pack 97010   EStim 97014     MH/CP    ES  Manual Therapy 97140 MT  Ultrasound 97035  [place]             Therapeutic exercise 97110 TE   Therapeutic Activity 97530 TA   Neuromuscular Reeducation 97112  NMR             TOTAL TREATMENT TIME   (untimed+timed minutes)    TIMED CODE TREATMENT  (timed code minutes-  shared minutes)    ** Medicare-add KX at 5th visit if PT&ST/10h visit if PT only**           Add KX    Therapist's Initials  KR KR AW AW KR AW KR HDB AW KR

## 2018-12-14 NOTE — Progress Notes (Signed)
 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]   Referral Source: [name], MD     Date:12/14/2018  Visit #: 27     Treatment Area: L TKR  Date of Surgery: 09/08/18  Protocol  []  Yes  Precautions/Restrictions:  Patients prefers to be treated behind curtain when appropriate:  []  Yes   Patient has therapist preference:   []  Male         []  Male         SUBJECTIVE  Pain Level (0-10 scale):               Pain Level (0-10 scale) post treatment:   Medication Changes: []  No    []  Yes (see paper medication log)  Subjective functional status/changes:   []  No changes reported    Pt reports that he went up and down the stairs a number of times yesterday so he had more spasms.     OBJECTIVE    []  Patient is post-surgical and on a strict surgical protocol that must be followed per MD instructions and to benefit the patient's successful recovery, so treatment started today    Modality: [x]  see flow sheet      Therapeutic Activities: [x]  see flow sheet     []  Other:_  Manual Therapy:  []  see flow sheet     []  Other:_    Patient Education: [x]  Review HEP    []  Progressed/Changed HEP  []  positioning   []  posture/body mechanics   []  transfers   []  heat/ice application    Other Objective/Functional Measures:  [x]  see evaluation/re-evaluation/progress         ASSESSMENT  []   See evaluation/progress note/reevaluation    Pt without significant spasming during visit.  Knee flexion PROM measured at 107 degrees.  Added resisted adduction with theraband to program.    Short Term Goals: To be accomplished in 3 weeks:  1)      The patient will demonstrate independence with HEP.  2)      The patient will ambulate with proper mechanics during stance phase of gait with full terminal hip/knee extension and 4/5 strength >200 ft.  Long Term Goals: To be accomplished in 6  weeks:   1)      The patient will be able to return to all household and full work activities without pain as demonstrated by LEFS>60.  2)      The patient will ascend/descend 1 flight of stairs with reciprocal pattern secondary to full LE ROM and 5/5 strength.  3)      The patient will be able to stand for 60 minutes without pain secondary to strength 5/5 in order to perform woodworking.   []   Patient will continue to benefit from skilled therapy to address remaining goals     PLAN  [x]   Upgrade activities as tolerated      [x]  Frequency/duration: 3x/week for 6 weeks  POC ends (date): 12/01/18  [x]   Continue plan of care    []   Discharge due to:_         [name]  12/14/2018  Lower Extremity Therapy Flow Sheet  Joel Graham     Classify Intervention   21 22 23 24 25 26 27      5   11/17/2018 11/19/18 12/01/18 12/03/2018 12/07/2018 12/09/2018 12/14/2018                        x          Moist heat/cold pack   MH/CP MH x10' supine MHx10' x x x x x      Electrical Stimulation   ES             Timed procedure                STM/MFR MT Quad and hamstring STM x x x x x x      Joint mobilization MT patella inf/sup grade 4 x 10 each x x x x x x      Manual stretching/PassiveROM MT X ext in supine Supine/sit  /prone X supine x x x x      Ultrasound US              Exercise/Activity              Quad set TE 20 x 10 x           Heel slides TE X 20 with strap, foot on ball x X strap and slide board x x x x      UBKFO TE Black x 20 BLK  x20ea x x x x x      Bridges with ball TE X 20  x20 x x x x x      SLR TE 1.5# X 15 1.5# x20 x x 2# x x      LAQ TE 2.5# x 20 2.5# x20 x x 3# x       Prone knee hang TE 2' x x X  x x x      Quadriped knee flex stretch TE 10 x 5 2/30sec x x x x x      Prone ham curls TE X 20 towel under thigh x20ea x x x x       Strap Hamstring/Quad stretch TE Supine ham/  Prone quad 2/30sec x x x x x x      Stair Stretches TE  flex,  ext 10 x 10 each x x    10 x 10      Standing alternating-hip abd, hip ext TA Red x 20 each GTB  x20ea x x x x X plus adduction      Minisquats/HR TA X 20 each  HR on 1/2 roll x           TKE TA Black 20 x 5 Silver  20/5 x x x x x      Sidestepping TA p            Ascending/descending stairs reciprocally TA             Forward step ups/Lateral Step ups TA   LSU 8 x20 x x x      Forward step downs>dips TA 8 x 20 FSD 8  x20 x x x x x      Cable column/Totalgym TE             LegPress TE             Recumbent bike/elliptical TE Seat 9 x 10' NT  In use  x x x x      Gait training GT             HEP updated              Gait training 02883 GT              Moist Heat/Cold Pack 97010   EStim 97014     MH/CP    ES 10 10 10 10 10 10 10       Manual Therapy 97140 MT 15 10 15 15 15 15 15       Ultrasound 97035  [place]              Therapeutic exercise 97110 TE 35 25 25 35 35 36 36      Therapeutic Activity 97530 TA 15 15 15 15 15 15 15       Neuromuscular Reeducation 97112  NMR             TOTAL TREATMENT TIME  (untimed+timed minutes)  95 60 75 90 80 76       TIMED CODE TREATMENT  (timed code minutes-  shared minutes)  65 50 55 65 65 66 66      ** Medicare-add KX at 5th visit if PT&ST/10h visit if PT only**           Add KX    Therapist's Initials  AW HDB AW DD DD AW AW                               Lower Extremity Therapy Flow Sheet  Karla Vines     Classify Intervention   11 12 13 14 15 16 17 18 19 20    Date:  10/25/2018 10/27/2018 10/29/18 11/01/17 11/03/18 11/05/18 11/08/18 11/10/2018 11/12/18 11/15/18   Non timed procedure              Evaluation/re-eval/  D/C      RE        Moist heat/cold pack   MH/CP MH x10' supine x x x  x MH x10 x x x   Electrical Stimulation   ES             Timed procedure                STM/MFR MT Post knee in straight  incision and quad in flex x x Distal quad/incision in full flex   Distal quad  In full flex supine Quad and hamstring STM Ham/quad in flex x   Joint mobilization MT p patella X inf/sup x     Mulligan belt seated distraction with flex  Manual distraction Mulligan  seated flex/distract Mulligan seated distract   Manual stretching/PassiveROM MT X ext in supine, flex in supine and seated x 10 each x x Ext/flex  Both supine/prone  x x Flex in seated ext in supine All 3 positions Supine/prone   Ultrasound US              Exercise/Activity              Quad set TE 20 x 10 x Heel on foam Foam  20/10  x x x x 1/2 foam heel  20/5   Heel slides TE X 20 with strap, foot on ball X no ball 10x10 Ball/strap  X10/10  x x x 20/5 x   UBKFO TE Black x 20 x  BLK  tband  x20ea  BLK x20ea x BLK  x20ea x   Bridges/ball TE         Bridges w/ ball  x10 x20   SLR TE X 10 x  2x10   1.5# x10  x 1.5#x15   SAQ/LAQ TE SAQ x 2 p  LAQ x 20 X 20 LAQ  1.5# both x10ea  Focus quad set  2# ea 2.5# LAQ   x10 2 x 10 2.5# LAQ  x20 2.5#x20   Prone knee hang TE 2' x  x2'   x2' x x x   Quadriped knee flex stretch TE p      10/5 x x x   Prone ham curls TE X 20 towel under thigh x  x20   x10A  x10AA x x10 A  x10 AA x20 A   Strap Hamstring/Quad stretch TE 3 x 30 each x  x   2/30sec x Supine ham/  Prone quad 2/30sec x   Stair Stretches TE  flex, ext 10 x 10 each x xx xx xx xx xx  xx xx   Standing alternating-hip abd, hip ext TA Red x 20 each x           Minisquats/HR TA X 20 each x  x20ea     Calf raises on 1/2 roll  x20 1/2 roll x20   TKE TA   Green 10x10 BTB  20/10 x x BTB  20/10 x BLK  20/10 BLK x20   Sidestepping TA p            Ascending/descending stairs reciprocally TA             Forward step ups/Lateral Step ups TA 8 x 20 each x  Both  x20 ea         Forward step downs>dips TA 4 x 20 x  x   6 FSD  x10  6 FSD  X 20 8 FSD  x10   Cable column/Totalgym TE             LegPress TE             Recumbent bike/elliptical TE Seat 11 x 10 x Seat 9 for flex Seat 10  x10' Seat 9 for flex X EOS x10 EOS  Seat 9  M5 X rec elliptical seat 7 RB seat 9  x10 RB 9 x10'   Gait training GT             HEP updated              Gait training 02883  GT              Moist Heat/Cold Pack 97010   EStim 97014     MH/CP    ES 10 10 10 10  10 10 10 10 10    Manual Therapy 97140 MT 15 15 15 15  15 15 15 10 10    Ultrasound 97035  US              Therapeutic exercise 97110 TE 35 35 25 30 20 30 30 30 35 35    Therapeutic Activity 97530 TA 15 15 5 10   10 10 10 10    Neuromuscular Reeducation 97112  NMR             TOTAL TREATMENT TIME  (untimed+timed minutes)  95 85 55 65 40 55 65 65 65 65    TIMED CODE TREATMENT  (timed code minutes-  shared minutes)  65 65 45  55 20 45 55 55 55 55   ** Medicare-add KX at 5th visit if PT&ST/10h visit if PT only**           Add KX    Therapist's Initials  AW AW KR HDB KR KR HDB AW HDB HDB             Lower Extremity Therapy Flow Sheet  Krue Peterka     Classify Intervention   1 2 3 4 5 6 7 8 9 10    Date:  09/28/18 09/30/18 10/02/2018 10/04/2018 10/11/18 10/13/2018 10/15/18 10/18/18 10/20/2018 10/22/18   Non timed procedure              Evaluation/re-eval/  D/C  IE            Moist heat/cold pack   MH/CP MH x10' supine BOS and EOS  bolster under knee BOS xx xx X    Declined EOS x x x x declined   Electrical Stimulation   ES             Timed procedure                STM/MFR MT p scar (once steri strips off), retro for swelling Retro knee for swelling X plus quad in sitting x x X plus GT3 x Post knee in straight  incision and quad in flex x x   Joint mobilization MT p patella x x x x x x x     Manual stretching/PassiveROM MT p flex, ext x X flex in sitting with tibial IR and distraction x x X plus ext in supine x 10 each x Ext/flex in supine and prone X ext in supine, flex in supine and seated x 10 each xx   Ultrasound US              Exercise/Activity              Quad set TE 20x5 x x x x x x x  20x10 on 1/2 foam   Heel slides TE x20 with SOS x10 x X 20  x x x Blue pball under heel  With strap  20/5 x With SOS 15x10   Hip add ball/ Hip abd Tband TE p Ball squeeze 20x5    UBKF green x20 xx DC ball sq        x UBKF green x20 x x UBKF  BTB  20/5   X  black x   Bridges with ball squeeze TE p   X 20 x x x W/ ball  x20 DC    March>SLR TE p       SLR  10/3 x    SAQ/LAQ TE x20 ea xx xx X LAQ xx xx xx Both  x20ea xx 1# ea   Prone knee hang TE         2'    Sidelying hip abd/clams TE p            Prone ham curls TE p       x20  2 Towel folded thigh x    Strap Hamstring/Quad stretch TE p   2 x 30 HS X   X  Plus prone quad stretch 2 x 30 xx Calf/ham  2/30sec X HS and prone quad 3 x 30 each x   Stair Stretches TE p stair stretches flex, ext  10 x 5 each x 3x20 ea 10 x 5 3x20 ea x xx 10x10 ea  Standing alternating-  marches, ham curls, hip abd, hip ext TA p        X 20 ext, abd with red band    Minisquats/HR TA p   X 20  HR x20 x  x Xx plus mini squats    Tandem>SLS NMR p            Wobbleboard/BAPS NMR             TKE TA p       GTB  10/10  p   Sidestepping TA p            Ascending/descending stairs reciprocally TA   4 x 5          Forward step ups/Lateral Step ups TA p  FSU 6 x 10   FSU 6 x 10  LSU 6 x 10 X 20 x20 ea 8  xx Both 8  x20ea xx    Forward step downs>dips TA p     4 x 20 x FSD 4 x20 x    Cable column/Totalgym TE             LegPress TE             Recumbent bike/elliptical TE p RB seat 12 rocking x8' X seat 11 x Full rev x10' x x M2 seat 11  x10 x BOS   Treadmill/Elliptical TE                           Gait training GT             HEP updated  As above; printout provided    Educated on swelling and elevation            Gait training 02883 GT              Moist Heat/Cold Pack 97010   EStim 97014     MH/CP    ES  20 20 20 10 10 10 10 10     Manual Therapy 97140 MT  20 15 15 15 15 15 10 15 15    Ultrasound 97035  US              Therapeutic exercise 97110 TE 25 40 38 30 25 30 30 35 35 40    Therapeutic Activity 97530 TA   15 15 15 15 10 10 15     Neuromuscular Reeducation 97112  NMR             TOTAL TREATMENT TIME  (untimed+timed minutes)  55 80 88 90 65 70 65 65 85 55    TIMED CODE TREATMENT  (timed code minutes-  shared minutes)  25 60 68 60 55  60 55 55 65 55   ** Medicare-add KX at 5th visit if PT&ST/10h visit if PT only**           Add KX    Therapist's Initials  KR KR AW AW KR AW KR HDB AW KR

## 2018-12-15 ENCOUNTER — Encounter: Payer: PRIVATE HEALTH INSURANCE | Primary: Family Medicine

## 2018-12-17 ENCOUNTER — Inpatient Hospital Stay: Admit: 2018-12-17 | Payer: PRIVATE HEALTH INSURANCE | Primary: Family Medicine

## 2018-12-17 NOTE — Progress Notes (Signed)
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      OBJECTIVE     Patient is post-surgical and on a strict surgical protocol that must be followed per MD instructions and to benefit the patient's successful recovery, so treatment started today    Modality:  see flow sheet      Therapeutic Activities:  see flow sheet      Other:_  Manual Therapy:   see flow sheet      Other:_    Patient Education:  Review HEP     Progressed/Changed HEP   positioning    posture/body mechanics    transfers    heat/ice application    Other Objective/Functional Measures:   see evaluation/re-evaluation/progress         ASSESSMENT    See evaluation/progress note/reevaluation  Spasms noted during PROM preventing relaxation for stretching. Progressed exercises as below without increase in pain.    Short Term Goals: To be accomplished in 3 weeks:  1)      The patient will demonstrate independence with HEP.  2)      The patient will ambulate with proper mechanics during stance phase of gait with full terminal hip/knee extension and 4/5 strength >200 ft.  Long Term Goals: To be accomplished in 6 weeks:    1)      The patient will be able to return to all household and full work activities without pain as demonstrated by LEFS>60.  2)      The patient will ascend/descend 1 flight of stairs with reciprocal pattern secondary to full LE ROM and 5/5 strength.  3)      The patient will be able to stand for 60 minutes without pain secondary to strength 5/5 in order to perform woodworking.     Patient will continue to benefit from skilled therapy to address remaining goals     PLAN    Upgrade activities as tolerated       Frequency/duration: 3x/week for 6 weeks  POC ends (date): 12/01/18    Continue plan of care      Discharge due to:_         Tresa Endo L Freeda Spivey  12/17/2018  Lower Extremity Therapy Flow Sheet  Joel Graham     Classify Intervention   21 22 23  24[ 25 26 27 28     5   11/17/2018 11/19/18 12/01/18 12/03/2018 12/07/2018 12/09/2018 12/14/2018 12/17/18                       x          Moist heat/cold pack   MH/CP MH x10' supine MHx10' x x x x x x     Electrical Stimulation   ES             Timed procedure                STM/MFR MT Quad and hamstring STM x x x x x x x     Joint mobilization MT patella inf/sup grade 4 x 10 each x x x x x x x     Manual stretching/PassiveROM MT X ext in supine Supine/sit  /prone X supine x x x x x     Ultrasound [place]             Exercise/Activity              Quad set TE 20 x 10" x           Heel slides TE X 20 with strap, foot on ball x X strap and slide board x x x x x     UBKFO TE Black x 20 BLK  x20ea x x x x x Clams blue x20     Bridges with ball TE X 20  x20 x x x x x On Pball     SLR TE 1.5# X 15 1.5# x20 x x 2# x x 3#     LAQ TE 2.5# x 20 2.5# x20 x x 3# x       Prone knee hang TE 2' x x X  x x x x     Quadriped knee flex stretch TE 10 x 5" 2/30sec" x x x x x x     Prone ham curls TE X 20 towel under thigh x20ea x x x x  3#     Strap Hamstring/Quad stretch TE Supine ham/   Prone quad 2/30sec x x x x x x xx     Stair Stretches TE  flex, ext 10 x 10" each x x    10 x 10" x     Standing alternating-hip abd, hip ext TA Red x 20 each GTB  x20ea x x x x X plus adduction      Minisquats/HR TA X 20 each  HR on 1/2 roll x           TKE TA Black 20 x 5" Silver  20/5" x x x x x      Sidestepping TA p            Ascending/descending stairs reciprocally TA             Forward step ups/Lateral Step ups TA   LSU 8" x20 x x x X and FSU     Forward step downs>dips TA 8" x 20 FSD 8"  x20 x x x x x x     Cable column/Totalgym TE             LegPress TE             Recumbent bike/elliptical TE Seat 9 x 10'  NT  In use  x x x x X lvl 8     Gait training GT             HEP updated              Gait training 16109 GT              Moist Heat/Cold Pack 97010   EStim 97014     MH/CP    ES Manual Therapy 97140 MT Ultrasound 97035  [place]             Therapeutic exercise 97110 TE 35 25 25 35 35 36 36 30     Therapeutic Activity 97530 TA Neuromuscular Reeducation 97112  NMR             TOTAL TREATMENT TIME  (untimed+timed minutes)  95 60 75 90 80 76  65     TIMED CODE TREATMENT  (timed code minutes-  shared minutes)  65 50 55 65 65 66 66 55     ** Medicare-add KX at 5th visit if PT&ST/10h visit if PT only**           Add KX    Therapist's Initials  AW HDB AW DD DD AW AW KR                              Lower Extremity Therapy Flow Sheet  Joel Graham     Classify Intervention   Date:  10/25/2018 10/27/2018 10/29/18 11/01/17 11/03/18 11/05/18 11/08/18 11/10/2018 11/12/18 11/15/18   Non timed procedure              Evaluation/re-eval/  D/C      RE        Moist heat/cold pack   MH/CP MH x10' supine x x x  x MH x10 x x x   Electrical Stimulation   ES             Timed procedure                STM/MFR MT Post knee in straight  incision and quad in flex x x Distal quad/incision in full flex   Distal quad   In full flex supine Quad and hamstring STM Ham/quad in flex x   Joint mobilization MT p patella X inf/sup x    Mulligan belt seated distraction with flex  Manual distraction Mulligan  seated flex/distract Mulligan seated distract   Manual stretching/PassiveROM MT X ext in supine, flex in supine and seated x 10 each x x Ext/flex  Both supine/prone  x x Flex in seated ext in supine All 3 positions Supine/prone   Ultrasound [place]             Exercise/Activity              Quad set TE 20 x 10" x Heel on foam Foam  20/10"  x x x x 1/2 foam heel  20/5"   Heel slides TE X 20 with strap, foot on ball X no ball 10x10" Ball/strap  X10/10"  x x x 20/5" x   UBKFO TE Black x 20 x  BLK  tband  x20ea   BLK x20ea x BLK  x20ea x   Bridges/ball TE         Bridges w/ ball  x10 x20   SLR TE X 10 x  2x10   1.5# x10  x 1.5#x15   SAQ/LAQ TE SAQ x 2 p  LAQ x 20 X 20 LAQ  1.5# both x10ea  Focus quad set  2# ea 2.5# LAQ   x10 2 x 10 2.5# LAQ  x20 2.5#x20   Prone knee hang TE 2' x  x2'   x2' x x x   Quadriped knee flex stretch TE p      10/5" x x x   Prone ham curls TE X 20 towel under thigh x  x20   x10A  x10AA x x10 A  x10 AA x20 A   Strap Hamstring/Quad stretch TE 3 x 30" each x  x   2/30sec x Supine ham/  Prone quad 2/30sec x   Stair Stretches TE  flex, ext 10 x 10" each x xx xx xx xx xx  xx xx   Standing alternating-hip abd, hip ext TA Red x 20 each x           Minisquats/HR TA X 20 each x  x20ea     Calf raises on 1/2 roll  x20 1/2 roll x20   TKE TA   Green 10x10" BTB  20/10" x x BTB  20/10" x BLK  20/10" BLK x20   Sidestepping TA p            Ascending/descending stairs reciprocally TA             Forward step ups/Lateral Step ups TA 8" x 20 each x  Both  x20 ea         Forward step downs>dips TA 4" x 20 x  x   6" FSD  x10  6" FSD  X 20 8" FSD  x10   Cable column/Totalgym TE             LegPress TE             Recumbent bike/elliptical TE Seat 11 x 10 x Seat 9 for flex Seat 10  x10' Seat 9 for flex X EOS x10 EOS  Seat 9   M5 X rec elliptical seat 7 RB seat 9  x10 RB 9 x10'   Gait training GT             HEP updated              Gait training 76283 GT              Moist Heat/Cold Pack 97010   EStim 97014     MH/CP    ES 10 10 10 10  10 10 10 10 10    Manual Therapy 97140 MT 15 15 15 15  15 15 15 10 10    Ultrasound 97035  [place]             Therapeutic exercise 97110 TE 35 35 25 30 20 30 30 30 35 35    Therapeutic Activity 97530 TA 15 15 5 10   10 10 10 10    Neuromuscular Reeducation 97112  NMR             TOTAL TREATMENT TIME  (untimed+timed minutes)  95 85 55 65 40 55 65 65 65 65    TIMED CODE TREATMENT  (timed code minutes-  shared minutes)  65 65 45 55 20 45 55 55 55 55    ** Medicare-add KX at 5th visit if PT&ST/10h visit if PT only**           Add KX    Therapist's Initials  AW AW KR HDB KR KR HDB AW HDB HDB             Lower Extremity Therapy Flow Sheet  Joel Graham     Classify Intervention   1 2 3 4 5 6 7 8 9 10    Date:  09/28/18 09/30/18 10/02/2018 10/04/2018 10/11/18 10/13/2018 10/15/18 10/18/18 10/20/2018 10/22/18   Non timed procedure              Evaluation/re-eval/  D/C  IE            Moist heat/cold pack   MH/CP MH x10' supine BOS and EOS  bolster under knee BOS xx xx X    Declined EOS x x x x declined   Electrical Stimulation   ES             Timed procedure                STM/MFR MT p scar (once steri strips off), retro for swelling Retro knee for swelling X plus quad in sitting x x X plus GT3 x Post knee in straight  incision and quad in flex x x   Joint mobilization MT p patella x x x x x x x     Manual stretching/PassiveROM MT p flex, ext x X flex in sitting with tibial IR and distraction x x X plus ext in supine x 10 each x Ext/flex in supine and prone X ext in supine, flex in supine and seated x 10 each xx   Ultrasound [place]             Exercise/Activity              Quad set TE 20x5" x x x x x x x  20x10" on 1/2 foam   Heel slides TE x20 with SOS x10 x X 20  x x x Blue pball under heel  With strap  20/5" x With SOS 15x10"    Hip add ball/ Hip abd Tband TE p Ball squeeze 20x5"    UBKF green x20 xx DC ball sq        x UBKF green x20 x x UBKF  BTB  20/5"   X black x   Bridges with ball squeeze TE p   X 20 x x x W/ ball  x20 DC    March>SLR TE p       SLR  10/3" x    SAQ/LAQ TE x20 ea xx xx X LAQ xx xx xx Both  x20ea xx 1# ea   Prone knee hang TE         2'    Sidelying hip abd/clams TE p            Prone ham curls TE p       x20  2 Towel folded thigh x    Strap Hamstring/Quad stretch TE p   2 x 30" HS X   X  Plus prone quad stretch 2 x 30" xx Calf/ham  2/30sec X HS and prone quad 3 x 30" each x   Stair Stretches TE p stair stretches flex, ext  10 x 5" each x 3x20" ea 10 x 5" 3x20"  ea x xx 10x10" ea   Standing alternating-  marches, ham curls, hip abd, hip ext TA p        X 20 ext, abd with red band    Minisquats/HR TA p   X 20  HR x20 x  x Xx plus mini squats    Tandem>SLS NMR p            Wobbleboard/BAPS NMR             TKE TA p       GTB  10/10"  p   Sidestepping TA p            Ascending/descending stairs reciprocally TA   4" x 5          Forward step ups/Lateral Step ups TA p  FSU 6" x 10   FSU 6" x 10  LSU 6" x 10 X 20 x20 ea 8"  xx Both 8"  x20ea xx    Forward step downs>dips TA p     4" x 20 x FSD 4" x20 x    Cable column/Totalgym TE             LegPress TE             Recumbent bike/elliptical TE p RB seat 12 rocking x8' X seat 11 x Full rev x10' x x M2 seat 11  x10 x BOS   Treadmill/Elliptical TE                           Gait training GT             HEP updated  As above; printout provided    Educated on swelling and elevation            Gait training 29562 GT              Moist Heat/Cold Pack 97010   EStim 97014     MH/CP    ES  Manual Therapy 97140 MT  Ultrasound 97035  [place]             Therapeutic exercise 97110 TE   Therapeutic Activity 97530 TA   Neuromuscular Reeducation 97112  NMR             TOTAL TREATMENT TIME   (untimed+timed minutes)    TIMED CODE TREATMENT  (timed code minutes-  shared minutes)    ** Medicare-add KX at 5th visit if PT&ST/10h visit if PT only**           Add KX    Therapist's Initials  KR KR AW AW KR AW KR HDB AW KR

## 2018-12-17 NOTE — Progress Notes (Signed)
 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]   Referral Source: [name], MD     Date:12/17/2018  Visit #: 28     Treatment Area: L TKR  Date of Surgery: 09/08/18  Protocol  []  Yes  Precautions/Restrictions:  Patients prefers to be treated behind curtain when appropriate:  []  Yes   Patient has therapist preference:   []  Male         []  Male         SUBJECTIVE  Pain Level (0-10 scale):               Pain Level (0-10 scale) post treatment:   Medication Changes: []  No    []  Yes (see paper medication log)  Subjective functional status/changes:   []  No changes reported    Pt states the spasms are still waking him up at night.      OBJECTIVE    []  Patient is post-surgical and on a strict surgical protocol that must be followed per MD instructions and to benefit the patient's successful recovery, so treatment started today    Modality: [x]  see flow sheet      Therapeutic Activities: [x]  see flow sheet     []  Other:_  Manual Therapy:  []  see flow sheet     []  Other:_    Patient Education: [x]  Review HEP    []  Progressed/Changed HEP  []  positioning   []  posture/body mechanics   []  transfers   []  heat/ice application    Other Objective/Functional Measures:  [x]  see evaluation/re-evaluation/progress         ASSESSMENT  []   See evaluation/progress note/reevaluation  Spasms noted during PROM preventing relaxation for stretching. Progressed exercises as below without increase in pain.    Short Term Goals: To be accomplished in 3 weeks:  1)      The patient will demonstrate independence with HEP.  2)      The patient will ambulate with proper mechanics during stance phase of gait with full terminal hip/knee extension and 4/5 strength >200 ft.  Long Term Goals: To be accomplished in 6 weeks:   1)      The patient will be able to return to all household  and full work activities without pain as demonstrated by LEFS>60.  2)      The patient will ascend/descend 1 flight of stairs with reciprocal pattern secondary to full LE ROM and 5/5 strength.  3)      The patient will be able to stand for 60 minutes without pain secondary to strength 5/5 in order to perform woodworking.   []   Patient will continue to benefit from skilled therapy to address remaining goals     PLAN  [x]   Upgrade activities as tolerated      [x]  Frequency/duration: 3x/week for 6 weeks  POC ends (date): 12/01/18  [x]   Continue plan of care    []   Discharge due to:_         Burnard L Raquet  12/17/2018  Lower Extremity Therapy Flow Sheet  Joel Graham     Classify Intervention   21 22 23  24[ 25 26 27 28     5   11/17/2018 11/19/18 12/01/18 12/03/2018 12/07/2018 12/09/2018 12/14/2018 12/17/18                       x          Moist heat/cold pack   MH/CP MH x10' supine MHx10' x x x x x x     Electrical Stimulation   ES             Timed procedure                STM/MFR MT Quad and hamstring STM x x x x x x x     Joint mobilization MT patella inf/sup grade 4 x 10 each x x x x x x x     Manual stretching/PassiveROM MT X ext in supine Supine/sit  /prone X supine x x x x x     Ultrasound US              Exercise/Activity              Quad set TE 20 x 10 x           Heel slides TE X 20 with strap, foot on ball x X strap and slide board x x x x x     UBKFO TE Black x 20 BLK  x20ea x x x x x Clams blue x20     Bridges with ball TE X 20  x20 x x x x x On Pball     SLR TE 1.5# X 15 1.5# x20 x x 2# x x 3#     LAQ TE 2.5# x 20 2.5# x20 x x 3# x       Prone knee hang TE 2' x x X  x x x x     Quadriped knee flex stretch TE 10 x 5 2/30sec x x x x x x     Prone ham curls TE X 20 towel under thigh x20ea x x x x  3#     Strap Hamstring/Quad stretch TE Supine ham/  Prone quad 2/30sec x x x x x x xx     Stair Stretches TE  flex, ext 10 x 10 each x x     10 x 10 x     Standing alternating-hip abd, hip ext TA Red x 20 each GTB  x20ea x x x x X plus adduction      Minisquats/HR TA X 20 each  HR on 1/2 roll x           TKE TA Black 20 x 5 Silver  20/5 x x x x x      Sidestepping TA p            Ascending/descending stairs reciprocally TA             Forward step ups/Lateral Step ups TA   LSU 8 x20 x x x X and FSU     Forward step downs>dips TA 8 x 20 FSD 8  x20 x x x x x x     Cable column/Totalgym TE             LegPress TE             Recumbent bike/elliptical TE Seat 9 x 10'  NT  In use  x x x x X lvl 8     Gait training GT             HEP updated              Gait training 02883 GT              Moist Heat/Cold Pack 97010   EStim 97014     MH/CP    ES 10 10 10 10 10 10 10 10      Manual Therapy 97140 MT 15 10 15 15 15 15 15 15      Ultrasound 97035  [place]              Therapeutic exercise 97110 TE 35 25 25 35 35 36 36 30     Therapeutic Activity 97530 TA 15 15 15 15 15 15 15 10      Neuromuscular Reeducation 97112  NMR             TOTAL TREATMENT TIME  (untimed+timed minutes)  95 60 75 90 80 76  65     TIMED CODE TREATMENT  (timed code minutes-  shared minutes)  65 50 55 65 65 66 66 55     ** Medicare-add KX at 5th visit if PT&ST/10h visit if PT only**           Add KX    Therapist's Initials  AW HDB AW DD DD AW AW KR                              Lower Extremity Therapy Flow Sheet  Joel Graham     Classify Intervention   11 12 13 14 15 16 17 18 19 20    Date:  10/25/2018 10/27/2018 10/29/18 11/01/17 11/03/18 11/05/18 11/08/18 11/10/2018 11/12/18 11/15/18   Non timed procedure              Evaluation/re-eval/  D/C      RE        Moist heat/cold pack   MH/CP MH x10' supine x x x  x MH x10 x x x   Electrical Stimulation   ES             Timed procedure                STM/MFR MT Post knee in straight  incision and quad in flex x x Distal quad/incision in full flex   Distal quad  In full flex supine Quad and hamstring STM Ham/quad in flex x   Joint mobilization MT p patella X inf/sup  x    Mulligan belt seated distraction with flex  Manual distraction Mulligan  seated flex/distract Mulligan seated distract   Manual stretching/PassiveROM MT X ext in supine, flex in supine and seated x 10 each x x Ext/flex  Both supine/prone  x x Flex in seated ext in supine All 3 positions Supine/prone   Ultrasound US              Exercise/Activity              Quad set TE 20 x 10 x Heel on foam Foam  20/10  x x x x 1/2 foam heel  20/5   Heel slides TE X 20 with strap, foot on ball X no ball 10x10 Ball/strap  X10/10  x x x 20/5 x   UBKFO TE Black x 20 x  BLK  tband  x20ea   BLK x20ea x BLK  x20ea x   Bridges/ball TE         Bridges w/ ball  x10 x20   SLR TE X 10 x  2x10   1.5# x10  x 1.5#x15   SAQ/LAQ TE SAQ x 2 p  LAQ x 20 X 20 LAQ  1.5# both x10ea  Focus quad set  2# ea 2.5# LAQ   x10 2 x 10 2.5# LAQ  x20 2.5#x20   Prone knee hang TE 2' x  x2'   x2' x x x   Quadriped knee flex stretch TE p      10/5 x x x   Prone ham curls TE X 20 towel under thigh x  x20   x10A  x10AA x x10 A  x10 AA x20 A   Strap Hamstring/Quad stretch TE 3 x 30 each x  x   2/30sec x Supine ham/  Prone quad 2/30sec x   Stair Stretches TE  flex, ext 10 x 10 each x xx xx xx xx xx  xx xx   Standing alternating-hip abd, hip ext TA Red x 20 each x           Minisquats/HR TA X 20 each x  x20ea     Calf raises on 1/2 roll  x20 1/2 roll x20   TKE TA   Green 10x10 BTB  20/10 x x BTB  20/10 x BLK  20/10 BLK x20   Sidestepping TA p            Ascending/descending stairs reciprocally TA             Forward step ups/Lateral Step ups TA 8 x 20 each x  Both  x20 ea         Forward step downs>dips TA 4 x 20 x  x   6 FSD  x10  6 FSD  X 20 8 FSD  x10   Cable column/Totalgym TE             LegPress TE             Recumbent bike/elliptical TE Seat 11 x 10 x Seat 9 for flex Seat 10  x10' Seat 9 for flex X EOS x10 EOS  Seat 9  M5 X rec elliptical seat 7 RB seat 9  x10 RB 9 x10'   Gait training GT             HEP updated              Gait training  02883 GT              Moist Heat/Cold Pack 97010   EStim 97014     MH/CP    ES 10 10 10 10  10 10 10 10 10    Manual Therapy 97140 MT 15 15 15 15  15 15 15 10 10    Ultrasound 97035  [place]              Therapeutic exercise 97110 TE 35 35 25 30 20 30 30 30 35 35    Therapeutic Activity 97530 TA 15 15 5 10   10 10 10 10    Neuromuscular Reeducation 97112  NMR             TOTAL TREATMENT TIME  (untimed+timed minutes)  95 85 55 65 40 55 65 65 65 65    TIMED CODE TREATMENT  (timed code minutes-  shared minutes)  65 65 45 55 20 45 55 55 55 55    ** Medicare-add KX at 5th visit if PT&ST/10h visit if PT only**           Add KX    Therapist's Initials  AW AW KR HDB KR KR HDB AW HDB HDB             Lower Extremity Therapy Flow Sheet  Joel Graham     Classify Intervention   1 2 3 4 5 6 7 8 9 10    Date:  09/28/18 09/30/18 10/02/2018 10/04/2018 10/11/18 10/13/2018 10/15/18 10/18/18 10/20/2018 10/22/18   Non timed procedure              Evaluation/re-eval/  D/C  IE            Moist heat/cold pack   MH/CP MH x10' supine BOS and EOS  bolster under knee BOS xx xx X    Declined EOS x x x x declined   Electrical Stimulation   ES             Timed procedure                STM/MFR MT p scar (once steri strips off), retro for swelling Retro knee for swelling X plus quad in sitting x x X plus GT3 x Post knee in straight  incision and quad in flex x x   Joint mobilization MT p patella x x x x x x x     Manual stretching/PassiveROM MT p flex, ext x X flex in sitting with tibial IR and distraction x x X plus ext in supine x 10 each x Ext/flex in supine and prone X ext in supine, flex in supine and seated x 10 each xx   Ultrasound US              Exercise/Activity              Quad set TE 20x5 x x x x x x x  20x10 on 1/2 foam   Heel slides TE x20 with SOS x10 x X 20  x x x Blue pball under heel  With strap  20/5 x With SOS 15x10   Hip add ball/ Hip abd Tband TE p Ball squeeze 20x5    UBKF green x20 xx DC ball sq        x UBKF green x20 x x  UBKF  BTB  20/5   X black x   Bridges with ball squeeze TE p   X 20 x x x W/ ball  x20 DC    March>SLR TE p       SLR  10/3 x    SAQ/LAQ TE x20 ea xx xx X LAQ xx xx xx Both  x20ea xx 1# ea   Prone knee hang TE         2'    Sidelying hip abd/clams TE p            Prone ham curls TE p       x20  2 Towel folded thigh x    Strap Hamstring/Quad stretch TE p   2 x 30 HS X   X  Plus prone quad stretch 2 x 30 xx Calf/ham  2/30sec X HS and prone quad 3 x 30 each x   Stair Stretches TE p stair stretches flex, ext  10 x 5 each x 3x20 ea 10 x 5 3x20  ea x xx 10x10 ea   Standing alternating-  marches, ham curls, hip abd, hip ext TA p        X 20 ext, abd with red band    Minisquats/HR TA p   X 20  HR x20 x  x Xx plus mini squats    Tandem>SLS NMR p            Wobbleboard/BAPS NMR             TKE TA p       GTB  10/10  p   Sidestepping TA p            Ascending/descending stairs reciprocally TA   4 x 5          Forward step ups/Lateral Step ups TA p  FSU 6 x 10   FSU 6 x 10  LSU 6 x 10 X 20 x20 ea 8  xx Both 8  x20ea xx    Forward step downs>dips TA p     4 x 20 x FSD 4 x20 x    Cable column/Totalgym TE             LegPress TE             Recumbent bike/elliptical TE p RB seat 12 rocking x8' X seat 11 x Full rev x10' x x M2 seat 11  x10 x BOS   Treadmill/Elliptical TE                           Gait training GT             HEP updated  As above; printout provided    Educated on swelling and elevation            Gait training 02883 GT              Moist Heat/Cold Pack 97010   EStim 97014     MH/CP    ES  20 20 20 10 10 10 10 10     Manual Therapy 97140 MT  20 15 15 15 15 15 10 15 15    Ultrasound 97035  [place]              Therapeutic exercise 97110 TE 25 40 38 30 25 30 30 35 35 40    Therapeutic Activity 97530 TA   15 15 15 15 10 10 15     Neuromuscular Reeducation 97112  NMR             TOTAL TREATMENT TIME  (untimed+timed minutes)  55 80 88 90 65 70 65 65 85 55    TIMED CODE TREATMENT  (timed code minutes-  shared  minutes)  25 60 68 60 55 60 55 55 65 55    ** Medicare-add KX at 5th visit if PT&ST/10h visit if PT only**           Add KX    Therapist's Initials  KR KR AW AW KR AW KR HDB AW KR

## 2018-12-20 ENCOUNTER — Inpatient Hospital Stay: Admit: 2018-12-20 | Payer: PRIVATE HEALTH INSURANCE | Primary: Family Medicine

## 2018-12-20 NOTE — Progress Notes (Signed)
[][facility], [place], [place], [place] [phone]   Fax [phone]    [x][facility], [place], [place]  Phone [phone]  Fax [phone]    []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  He thinks the medication Requip seems to help a bit with the spams.  Whenever he goes to relax he feels the spasms.        Assessment: The patient has been compliant with therapy attendance, along with compliant participation with his home exercise program. He has made moderate change in his functional status and his goal progress can be seen below. Certain goals have not be attained secondary to continuation of severe spasms in his LLE which have limited his range of motion and tolerance to some activities in PT.  Patient is being seen by a neurologist for the same.  HE has demonstrated slow, steady progress with his ROM overall.  At this time, patient is safe and independent with HEP and can continue to make gains outside of formal PT.  Patient is therefore appropriate for discharge to HEP.  Recommended patient work on his flexion and extension ROM daily.      Goals: Status Initial Eval: Status last Re-eval/ Progress: Status last Re-eval/ Progress: Current   Status: Goals Met?      1. The patient will demonstrate independence with HEP. initiated Pt performs exercises 1-2x/day performing performing yes   2. The patient will ambulate with proper mechanics during stance phase of gait with full terminal hip/knee extension and 4/5 strength >200 ft. Antalgic, absent TKE, increased antalgia without AD Uses his cane in the middle of the night if he has to get up; cont mod antalgia and absent TKE (no AD) Progressing - no longer using the cane in the middle of the night  progressing   3. The patient will be able to return to all household and full work activities without pain as demonstrated by LEFS>60. LEFS: 9/80; 89% limitation LEFS: 40/80; 50% limitation LEFS: 44/80 LEFS: 47/80 progressing   4. The patient will ascend/descend 1 flight of stairs with reciprocal pattern secondary to full LE ROM and 5/5 strength. Step-to pattern on stairs with cane Sometimes recpirocal pattern on stairs up, step-to pattern down Ascends reciprocally, descends step-to pattern "that's pretty rough" "Descending still rough and painful" progressing   5. The patient will be able to stand for 60 minutes without pain secondary to strength 5/5 in order to perform woodworking.     Unable to stand for a long time Able to stand 45 min without pain "It gets uncomfortable" Gets stiff after an hour yes     Functional Outcome Measures:  LEFS score 9/80; 89% limitation to 40/80; 50% limitation to 44/80 to 47/80  ??  Girth Measurements:??  ?? ????????joint line ??10 cm above jt line ??10 cm below jt line   Left 37.6 cm to 37.2 cm 37.4 cm to 36.8 cm 31.6 cm to 30.9 cm   ??  ROM / Strength  '[]'$ ????Unable to assess????????????????????????????????????AROM ??????????????????????????????????????????                          PROM ??????????????????????????????????              ??Strength (1-5)  ?? ?? Left 11/03/18 Right L 3/11 L 3/30 Left 11/03/18 12/01/18 L 3/30 Left 11/03/18 Right 11/03/18   Hip Flexion ??  ??   ??   ?? '4 5 4 5   '$ ?? Abduction ??  ??   ??   ?? '4 4 4 '$ 4+   ?? Adduction ??  ??   ??   ?? 4+ 4+ 4+ 5    Knee Flexion 72 100 132 105 112 75 105 107 115?? 3- 4+** 5 5   ?? Extension -3 -3 0 -3  ??   ?? 3- 4+** 5 5   Ankle Plantarflexion ??  ??   ??   ?? '5 5 5 5   '$ ?? Dorsiflexion ??  ??   ??   ?? 4 4+ 5 5   ?? Inversion ??  ??   ??   ?? 4+ '5 5 5   '$ ?? Eversion ??  ??   ??   ?? 4+ '5 5 5   '$ ??????????????????????????????????????????????????????????????????????????????????'[]'$ ????all WNLs/WFLs ????????'[]'$ ????all WNLs/WFLs ????????????'[]'$ ????all at least 4/5  **within available range. ??    Rhomberg EC:??30 sec    Tandem L: 30 sec, mod sway  Tandem R: 30 sec, min sway    SLS L: 30 sec, mod sway, pain  SLS R: 30 sec, min sway      Plan:  Discharge to HEP.             [place] 12/20/2018 8:48 AM  ______________________________________________________________________  I certify that the above Therapy Services are being furnished while the patient is under my care. I agree with the treatment plan and certify that this therapy is necessary.  '[]'$  I have read the above and request that my patient continue as recommended  '[]'$  I have read the above report and request that my patient continue therapy with the following changes/special instructions:   '[]'$  I have read the above report and request that my patient be discharged from therapy  Physician's Signature:________________________Date:________Time:_________  Please sign and FAX back to appropriate hospital.

## 2018-12-20 NOTE — Progress Notes (Signed)
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      OBJECTIVE    []  Patient is post-surgical and on a strict surgical protocol that must be followed per MD instructions and to benefit the patient's successful recovery, so treatment started today    Modality: [x]  see flow sheet      Therapeutic Activities: [x]  see flow sheet     []  Other:_  Manual Therapy:  []  see flow sheet     []  Other:_    Patient Education: [x]  Review HEP    []  Progressed/Changed HEP  []  positioning   []  posture/body mechanics   []  transfers   []  heat/ice application    Other Objective/Functional Measures:  [x]  see evaluation/re-evaluation/progress         ASSESSMENT  [x]   See evaluation/progress note/reevaluation    Recommended patient work on his ROM daily.  Patient demonstrates safety and independence with HEP.    Short Term Goals: To be accomplished in 3 weeks:  1)      The patient will demonstrate independence with HEP.  2)      The patient will ambulate with proper mechanics during stance phase of gait with full terminal hip/knee extension and 4/5 strength >200 ft.  Long Term Goals: To be accomplished in 6 weeks:   1)      The patient will be able to return to all household and full work  activities without pain as demonstrated by LEFS>60.  2)      The patient will ascend/descend 1 flight of stairs with reciprocal pattern secondary to full LE ROM and 5/5 strength.  3)      The patient will be able to stand for 60 minutes without pain secondary to strength 5/5 in order to perform woodworking.   []   Patient will continue to benefit from skilled therapy to address remaining goals     PLAN  []   Upgrade activities as tolerated      []  Frequency/duration: 3x/week for 6 weeks  POC ends (date): 12/01/18  []   Continue plan of care    [x]   Discharge due to:_         [name]  12/20/2018  Lower Extremity Therapy Flow Sheet  Dionta Larke     Classify Intervention   24[ 11/17/2018 11/19/18 12/01/18 12/03/2018 12/07/2018 12/09/2018 12/14/2018 12/17/18 12/20/2018                      x          Moist heat/cold pack   MH/CP MH x10' supine MHx10' x x x x x x x    Electrical Stimulation   ES             Timed procedure                STM/MFR MT Quad and hamstring STM x x x x x x x x    Joint mobilization MT patella inf/sup grade 4 x 10 each x x x x x x x x    Manual stretching/PassiveROM MT X ext in supine Supine/sit  /prone X supine x x x x x x    Ultrasound [place]             Exercise/Activity              Quad set TE 20 x 10" x           Heel slides TE X 20 with strap, foot on ball x X strap and slide board x x x x x x    UBKFO TE Black x 20 BLK  x20ea x x x x x Clams blue x20     Bridges with ball TE X 20  x20 x x x x x On Pball x    SLR TE 1.5# X 15 1.5# x20 x x 2# x x 3# x    LAQ TE 2.5# x 20 2.5# x20 x x 3# x       Prone knee hang TE 2' x x X  x x x x x    Quadriped knee flex stretch TE 10 x 5" 2/30sec" x x x x x x x    Prone ham curls TE X 20 towel under thigh x20ea x x x x  3# x    Strap Hamstring/Quad stretch TE Supine ham/  Prone quad 2/30sec x x x x x x xx xx     Stair Stretches TE  flex, ext 10 x 10" each x x    10 x 10" x x    Standing alternating-hip abd, hip ext TA Red x 20 each GTB  x20ea x x x x X plus adduction  x    Minisquats/HR TA X 20 each  HR on 1/2 roll x           TKE TA Black 20 x 5" Silver  20/5" x x x x x      Sidestepping TA p            Ascending/descending stairs reciprocally TA             Forward step ups/Lateral Step ups TA   LSU 8" x20 x x x X and FSU x    Forward step downs>dips TA 8" x 20 FSD 8"  x20 x x x x x x x    Cable column/Totalgym TE             80LegPress TE             Recumbent bike/elliptical TE Seat 9 x 10'  NT  In use  x x x x X lvl 8 x    Gait training GT             HEP updated              Gait training 30865 GT              Moist Heat/Cold Pack 97010   EStim 97014     MH/CP    ES Manual Therapy 97140 MT Ultrasound 97035  [place]             Therapeutic exercise 97110 TE 35 25 25 35 35 36 36 30 30    Therapeutic Activity 97530 TA Neuromuscular Reeducation 97112  NMR             TOTAL TREATMENT TIME  (untimed+timed minutes)  95 60 75 90 80 76  65 75    TIMED CODE TREATMENT  (timed code minutes-  shared minutes)  65 50 55 65 65 66 66 55 54    ** Medicare-add KX at 5th visit if PT&ST/10h visit if PT only**           Add KX    Therapist's Initials  AW HDB AW DD DD AW AW KR AW                             Lower Extremity Therapy Flow Sheet  Eliga Arvie     Classify Intervention   Date:  10/25/2018 10/27/2018 10/29/18 11/01/17 11/03/18 11/05/18 11/08/18 11/10/2018 11/12/18 11/15/18   Non timed procedure              Evaluation/re-eval/  D/C      RE        Moist heat/cold pack   MH/CP MH x10' supine x x x  x MH x10 x x x   Electrical Stimulation   ES             Timed procedure                STM/MFR MT Post knee in straight  incision and quad in flex x x Distal quad/incision in full flex   Distal quad   In full flex supine Quad and hamstring STM Ham/quad in flex x   Joint mobilization MT p patella X inf/sup x    Mulligan belt seated distraction with flex  Manual distraction Mulligan  seated flex/distract Mulligan seated distract   Manual stretching/PassiveROM MT X ext in supine, flex in supine and seated x 10 each x x Ext/flex  Both supine/prone  x x Flex in seated ext in supine All 3 positions Supine/prone   Ultrasound [place]             Exercise/Activity              Quad set TE 20 x 10" x Heel on foam Foam  20/10"  x x x x 1/2 foam heel  20/5"   Heel slides TE X 20 with strap, foot on ball X no ball 10x10" Ball/strap  X10/10"  x x x 20/5" x   UBKFO TE Black x 20 x  BLK  tband  x20ea   BLK x20ea x BLK  x20ea x   Bridges/ball TE         Bridges w/ ball  x10 x20   SLR TE X 10 x  2x10   1.5# x10  x 1.5#x15   SAQ/LAQ TE SAQ x 2 p  LAQ x 20 X 20 LAQ  1.5# both x10ea  Focus quad set  2# ea 2.5# LAQ   x10 2 x 10 2.5# LAQ  x20 2.5#x20   Prone knee hang TE 2' x  x2'   x2' x x x   Quadriped knee flex stretch TE p      10/5" x x x   Prone ham curls TE X 20 towel under thigh x  x20   x10A  x10AA x x10 A  x10 AA x20 A   Strap Hamstring/Quad stretch TE 3 x 30" each x  x   2/30sec x Supine ham/  Prone quad 2/30sec x   Stair Stretches TE  flex, ext 10 x 10" each x xx xx xx xx xx  xx xx   Standing alternating-hip abd, hip ext TA Red x 20 each x           Minisquats/HR TA X 20 each x  x20ea     Calf raises on 1/2 roll  x20 1/2 roll x20   TKE TA   Green 10x10" BTB  20/10" x x BTB  20/10" x BLK  20/10" BLK x20   Sidestepping TA p            Ascending/descending stairs reciprocally TA             Forward step ups/Lateral Step ups TA 8" x 20 each x  Both  x20 ea         Forward step downs>dips TA 4" x 20 x  x   6" FSD  x10  6" FSD  X 20 8" FSD  x10   Cable column/Totalgym TE             LegPress TE             Recumbent bike/elliptical TE Seat 11 x 10 x Seat 9 for flex Seat 10  x10' Seat 9 for flex X EOS x10 EOS  Seat 9   M5 X rec elliptical seat 7 RB seat 9  x10 RB 9 x10'   Gait training GT             HEP updated              Gait training 76283 GT              Moist Heat/Cold Pack 97010   EStim 97014     MH/CP    ES 10 10 10 10  10 10 10 10 10    Manual Therapy 97140 MT 15 15 15 15  15 15 15 10 10    Ultrasound [address]7035  [place]             Therapeutic exercise 97110 TE 35 35 25 30 20 30 30 30 35 35    Therapeutic Activity 97530 TA 15 15 5 10   10 10 10 10    Neuromuscular Reeducation 97112  NMR             TOTAL TREATMENT TIME  (untimed+timed minutes)  95 85 55 65 40 55 65 65 65 65    TIMED CODE TREATMENT  (timed code minutes-  shared minutes)  65 65 45 55 20 45 55 55 55 55    ** Medicare-add KX at 5th visit if PT&ST/10h visit if PT only**           Add KX    Therapist's Initials  AW AW KR HDB KR KR HDB AW HDB HDB             Lower Extremity Therapy Flow Sheet  Chaise [place]     Classify Intervention   1 2 3 4 5 6 7 8 9 10    Date:  09/28/18 09/30/18 10/02/2018 10/04/2018 10/11/18 10/13/2018 10/15/18 10/18/18 10/20/2018 10/22/18   Non timed procedure              Evaluation/re-eval/  D/C  IE            Moist heat/cold pack   MH/CP MH x10' supine BOS and EOS  bolster under knee BOS xx xx X    Declined EOS x x x x declined   Electrical Stimulation   ES             Timed procedure                STM/MFR MT p scar (once steri strips off), retro for swelling Retro knee for swelling X plus quad in sitting x x X plus GT3 x Post knee in straight  incision and quad in flex x x   Joint mobilization MT p patella x x x x x x x     Manual stretching/PassiveROM MT p flex, ext x X flex in sitting with tibial IR and distraction x x X plus ext in supine x 10 each x Ext/flex in supine and prone X ext in supine, flex in supine and seated x 10 each xx   Ultrasound [place]             Exercise/Activity              Quad set TE 20x5" x x x x x x x  20x10" on 1/2 foam   Heel slides TE x20 with SOS x10 x X 20  x x x Blue pball under heel  With strap  20/5" x With SOS 15x10"    Hip add ball/ Hip abd Tband TE p Ball squeeze 20x5"    UBKF green x20 xx DC ball sq        x UBKF green x20 x x UBKF  BTB  20/5"   X black x   Bridges with ball squeeze TE p   X 20 x x x W/ ball  x20 DC    March>SLR TE p       SLR  10/3" x    SAQ/LAQ TE x20 ea xx xx X LAQ xx xx xx Both  x20ea xx 1# ea   Prone knee hang TE         2'    Sidelying hip abd/clams TE p            Prone ham curls TE p       x20  2 Towel folded thigh x    Strap Hamstring/Quad stretch TE p   2 x 30" HS X   X  Plus prone quad stretch 2 x 30" xx Calf/ham  2/30sec X HS and prone quad 3 x 30" each x   Stair Stretches TE p stair stretches flex, ext  10 x 5" each x 3x20" ea 10 x 5" 3x20"  ea x xx 10x10" ea   Standing alternating-  marches, ham curls, hip abd, hip ext TA p        X 20 ext, abd with red band    Minisquats/HR TA p   X 20  HR x20 x  x Xx plus mini squats    Tandem>SLS NMR p            Wobbleboard/BAPS NMR             TKE TA p       GTB  10/10"  p   Sidestepping TA p            Ascending/descending stairs reciprocally TA   4" x 5          Forward step ups/Lateral Step ups TA p  FSU 6" x 10   FSU 6" x 10  LSU 6" x 10 X 20 x20 ea 8"  xx Both 8"  x20ea xx    Forward step downs>dips TA p     4" x 20 x FSD 4" x20 x    Cable column/Totalgym TE             LegPress TE             Recumbent bike/elliptical TE p RB seat 12 rocking x8' X seat 11 x Full rev x10' x x M2 seat 11  x10 x BOS   Treadmill/Elliptical TE                           Gait training GT             HEP updated  As above; printout provided    Educated on swelling and elevation            Gait training 29562 GT              Moist Heat/Cold Pack 97010   EStim 97014     MH/CP    ES  Manual Therapy 97140 MT  Ultrasound 97035  [place]             Therapeutic exercise 97110 TE   Therapeutic Activity 97530 TA   Neuromuscular Reeducation 97112  NMR             TOTAL TREATMENT TIME   (untimed+timed minutes)    TIMED CODE TREATMENT  (timed code minutes-  shared minutes)    ** Medicare-add KX at 5th visit if PT&ST/10h visit if PT only**           Add KX    Therapist's Initials  KR KR AW AW KR AW KR HDB AW KR

## 2018-12-20 NOTE — Progress Notes (Signed)
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      OBJECTIVE    []  Patient is post-surgical and on a strict surgical protocol that must be followed per MD instructions and to benefit the patient's successful recovery, so treatment started today    Modality: [x]  see flow sheet      Therapeutic Activities: [x]  see flow sheet     []  Other:_  Manual Therapy:  []  see flow sheet     []  Other:_    Patient Education: [x]  Review HEP    []  Progressed/Changed HEP  []  positioning   []  posture/body mechanics   []  transfers   []  heat/ice application    Other Objective/Functional Measures:  [x]  see evaluation/re-evaluation/progress         ASSESSMENT  [x]   See evaluation/progress note/reevaluation    Recommended patient work on his ROM daily.  Patient demonstrates safety and independence with HEP.    Short Term Goals: To be accomplished in 3 weeks:  1)      The patient will demonstrate independence with HEP.  2)      The patient will ambulate with proper mechanics during stance phase of gait with full terminal hip/knee extension and 4/5 strength >200 ft.  Long Term Goals: To be accomplished in 6 weeks:   1)      The patient will be able to return to all household and full work  activities without pain as demonstrated by LEFS>60.  2)      The patient will ascend/descend 1 flight of stairs with reciprocal pattern secondary to full LE ROM and 5/5 strength.  3)      The patient will be able to stand for 60 minutes without pain secondary to strength 5/5 in order to perform woodworking.   []   Patient will continue to benefit from skilled therapy to address remaining goals     PLAN  []   Upgrade activities as tolerated      []  Frequency/duration: 3x/week for 6 weeks  POC ends (date): 12/01/18  []   Continue plan of care    [x]   Discharge due to:_         [name]  12/20/2018  Lower Extremity Therapy Flow Sheet  Perrion Neault     Classify Intervention   21 22 23  24[ 25 26 27 28 29    5   11/17/2018 11/19/18 12/01/18 12/03/2018 12/07/2018 12/09/2018 12/14/2018 12/17/18 12/20/2018                      x          Moist heat/cold pack   MH/CP MH x10' supine MHx10' x x x x x x x    Electrical Stimulation   ES             Timed procedure                STM/MFR MT Quad and hamstring STM x x x x x x x x    Joint mobilization MT patella inf/sup grade 4 x 10 each x x x x x x x x    Manual stretching/PassiveROM MT X ext in supine Supine/sit  /prone X supine x x x x x x    Ultrasound [place]             Exercise/Activity              Quad set TE 20 x 10" x           Heel slides TE X 20 with strap, foot on ball x X strap and slide board x x x x x x    UBKFO TE Black x 20 BLK  x20ea x x x x x Clams blue x20     Bridges with ball TE X 20  x20 x x x x x On Pball x    SLR TE 1.5# X 15 1.5# x20 x x 2# x x 3# x    LAQ TE 2.5# x 20 2.5# x20 x x 3# x       Prone knee hang TE 2' x x X  x x x x x    Quadriped knee flex stretch TE 10 x 5" 2/30sec" x x x x x x x    Prone ham curls TE X 20 towel under thigh x20ea x x x x  3# x    Strap Hamstring/Quad stretch TE Supine ham/  Prone quad 2/30sec x x x x x x xx xx    Stair Stretches TE  flex, ext 10 x 10"  each x x    10 x 10" x x    Standing alternating-hip abd, hip ext TA Red x 20 each GTB  x20ea x x x x X plus adduction  x    Minisquats/HR TA X 20 each  HR on 1/2 roll x           TKE TA Black 20 x 5" Silver  20/5" x x x x x      Sidestepping TA p            Ascending/descending stairs reciprocally TA             Forward step ups/Lateral Step ups TA   LSU 8" x20 x x x X and FSU x    Forward step downs>dips TA 8" x 20 FSD 8"  x20 x x x x x x x    Cable column/Totalgym TE             80LegPress TE             Recumbent bike/elliptical TE Seat 9 x 10'  NT  In use  x x x x X lvl 8 x    Gait training GT             HEP updated              Gait training 63875 GT              Moist Heat/Cold Pack 97010   EStim 97014     MH/CP    ES 10 10 10 10 10 10 10 10 10     Manual Therapy 97140 MT 15 10 15 15 15 15 15 15 14     Ultrasound 97035  [place]             Therapeutic exercise 97110 TE 35 25 25 35 35 36 36 30 30    Therapeutic Activity 97530 TA 15 15 15 15 15 15 15 10 10     Neuromuscular Reeducation 97112  NMR             TOTAL TREATMENT TIME  (untimed+timed minutes)  95 60 75 90 80 76  65 75    TIMED CODE TREATMENT  (timed code minutes-  shared minutes)  65 50 55 65 65 66 66 55 54    ** Medicare-add KX at 5th visit if PT&ST/10h visit if PT only**           Add KX    Therapist's Initials  AW HDB AW DD DD AW AW KR AW                             Lower Extremity Therapy Flow Sheet  Brodie Christin     Classify Intervention   11 12 13 14 15 16 17 18 19 20    Date:  10/25/2018 10/27/2018 10/29/18 11/01/17 11/03/18 11/05/18 11/08/18 11/10/2018 11/12/18 11/15/18   Non timed procedure              Evaluation/re-eval/  D/C      RE        Moist heat/cold pack   MH/CP MH x10' supine x x x  x MH x10 x x x   Electrical Stimulation   ES             Timed procedure                STM/MFR MT Post knee in straight  incision and quad in flex x x Distal quad/incision in full flex   Distal quad  In full flex supine Quad and hamstring STM Ham/quad in flex x   Joint  mobilization MT p patella X inf/sup x    Mulligan belt seated distraction with flex  Manual distraction Mulligan  seated flex/distract Mulligan seated distract   Manual stretching/PassiveROM MT X ext in supine, flex in supine and seated x 10 each x x Ext/flex  Both supine/prone  x x Flex in seated ext in supine All 3 positions Supine/prone   Ultrasound [place]             Exercise/Activity              Quad set TE 20 x 10" x Heel on foam Foam  20/10"  x x x x 1/2 foam heel  20/5"   Heel slides TE X 20 with strap, foot on ball X no ball 10x10" Ball/strap  X10/10"  x x x 20/5" x   UBKFO TE Black x 20 x  BLK  tband  x20ea   BLK x20ea x BLK  x20ea x   Bridges/ball TE         Bridges w/ ball  x10 x20   SLR TE X 10 x  2x10   1.5# x10  x 1.5#x15   SAQ/LAQ TE SAQ x 2 p  LAQ x 20 X 20 LAQ  1.5# both x10ea  Focus quad set  2# ea 2.5# LAQ   x10 2 x 10 2.5# LAQ  x20 2.5#x20   Prone knee hang TE 2' x  x2'   x2' x x x   Quadriped knee flex stretch TE p      10/5" x x x   Prone ham curls TE X 20 towel under thigh x  x20   x10A  x10AA x x10 A  x10 AA x20 A   Strap Hamstring/Quad stretch TE 3 x 30" each x  x   2/30sec x Supine ham/  Prone quad 2/30sec x   Stair Stretches TE  flex, ext 10 x 10" each x xx xx xx xx xx  xx xx   Standing alternating-hip abd, hip ext TA Red x 20 each x           Minisquats/HR TA X 20 each x  x20ea     Calf raises on 1/2 roll  x20 1/2 roll x20   TKE TA   Green 10x10" BTB  20/10" x x BTB  20/10" x BLK  20/10" BLK x20   Sidestepping TA p            Ascending/descending stairs reciprocally TA             Forward step ups/Lateral Step ups TA 8" x 20 each x  Both  x20 ea         Forward step downs>dips TA 4" x 20 x  x   6" FSD  x10  6" FSD  X 20 8" FSD  x10   Cable column/Totalgym TE             LegPress TE             Recumbent bike/elliptical TE Seat 11 x 10 x Seat 9 for flex Seat 10  x10' Seat 9 for flex X EOS x10 EOS  Seat 9  M5 X rec elliptical seat 7 RB seat 9  x10 RB 9 x10'   Gait training GT             HEP  updated              Gait training 16109 GT              Moist Heat/Cold Pack 97010   EStim 97014     MH/CP    ES 10 10 10 10  10 10 10 10 10    Manual Therapy 97140 MT 15 15 15 15  15 15 15 10 10    Ultrasound 97035  [place]             Therapeutic exercise 97110 TE 35 35 25 30 20 30 30 30 35 35    Therapeutic Activity 97530 TA 15 15 5 10   10 10 10 10    Neuromuscular Reeducation 97112  NMR             TOTAL TREATMENT TIME  (untimed+timed minutes)  95 85 55 65 40 55 65 65 65 65    TIMED CODE TREATMENT  (timed code minutes-  shared minutes)  65 65 45 55 20 45 55 55 55 55    ** Medicare-add KX at 5th visit if PT&ST/10h visit if PT only**           Add KX    Therapist's Initials  AW AW KR HDB KR KR HDB AW HDB HDB             Lower Extremity Therapy Flow Sheet  Nelvin Mccowan     Classify Intervention   1 2 3 4 5 6 7 8 9 10    Date:  09/28/18 09/30/18 10/02/2018 10/04/2018 10/11/18 10/13/2018 10/15/18 10/18/18 10/20/2018 10/22/18   Non timed procedure              Evaluation/re-eval/  D/C  IE            Moist heat/cold pack   MH/CP MH x10' supine BOS and EOS  bolster under knee BOS xx xx X    Declined EOS x x x x declined   Electrical Stimulation   ES             Timed procedure                STM/MFR MT p scar (once steri strips off), retro for swelling Retro knee for swelling X plus quad in sitting x x X plus GT3 x Post knee in straight  incision and quad in flex x x   Joint mobilization MT p patella x x x x x x x     Manual stretching/PassiveROM MT p flex, ext x X flex in sitting with tibial IR and distraction x x X plus ext in supine x 10 each x Ext/flex in supine and prone X ext in supine, flex in supine and seated x 10 each xx   Ultrasound [place]             Exercise/Activity              Quad set TE 20x5" x x x x x x x  20x10" on 1/2 foam   Heel slides TE x20 with SOS x10 x X 20  x x x Blue pball under heel  With strap  20/5" x With SOS 15x10"   Hip add ball/ Hip abd Tband TE p Ball squeeze 20x5"    UBKF green x20 xx DC ball sq        x  UBKF green x20 x x UBKF  BTB  20/5"   X black x   Bridges with ball squeeze TE p   X 20 x x x W/ ball  x20 DC    March>SLR TE p       SLR  10/3" x    SAQ/LAQ TE x20 ea xx xx X LAQ xx xx xx Both  x20ea xx 1# ea   Prone knee hang TE         2'    Sidelying hip abd/clams TE p            Prone ham curls TE p       x20  2 Towel folded thigh x    Strap Hamstring/Quad stretch TE p   2 x 30" HS X   X  Plus prone quad stretch 2 x 30" xx Calf/ham  2/30sec X HS and prone quad 3 x 30" each x   Stair Stretches TE p stair stretches flex, ext  10 x 5" each x 3x20" ea 10 x 5" 3x20"  ea x xx 10x10" ea   Standing alternating-  marches, ham curls, hip abd, hip ext TA p        X 20 ext, abd with red band    Minisquats/HR TA p   X 20  HR x20 x  x Xx plus mini squats    Tandem>SLS NMR p            Wobbleboard/BAPS NMR             TKE TA p       GTB  10/10"  p   Sidestepping TA p            Ascending/descending stairs reciprocally TA   4" x 5          Forward step ups/Lateral Step ups TA p  FSU 6" x 10   FSU 6" x 10  LSU 6" x 10 X 20 x20 ea 8"  xx Both 8"  x20ea xx    Forward step downs>dips TA p     4" x 20 x FSD 4" x20 x    Cable column/Totalgym TE             LegPress TE             Recumbent bike/elliptical TE p RB seat 12 rocking x8' X seat 11 x Full rev x10' x x M2 seat 11  x10 x BOS   Treadmill/Elliptical TE                           Gait training GT             HEP updated  As above; printout provided    Educated on swelling and elevation            Gait training 04540 GT              Moist Heat/Cold Pack 97010   EStim 97014     MH/CP    ES  20 20 20 10 10 10 10 10     Manual Therapy 97140 MT  20 15 15 15 15 15 10 15 15    Ultrasound 97035  [place]             Therapeutic exercise 97110 TE 25 40 38 30 25 30 30 35 35 40    Therapeutic Activity 97530 TA   15 15 15 15 10 10 15     Neuromuscular Reeducation 97112  NMR             TOTAL TREATMENT TIME  (untimed+timed minutes)  55 80 88 90 65 70 65 65 85 55    TIMED CODE TREATMENT  (timed code  minutes-  shared minutes)  25 60 68 60 55 60 55 55 65 55    ** Medicare-add KX at 5th visit if PT&ST/10h visit if PT only**           Add KX    Therapist's Initials  KR KR AW AW KR AW KR HDB AW KR

## 2018-12-20 NOTE — Progress Notes (Signed)
 [] [facility], [place], [place], Ph [phone]   Fax [phone]    [x] [facility], [place], [place]  Phone [phone]  Fax [phone]    [] 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  He thinks the medication Requip seems to help a bit with the spams.  Whenever he goes to relax he feels the spasms.        Assessment: The patient has been compliant with therapy attendance, along with compliant participation with his home exercise program. He has made moderate change in his functional status and his goal progress can be seen below. Certain goals have not be attained secondary to continuation of severe spasms in his LLE which have limited his range of motion and tolerance to some activities in PT.  Patient is being seen by a neurologist for the same.  HE has demonstrated slow, steady progress with his ROM overall.  At this time, patient is safe and independent with HEP and can continue to make gains outside of formal PT.  Patient is therefore appropriate for discharge to HEP.  Recommended patient work on his flexion and extension ROM daily.      Goals: Status Initial Eval: Status last Re-eval/ Progress: Status last Re-eval/ Progress: Current   Status: Goals Met?      1. The patient will demonstrate independence with HEP. initiated Pt performs exercises 1-2x/day performing performing yes   2. The patient will ambulate with proper mechanics during stance phase of gait with full terminal hip/knee extension and 4/5 strength >200 ft. Antalgic, absent TKE, increased antalgia without AD Uses his cane in the middle of the night if he has to get up; cont mod antalgia and absent TKE (no AD) Progressing - no longer using the cane in the middle of the night  progressing   3. The patient will be able to return to all household and full work activities without pain as demonstrated by LEFS>60. LEFS: 9/80; 89% limitation LEFS: 40/80; 50% limitation LEFS: 44/80 LEFS: 47/80 progressing   4. The patient will ascend/descend 1 flight of stairs with reciprocal pattern secondary to full LE ROM and 5/5 strength. Step-to pattern on stairs with cane Sometimes recpirocal pattern on stairs up, step-to pattern down Ascends reciprocally, descends step-to pattern that's pretty rough Descending still rough and painful progressing   5. The patient will be able to stand for 60 minutes without pain secondary to strength 5/5 in order to perform woodworking.     Unable to stand for a long time Able to stand 45 min without pain It gets uncomfortable Gets stiff after an hour yes     Functional Outcome Measures:  LEFS score 9/80; 89% limitation to 40/80; 50% limitation to 44/80 to 47/80    Girth Measurements:   joint line 10 cm above jt line 10 cm below jt line   Left 37.6 cm to 37.2 cm 37.4 cm to 36.8 cm 31.6 cm to 30.9 cm     ROM / Strength  [] ??Unable to assessAROM                           PROM               Strength (1-5)    Left 11/03/18 Right L 3/11 L 3/30 Left 11/03/18 12/01/18 L 3/30 Left 11/03/18 Right 11/03/18   Hip Flexion          4 5 4 5     Abduction          4 4 4  4+    Adduction          4+ 4+ 4+ 5   Knee Flexion 72  100 132 105 112 75 105 107 115 3- 4+** 5 5    Extension -3 -3 0 -3      3- 4+** 5 5   Ankle Plantarflexion          5 5 5 5     Dorsiflexion          4 4+ 5 5    Inversion          4+ 5 5 5     Eversion          4+ 5 5 5    [] ??all WNLs/WFLs [] ??all WNLs/WFLs [] ??all at least 4/5  **within available range.     Rhomberg EC:30 sec    Tandem L: 30 sec, mod sway  Tandem R: 30 sec, min sway    SLS L: 30 sec, mod sway, pain  SLS R: 30 sec, min sway      Plan:  Discharge to HEP.             [name] 12/20/2018 8:48 AM  ______________________________________________________________________  I certify that the above Therapy Services are being furnished while the patient is under my care. I agree with the treatment plan and certify that this therapy is necessary.  []  I have read the above and request that my patient continue as recommended  []  I have read the above report and request that my patient continue therapy with the following changes/special instructions:   []  I have read the above report and request that my patient be discharged from therapy  Physician's Signature:________________________Date:________Time:_________  Please sign and FAX back to appropriate hospital.

## 2019-01-20 ENCOUNTER — Encounter

## 2019-02-08 ENCOUNTER — Inpatient Hospital Stay: Admit: 2019-02-08 | Payer: MEDICARE | Attending: Orthopaedic Surgery | Primary: Family Medicine

## 2019-02-08 DIAGNOSIS — M4726 Other spondylosis with radiculopathy, lumbar region: Secondary | ICD-10-CM

## 2020-02-07 IMAGING — NM THREE PHASE BONE SCAN
10 series · 23 of 23 positions shown · non-contrast
Comparison: Radiographs of 06/22/2019.

THREE PHASE BONE SCAN, 02/07/2020 [DATE]: 
CLINICAL INDICATION:  Left knee pain status post TKR in 9153.
TECHNIQUE: After the intravenous administration of 27.8 mCi of FRAGA FcLLm MDP, 
blood flow, blood pool and delayed images were obtained over the knees. In 
addition whole-body images were obtained.

[Series 80: flow · 9.04mm/px · 6 of 75 frames shown]
[frame 7/75  full-range]
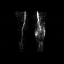
[frame 19/75  full-range]
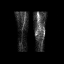
[frame 32/75  full-range]
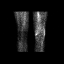
[frame 44/75  full-range]
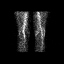
[frame 57/75  full-range]
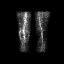
[frame 69/75  full-range]
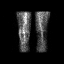

[Series 81: (person_name) ant/post · 2.26mm/px · 1 of 1 slices shown]
[im 1/1]
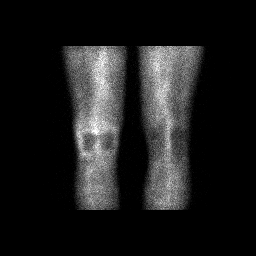

[Series 82: (person_name) laterals · 2.26mm/px · 2 of 2 frames shown]
[frame 1/2]
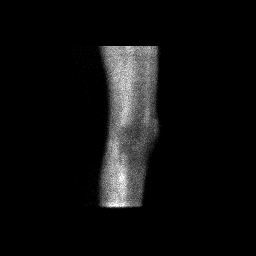
[frame 2/2]
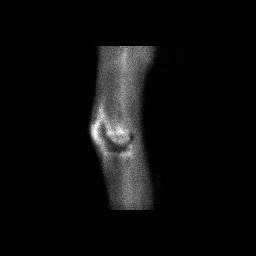

[Series 95: whole body · 2.26mm/px · 2 of 2 frames shown]
[frame 1/2]
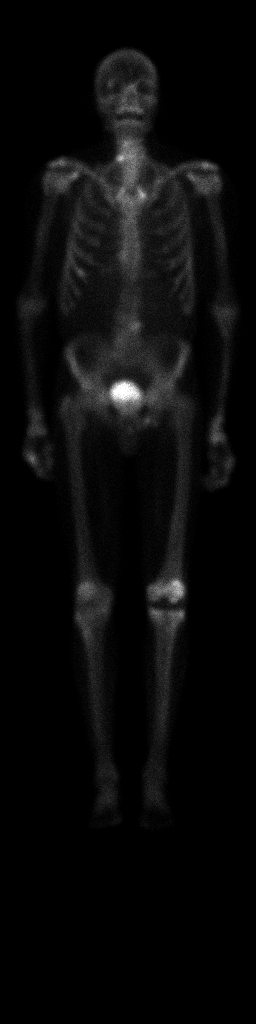
[frame 2/2]
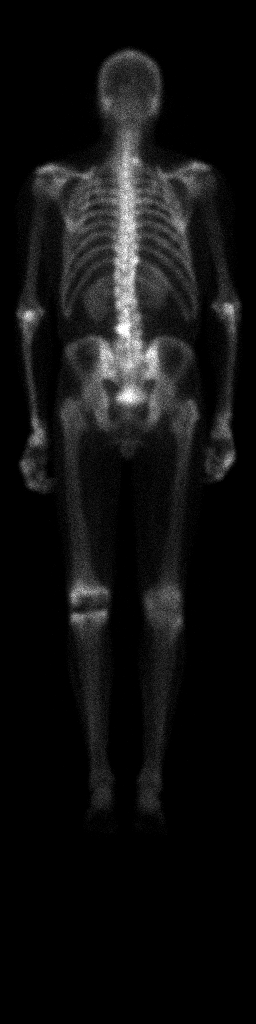

[Series 96: rao/lpo · 2.26mm/px · 2 of 2 frames shown]
[frame 1/2]
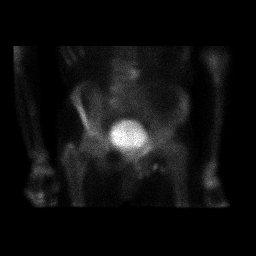
[frame 2/2]
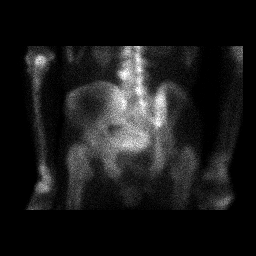

[Series 97: lao/rpo · 2.26mm/px · 2 of 2 frames shown]
[frame 1/2]
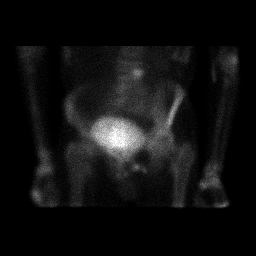
[frame 2/2]
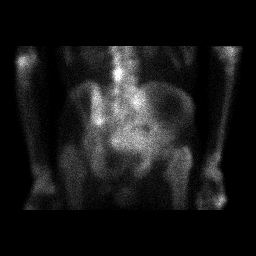

[Series 98: delay ant/post · delayed · 2.26mm/px · 2 of 2 frames shown]
[frame 1/2]
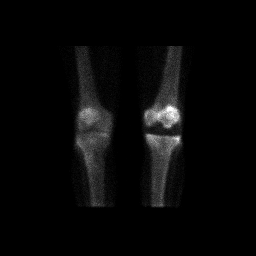
[frame 2/2]
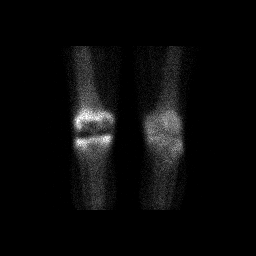

[Series 99: delay laterals · delayed · 2.26mm/px · 2 of 2 frames shown]
[frame 1/2]
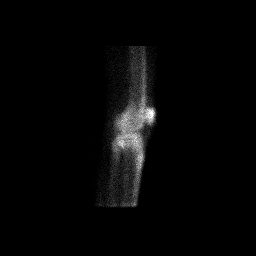
[frame 2/2]
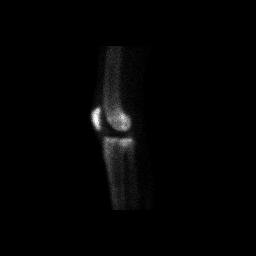

[Series 100: rao/(id) · 2.26mm/px · 2 of 2 frames shown]
[frame 1/2]
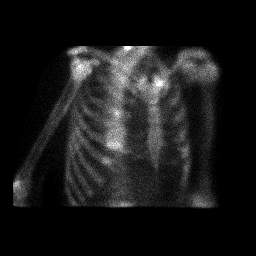
[frame 2/2]
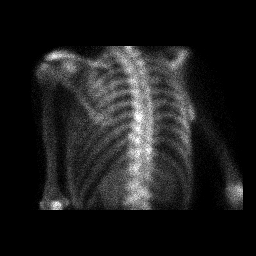

[Series 101: lao/(id) · 2.26mm/px · 2 of 2 frames shown]
[frame 1/2]
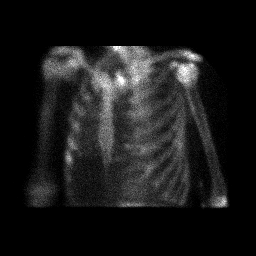
[frame 2/2]
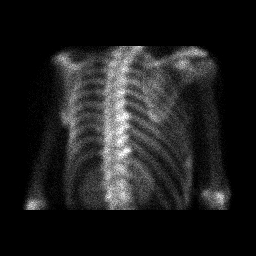

[23 of 23 positions shown; findings below may reference images not displayed]

FINDINGS: There is increased radiotracer accumulation about the left knee 
periphery on the blood flow series. There is additional increased homogenous 
mild radiotracer accumulation on the delayed views about both the tibial and 
femoral components and patellar resurfacing. Mild scattered degenerative type 
uptake involving the shoulders, sternoclavicular regions, hands and wrists and 
right knee. Single lower anterior right rib uptake suggestive for a fracture. 
Normal soft tissue uptake.
IMPRESSION: Nonspecific positive triple phase bone scan. Radiotracer uptake about the left 
knee is a nonspecific finding and can be seen with infectious/inflammatory 
processes as well as additional etiologies such as mechanical loosening or 
osteolysis. Consideration could be made for CT or MR exam with metal 
suppression.

## 2021-03-06 IMAGING — MR MRI LEFT SHOULDER WITHOUT CONTRAST
5 of 7 series · 23 of 40 positions shown · IV contrast (gadolinium)
Comparison: 3 phase bone scan of 02/07/2020. Radiographs of 02/21/2021.

FINAL Diagnostic Imaging Report 
________________________________________________________________________________________________ 
MRI LEFT SHOULDER WITHOUT CONTRAST, 03/06/2021 [DATE]: 
CLINICAL INDICATION: Concern for rotator cuff tear. Weakness on exam. Pain for 
several months.
TECHNIQUE: Multiplanar, multiecho position MR images of the shoulder were 
performed without intravenous gadolinium enhancement. 
Patient was scanned on a 1.5T magnet.

[Series 101: survey_fullfov_transversal · axial · 10.0mm · 1.84mm/px · 1 of 7 slices shown]
[im 1/7]
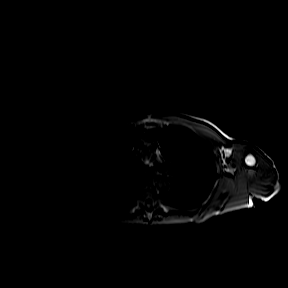

[Series 201: survey mst · axial · 10.0mm · 0.99mm/px · z∈[-40,+175]mm · 4 of 15 slices shown]
[im 1/15]
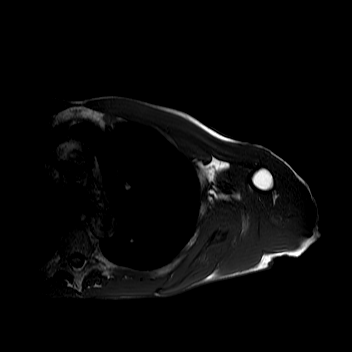
[im 5/15]
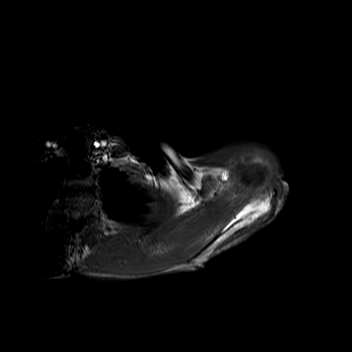
[im 10/15]
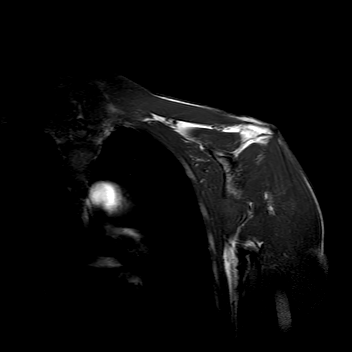
[im 15/15]
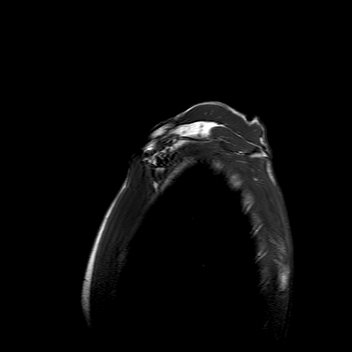

[Series 301: (person_name)_(person_name)_(person_name)* · axial · 3.0mm · 0.35mm/px · z∈[-40,+49]mm · 7 of 28 slices shown]
[im 1/28]
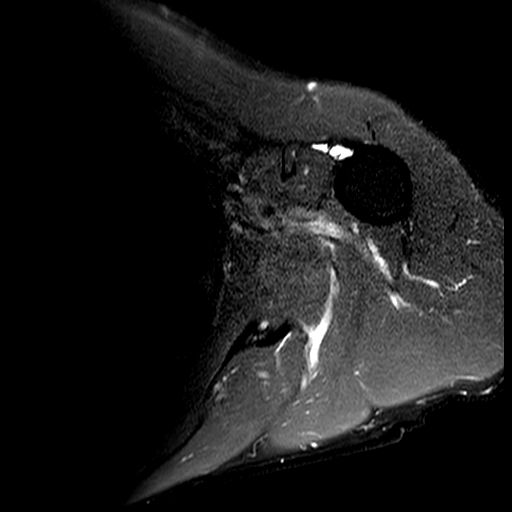
[im 5/28]
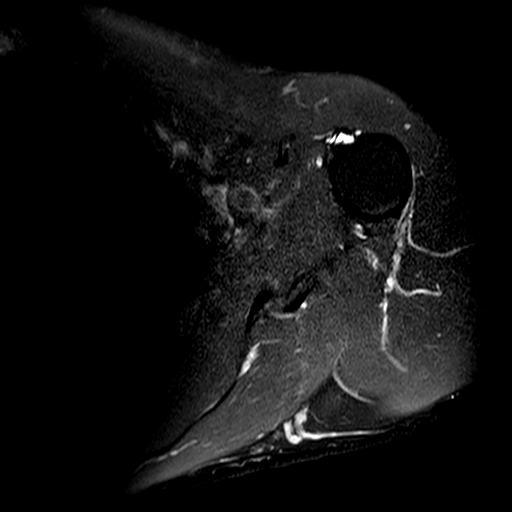
[im 10/28]
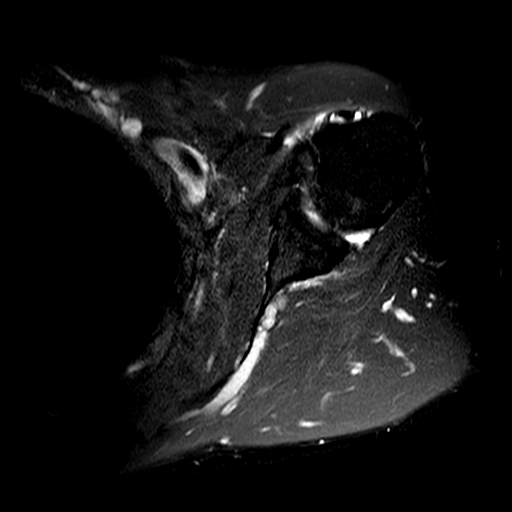
[im 14/28]
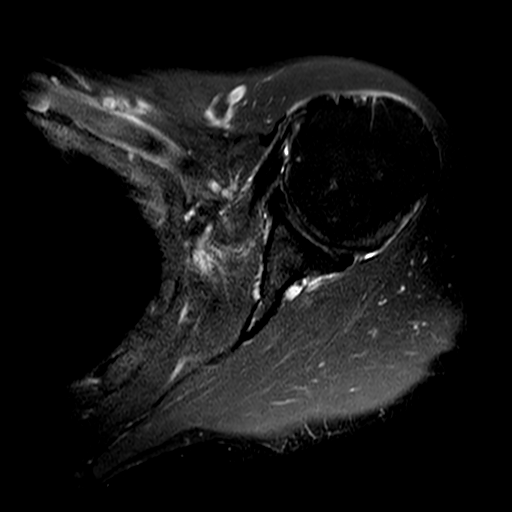
[im 19/28]
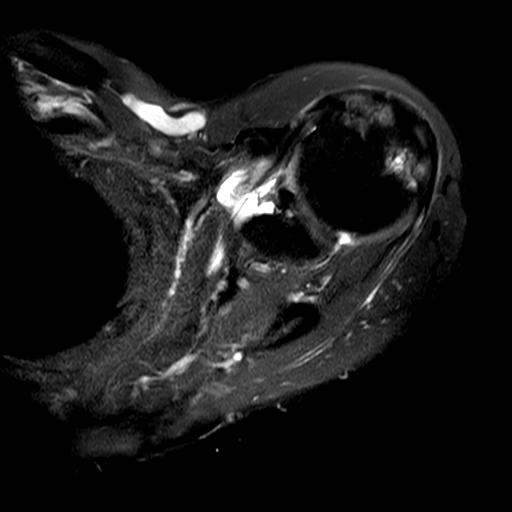
[im 23/28]
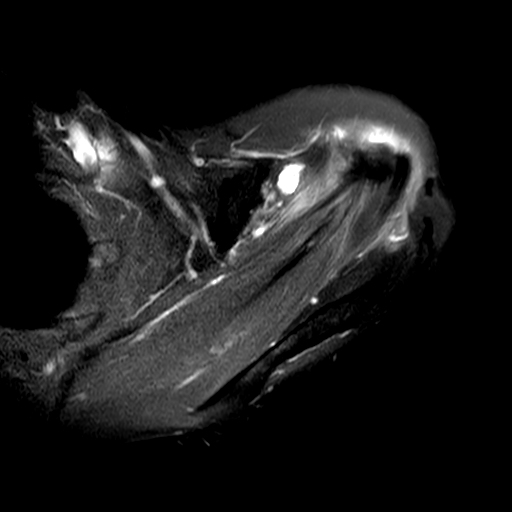
[im 28/28]
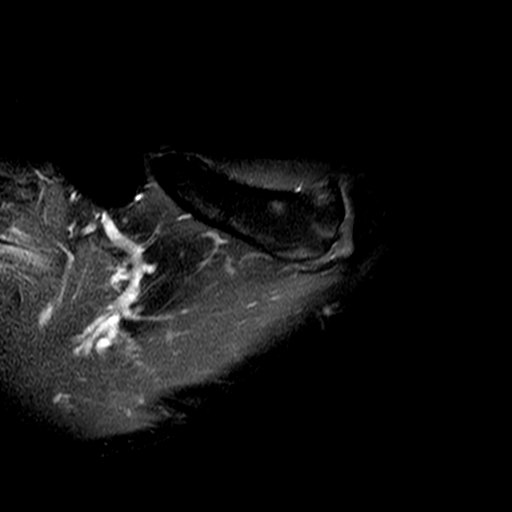

[Series 401: t2_fs_sag* · oblique · 3.0mm · 0.37mm/px · 8 of 30 slices shown]
[im 1/30]
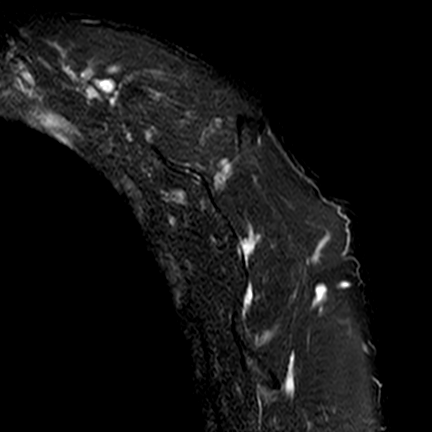
[im 5/30]
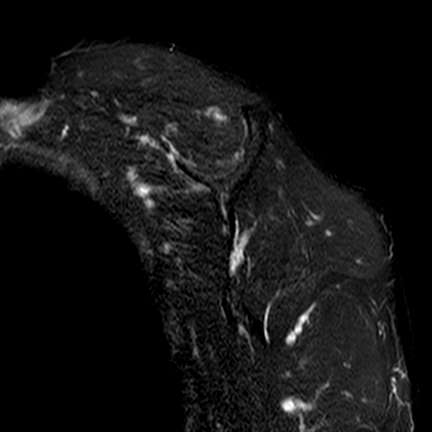
[im 9/30]
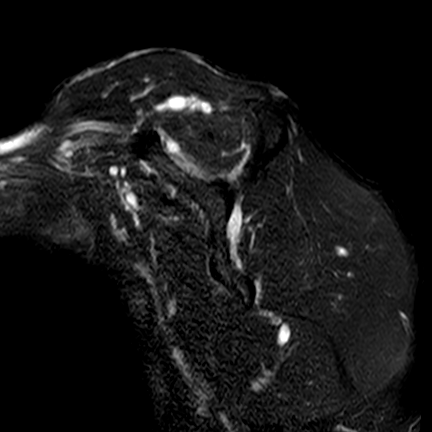
[im 13/30]
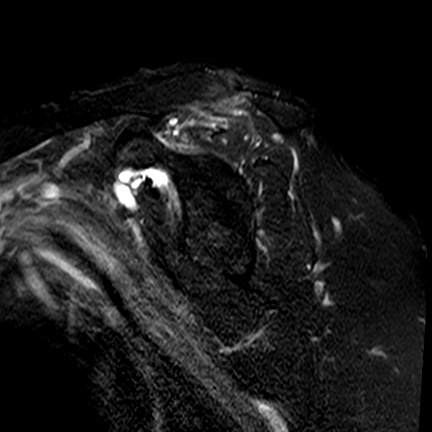
[im 17/30]
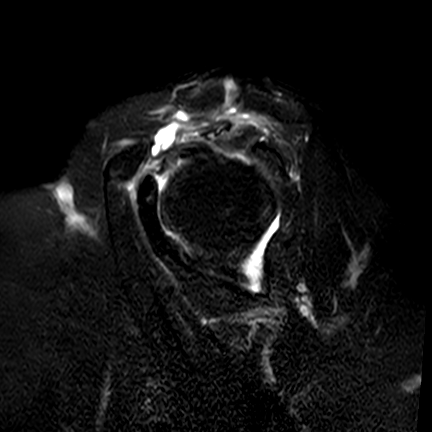
[im 21/30]
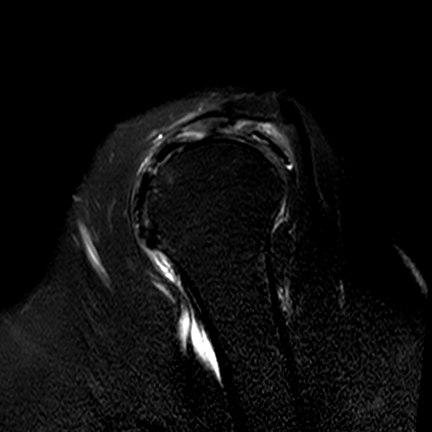
[im 25/30]
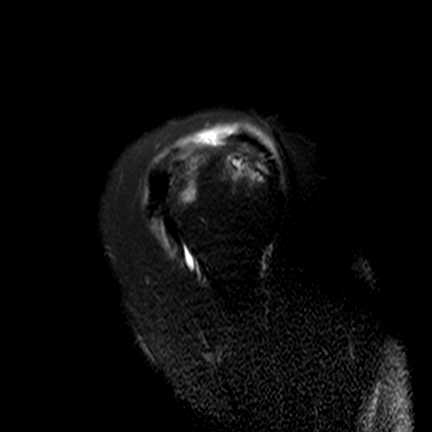
[im 30/30]
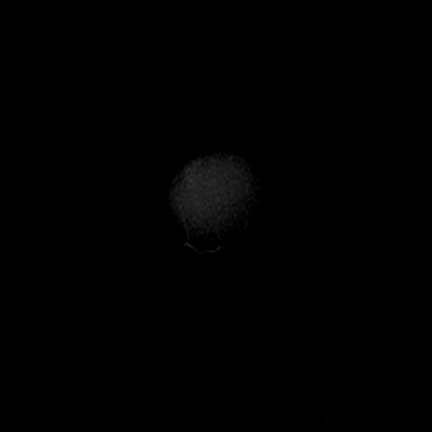

[Series 501: t1_sag* · oblique · 3.0mm · 0.29mm/px · 3 of 30 slices shown]
[im 1/30]
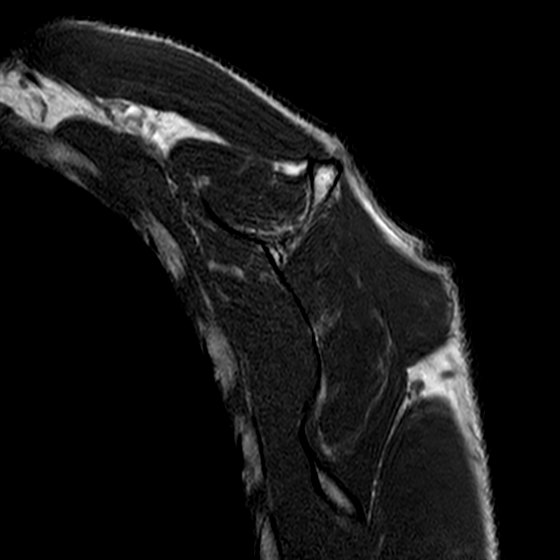
[im 5/30]
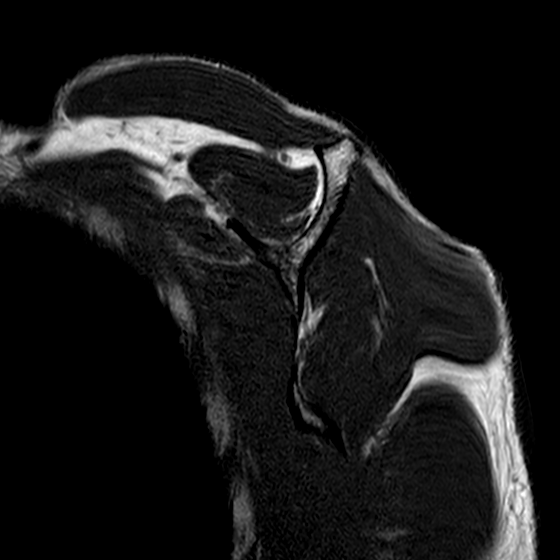
[im 9/30]
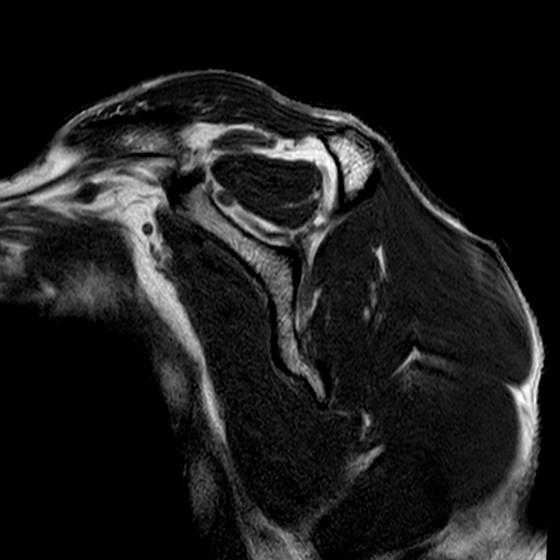

[23 of 40 positions shown; findings below may reference images not displayed]

FINDINGS: ROTATOR CUFF: There is full-thickness tear of the majority of the supraspinatus 
measuring approximately 12 mm AP. There is a tendinous gap of approximately 1 cm 
as seen for example on the coronal series, image 13. There are a few intact 
fibers both anteriorly and posteriorly of the supraspinatus. There is mild 
edema-like signal intensity within the supraspinatus muscle. There is low-grade 
partial tearing of the superior insertion of the subscapularis. The 
infraspinatus and teres minor tendons and muscles are preserved. No asymmetric 
atrophy/fatty infiltration of the rotator cuff musculature. 
ACROMIOCLAVICULAR JOINT: Moderate hypertrophic changes of the AC joint. There is 
mild mass effect upon the underlying myotendinous juncture of the supraspinatus. 
There is partial tearing of the acromioclavicular ligament. 
GLENOHUMERAL JOINT: Moderately advanced articular cartilaginous loss of the 
glenohumeral articulation. Multifocal degenerative tearing/fraying of the 
labrum. 2 mm subcortical cyst anterior superior glenoid. The intra-articular 
portion of the long head of the biceps tendon is negative. Trace shoulder joint 
effusion. Fluid extends through the rotator cuff defect into the 
subacromial-subdeltoid space. 
BONES: The bone marrow signal intensity is negative for fracture. No Hill-Sachs 
defect. Subcortical cystic change of the humeral head. 
ADDITIONAL FINDINGS: The axillary region is negative. Subcutaneous tissues are 
negative.
IMPRESSION: 1.  Full-thickness tear of the majority of the supraspinatus tendon without 
significant tendon retraction or muscular atrophy. 
2.  Moderate AC joint arthropathy with narrowing of the supraspinatus outlet. 
3.  Moderate degenerative changes of the glenohumeral articulation.

## 2021-07-03 IMAGING — MR MRI BRACHIAL PLEXUS WITHOUT CONTRAST
4 of 6 series · 17 of 48 positions shown · IV contrast (gadolinium)
Comparison: MRI left shoulder March 06, 2021.

________________________________________________________________________________________________ 
MRI BRACHIAL PLEXUS WITHOUT CONTRAST, 07/03/2021 [DATE]: 
CLINICAL INDICATION: 70-year-old male with left brachial plexus lesion by nerve 
studies. Numbness in the left forearm and the 1st-5rd digit numbness. History of 
left rotator cuff repair. Evaluate for left C7 foraminal stenosis.
TECHNIQUE: Multiplanar, multiecho position MR images of the brachial plexus were 
performed without intravenous gadolinium enhancement. Patient was scanned on a 
1.5T magnet.

[Series 201: survey mst · axial · 10.0mm · 0.89mm/px · z∈[-45,+195]mm · 5 of 17 slices shown]
[im 1/17]
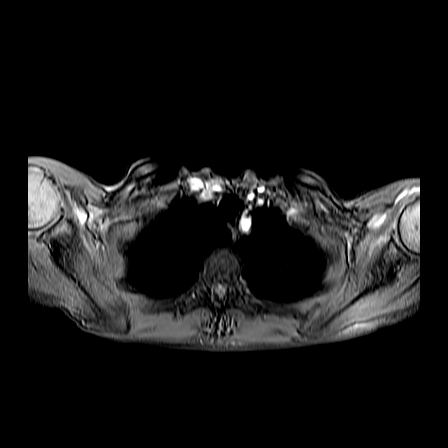
[im 5/17]
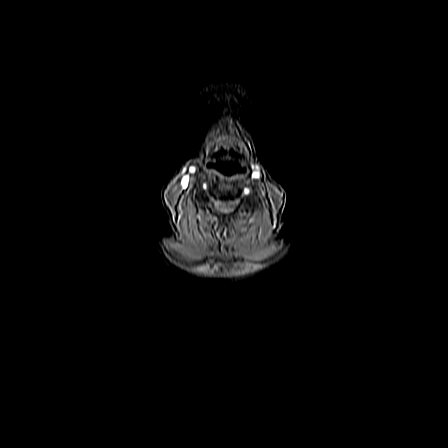
[im 9/17]
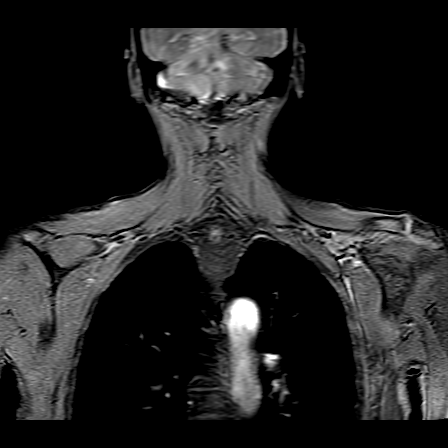
[im 13/17]
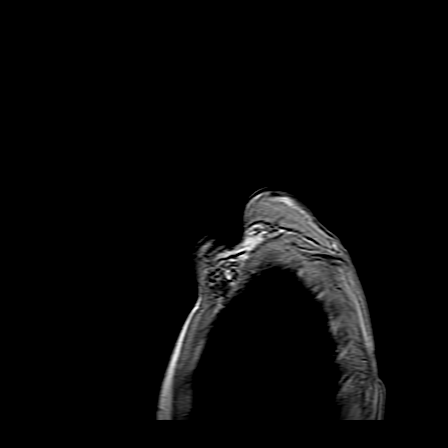
[im 17/17]
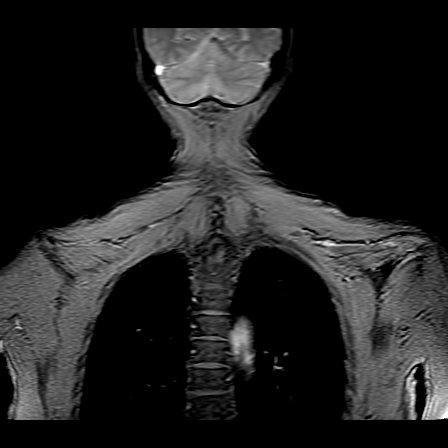

[Series 301: (id)/spine count · sagittal · 3.0mm · 0.47mm/px · 3 of 11 slices shown]
[im 1/11]
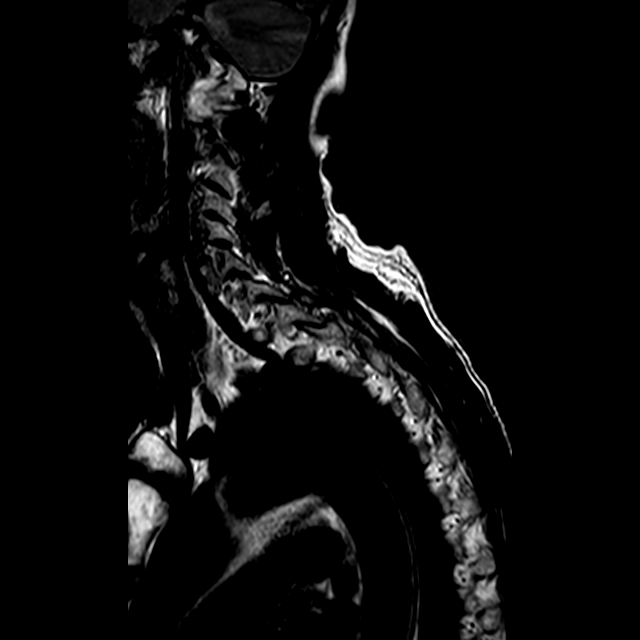
[im 6/11]
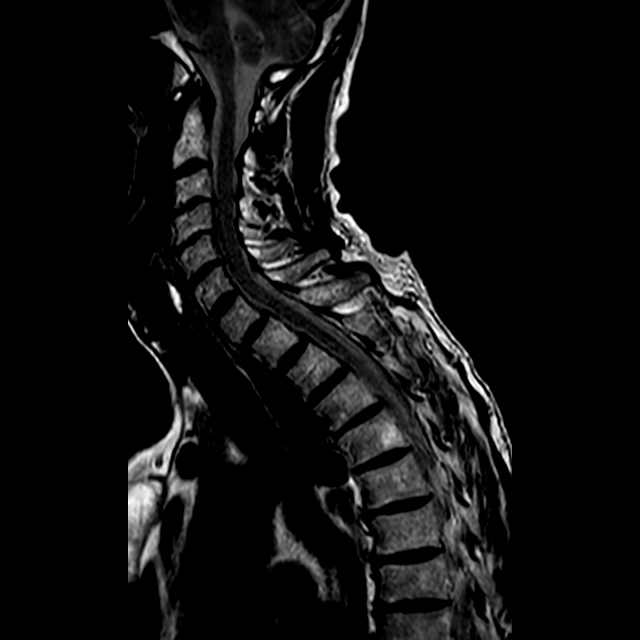
[im 11/11]
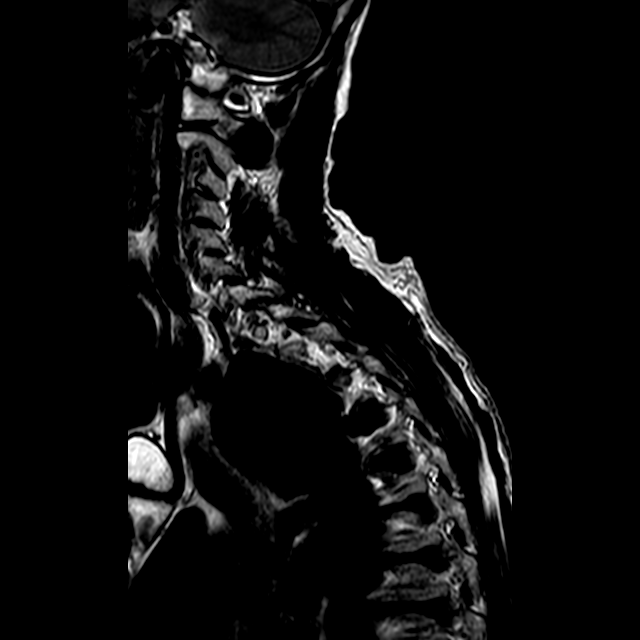

[Series 401: stir_(person_name) fov · axial · 4.0mm · 0.80mm/px · z∈[-150,-16]mm · 6 of 36 slices shown]
[im 1/36]
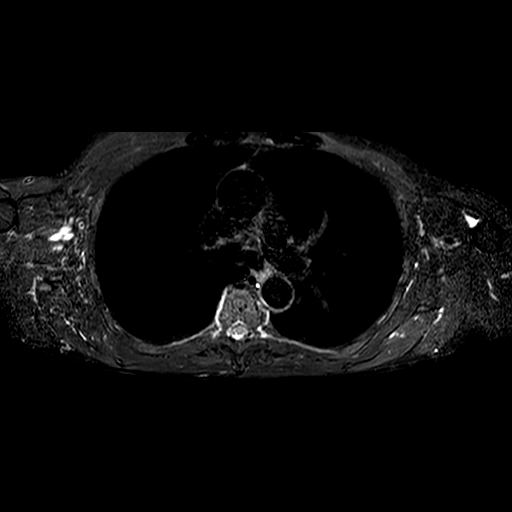
[im 4/36]
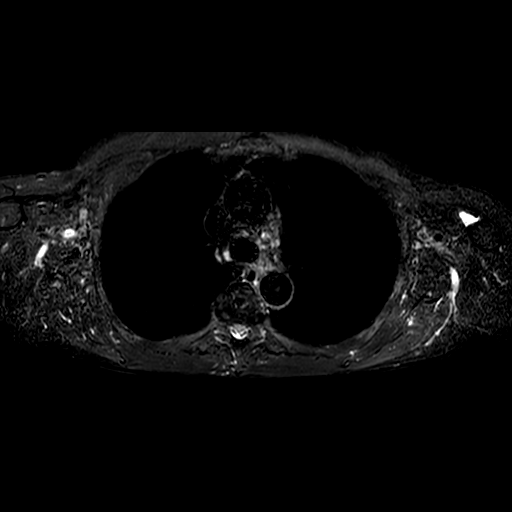
[im 12/36]
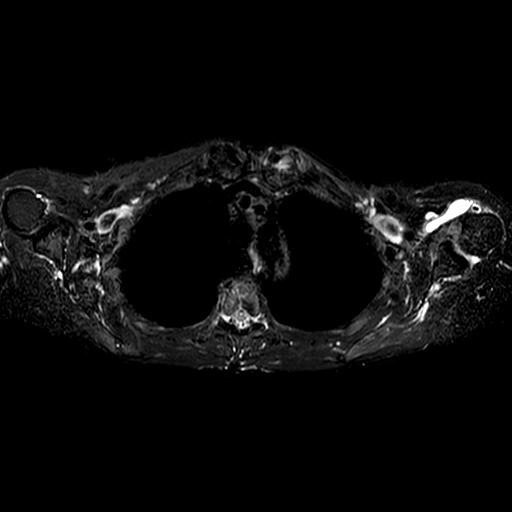
[im 16/36]
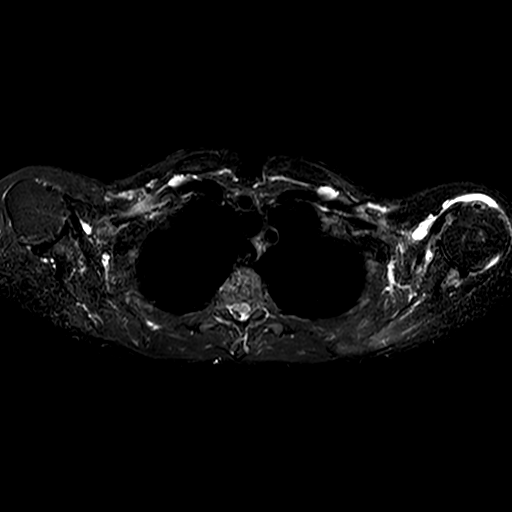
[im 20/36]
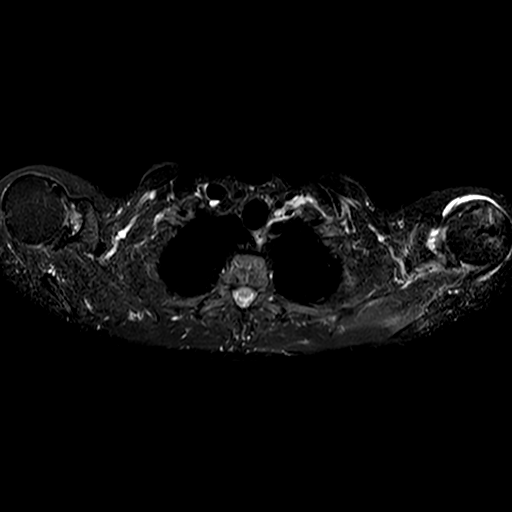
[im 32/36]
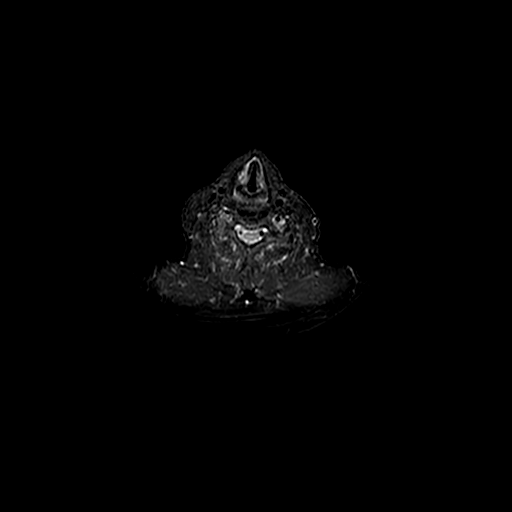

[Series 601: PD · sagittal · 5.0mm · 0.45mm/px · 3 of 38 slices shown]
[im 5/38]
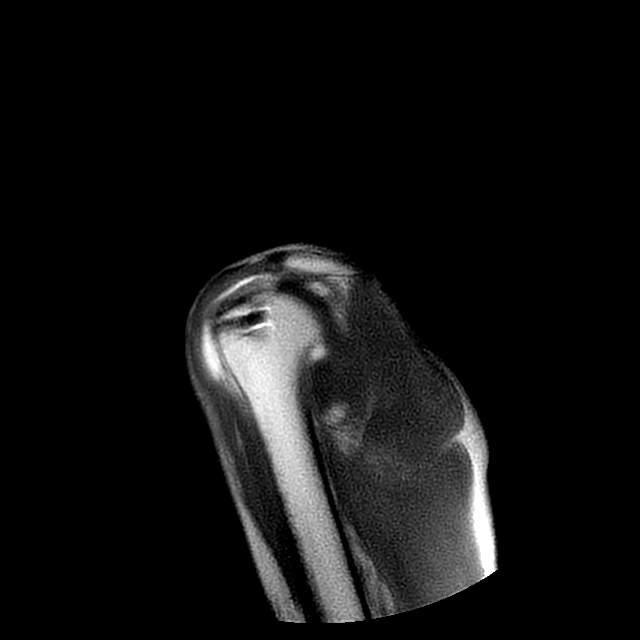
[im 21/38]
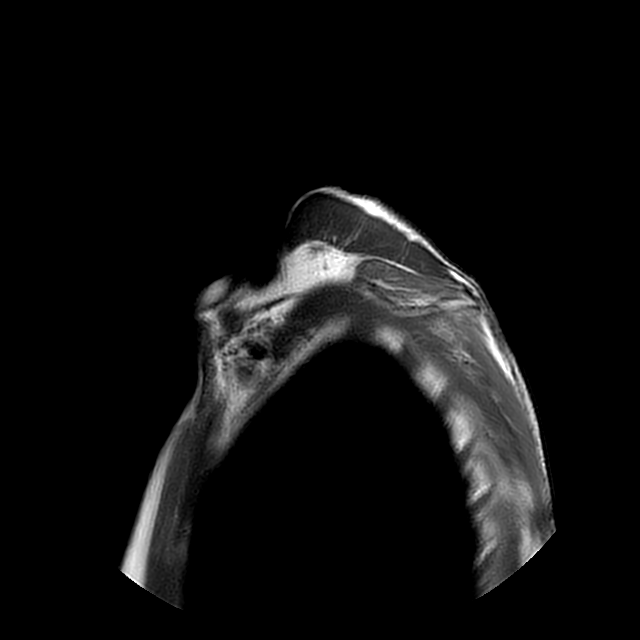
[im 33/38]
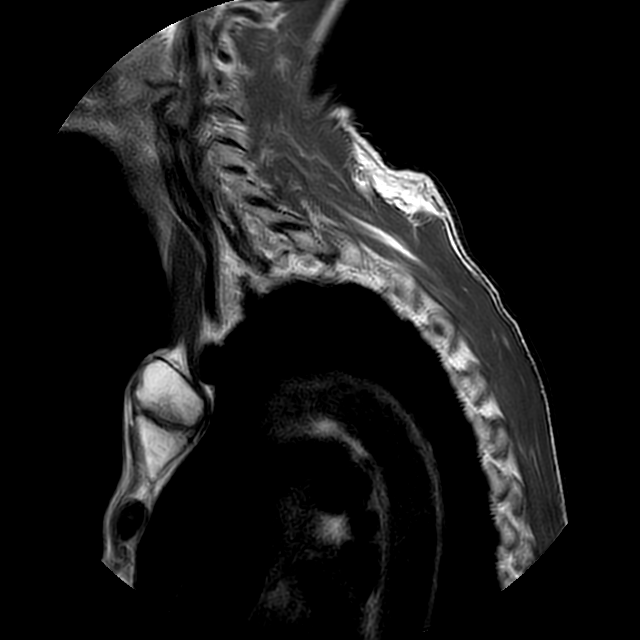

[17 of 48 positions shown; findings below may reference images not displayed]

FINDINGS: There is susceptibility in the region of the left humeral head 
consistent with provided history of left rotator cuff repair. Step wise grade 1 
retrolisthesis C3 on C4, C4 on C5 and C5 on C6 with mild degrees of central 
canal stenosis C3-C4, C4-C5 and C5-C6 levels. The neural foramina within the mid 
cervical spine are difficult to evaluate, but there certainly could be 
significant bilateral foraminal stenosis at these levels.. Left C6-C7 and C7-T1 
and left T1-T2 neural foramina appear patent. The thoracic neural foramina 
inferiorly also appear patent. No significant thoracic central canal stenosis. 
SPINAL CORD: Normal caliber and signal of the spinal cord. 
BRACHIAL PLEXUS: 
ROOTS: Normal. 
TRUNKS: Normal. 
DIVISIONS: Normal. 
CORDS: Normal. 
BRANCHES: Normal. 
MUSCLES AND TENDONS: Normal scalene muscles. 
BONES: No cervical ribs. C7 transverse processes are normal. Normal marrow 
signal intensity. 
JOINTS: Normal sternoclavicular joints. There is a moderate size joint effusion 
within the subacromial bursa. There is extension of fluid into the left 
acromioclavicular joint. Suture anchors consistent with previous rotator cuff 
repair. 
THORACIC OUTLET/CERVICOTHORACIC JUNCTION: Normal interscalene triangle, 
costoclavicular space and retropectoralis minor space. 
OTHER FINDINGS: No vascular abnormality, mass or other significant finding.
IMPRESSION: Unremarkable MRI of the brachial plexus. However, there is significant fluid 
within the left glenohumeral joint with extension cephalad into the left 
acromioclavicular joint. This could certainly create some degree of mass effect 
in this region. Consider dedicated MRI of the left shoulder, as clinically 
indicated, to further evaluate. 
Additionally, there is suggestion for mild degrees of central canal stenosis 
from C3-C4 through C5-C6 levels. The foramina at these levels are difficult to 
evaluate, but there certainly could be significant bilateral foraminal stenosis 
at these levels. The left C6-C7 and C7-T1 and left T1-T2 neural foramina appear 
patent.

## 2021-07-09 IMAGING — MR MRI CERVICAL SPINE WITHOUT CONTRAST
4 of 7 series · 15 of 48 positions shown · IV contrast (gadolinium)
Comparison: None

________________________________________________________________________________________________ 
MRI CERVICAL SPINE WITHOUT CONTRAST, 07/09/2021 [DATE]: 
CLINICAL INDICATION: Left forearm numbness
TECHNIQUE: Multiplanar, multiecho position MR images of the cervical spine were 
performed without intravenous gadolinium enhancement. Patient was scanned on a 
1.5T magnet.

[Series 201: t2w_cor-surv · coronal · 5.0mm · 0.85mm/px · 4 of 10 slices shown]
[im 1/10]
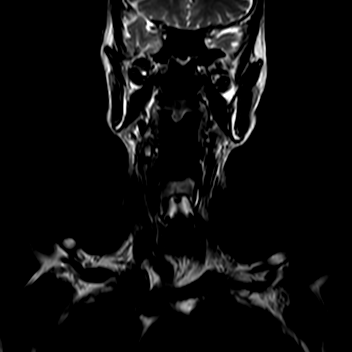
[im 4/10]
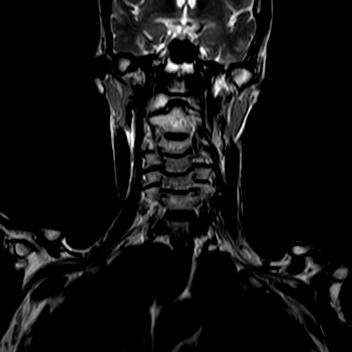
[im 7/10]
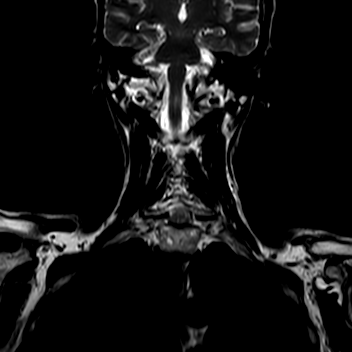
[im 10/10]
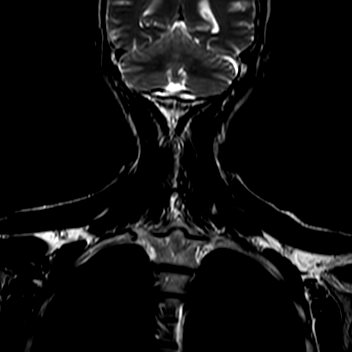

[Series 301: t1_sag · sagittal · 3.0mm · 0.36mm/px · 5 of 15 slices shown]
[im 1/15]
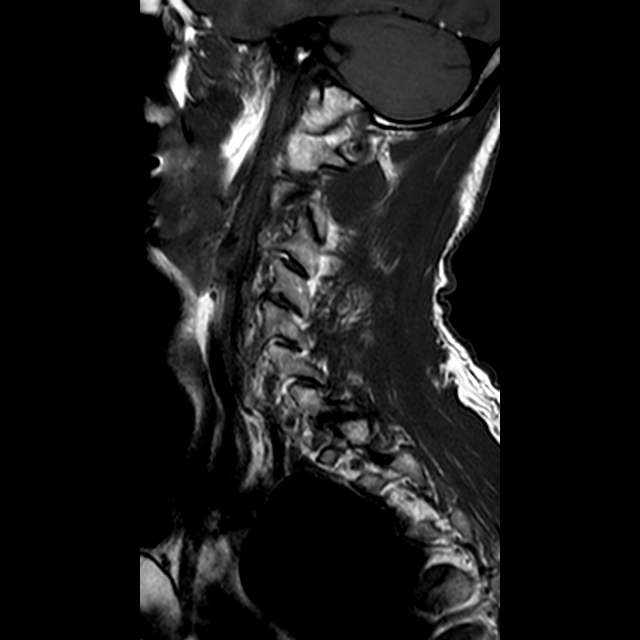
[im 4/15]
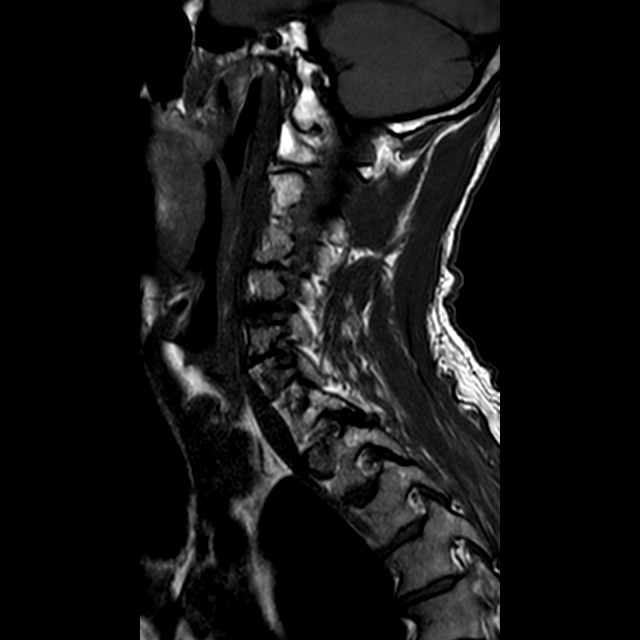
[im 8/15]
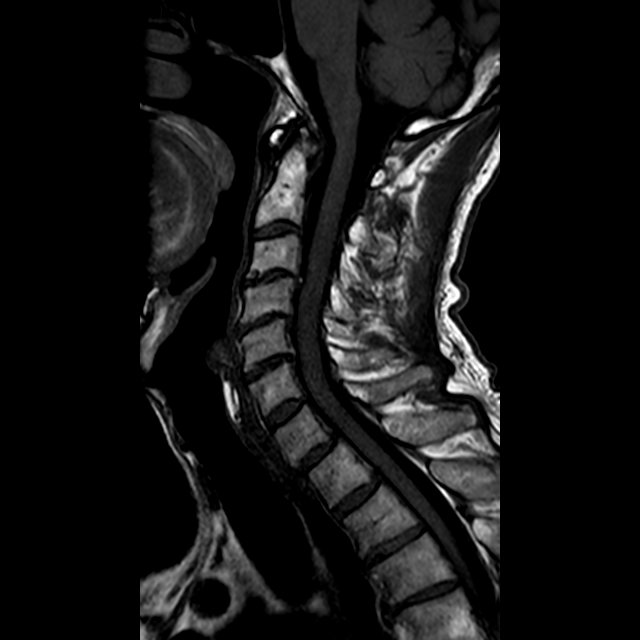
[im 11/15]
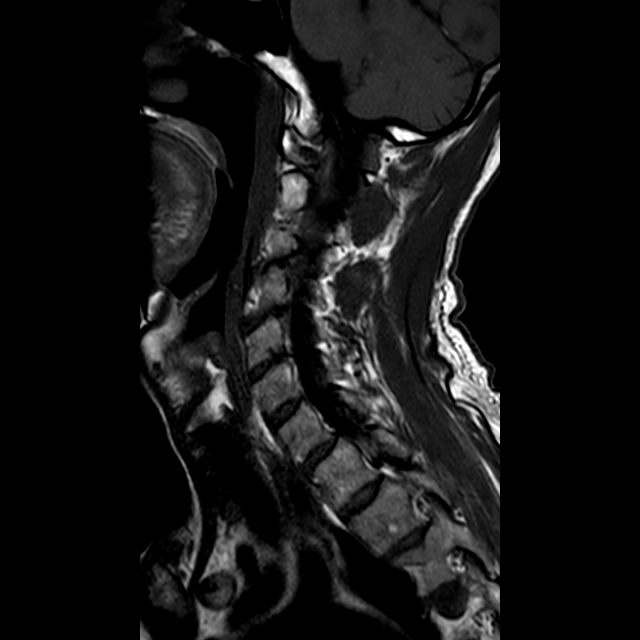
[im 15/15]
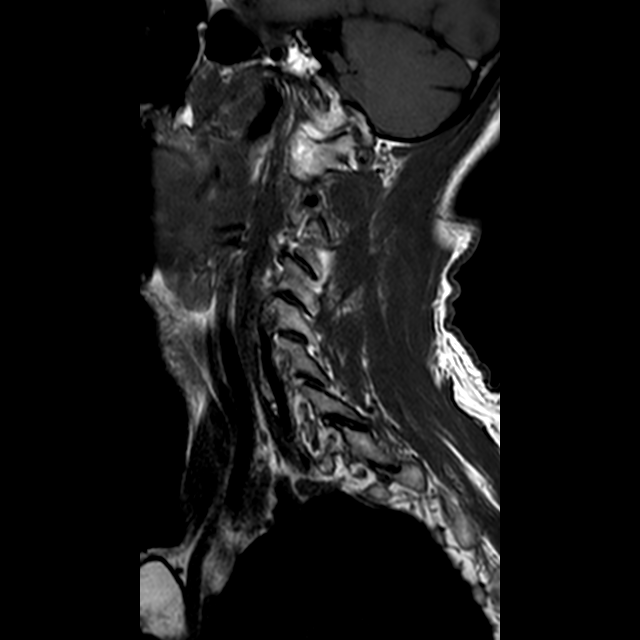

[Series 401: t2w_mv_xd_sag · sagittal · 3.0mm · 0.31mm/px · 3 of 15 slices shown]
[im 1/15]
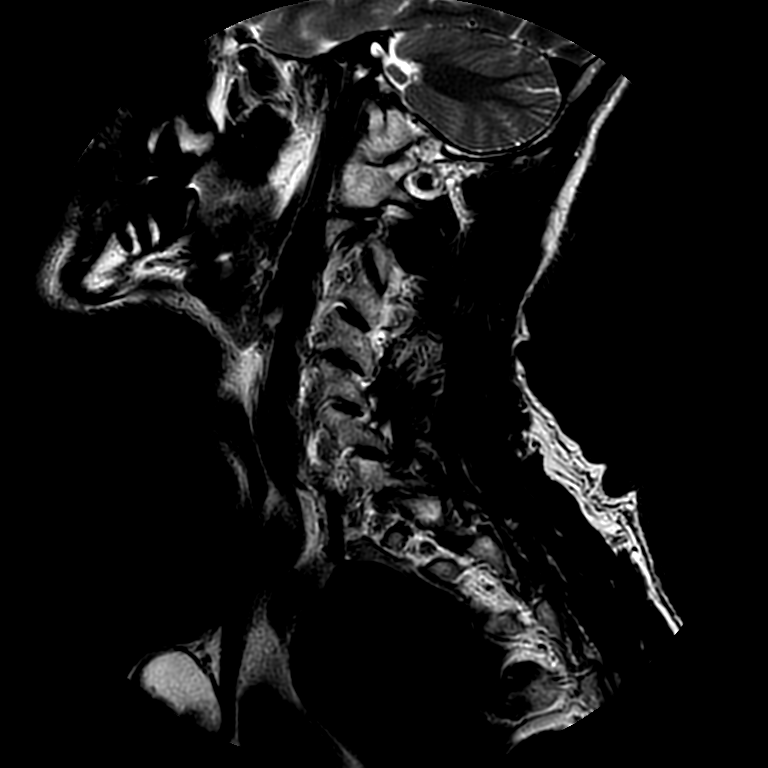
[im 10/15]
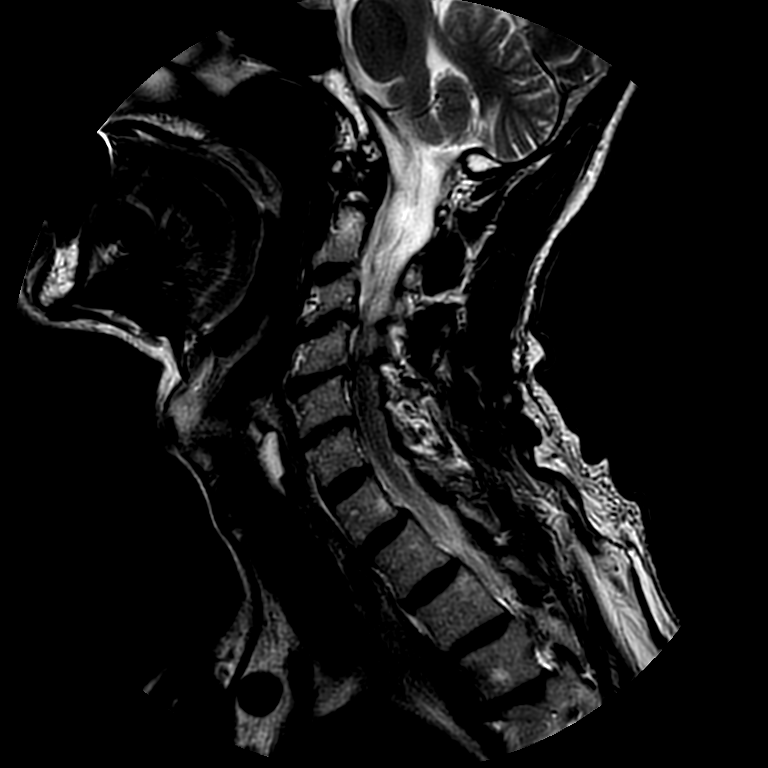
[im 15/15]
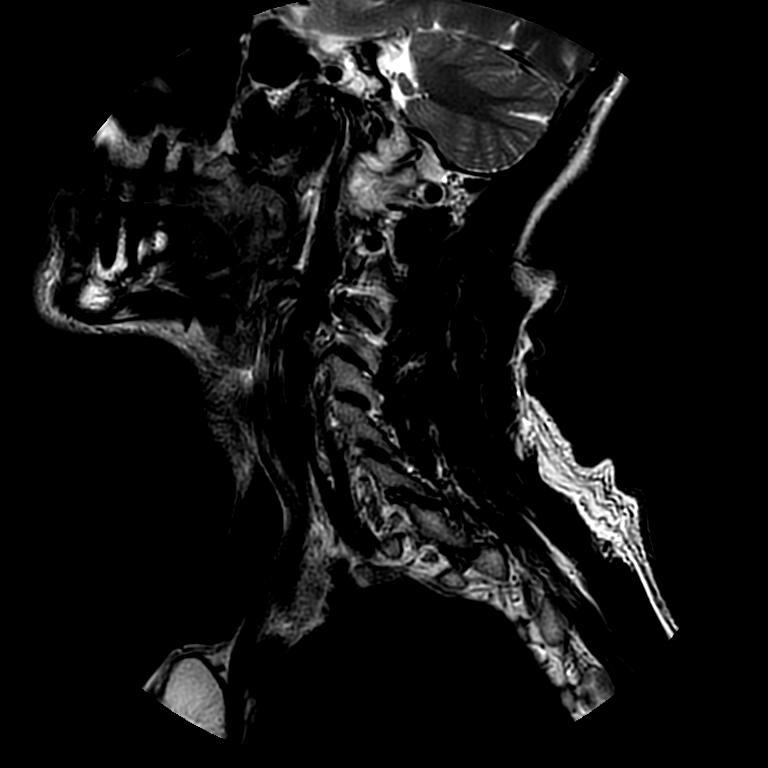

[Series 501: stir_sag · sagittal · 3.0mm · 0.45mm/px · 3 of 15 slices shown]
[im 1/15]
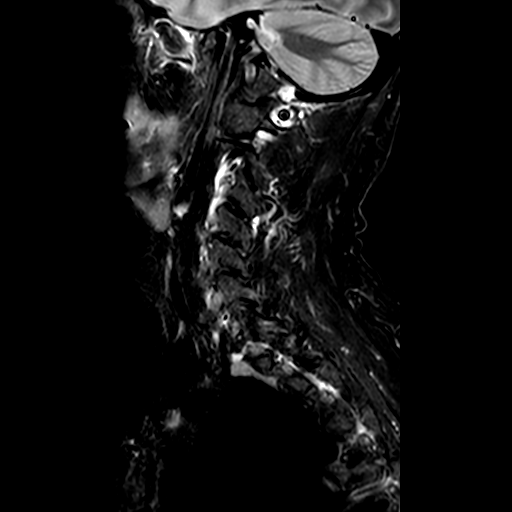
[im 10/15]
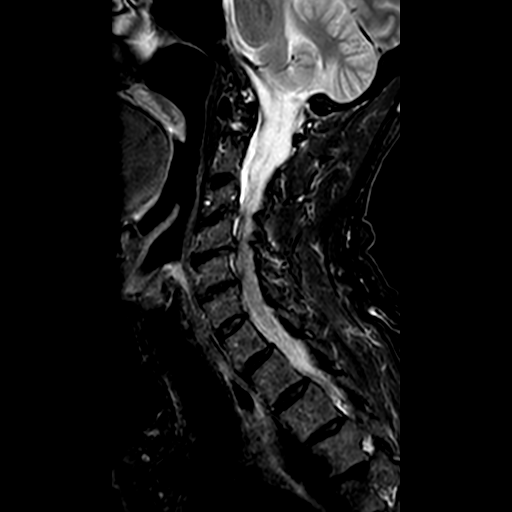
[im 15/15]
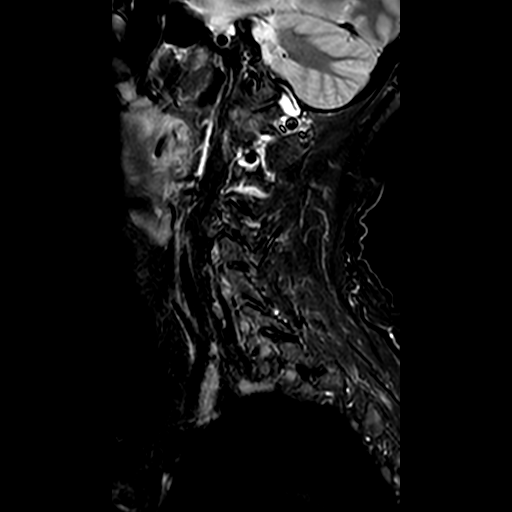

[15 of 48 positions shown; findings below may reference images not displayed]

FINDINGS: Cervical vertebral heights are intact. There is spondylosis, with 
marked disc narrowing from C3 through C6. There is 2 mm anterolisthesis of C7 on 
T1 and T1 on T2. 
Disc-osteophyte mildly indents the ventral cord at C3-4, canal diameter 7 mm. 
There is slight deformity of the ventral cord by disc-osteophyte at C4-5, canal 
diameter 8 mm. At C5-6 disc-osteophyte approximates the cord without deformity, 
canal diameter 9 mm. Canal is open elsewhere in the cervical spine. 
Axial images show marked right, moderate left foraminal stenosis at C3-4. Marked 
right, moderate left C4-5 foraminal stenosis. Marked right, mild to moderate 
left C5-6 foraminal stenosis. Other foramina appear open. 
The dens is intact. There is mild to moderate atlantoaxial degenerative change. 
Craniocervical junction is open. Cord signal appears normal. No paraspinal mass.
IMPRESSION: Spondylosis as described. There is mild deformity of the ventral cord at C3-4, 
slight deformity at C4-5 due to disc-osteophyte. Mild to moderate canal stenosis 
at C3-4, mild at C4-5 and C5-6. 
Multilevel foraminal stenosis as described. 
No evidence for fracture or malignancy.
